# Patient Record
Sex: Female | Born: 1965 | Race: White | Hispanic: No | Marital: Married | Smoking: Former smoker
Health system: Southern US, Community
[De-identification: ages and names within clinical notes are randomized; demographics above are authoritative.]

## PROBLEM LIST (undated history)

## (undated) DIAGNOSIS — T8859XA Other complications of anesthesia, initial encounter: Secondary | ICD-10-CM

## (undated) DIAGNOSIS — T4145XA Adverse effect of unspecified anesthetic, initial encounter: Secondary | ICD-10-CM

## (undated) DIAGNOSIS — E785 Hyperlipidemia, unspecified: Secondary | ICD-10-CM

## (undated) DIAGNOSIS — Z9689 Presence of other specified functional implants: Secondary | ICD-10-CM

## (undated) DIAGNOSIS — G4730 Sleep apnea, unspecified: Secondary | ICD-10-CM

## (undated) DIAGNOSIS — I639 Cerebral infarction, unspecified: Secondary | ICD-10-CM

## (undated) DIAGNOSIS — G43909 Migraine, unspecified, not intractable, without status migrainosus: Secondary | ICD-10-CM

## (undated) DIAGNOSIS — Z9889 Other specified postprocedural states: Secondary | ICD-10-CM

## (undated) DIAGNOSIS — K219 Gastro-esophageal reflux disease without esophagitis: Secondary | ICD-10-CM

## (undated) DIAGNOSIS — I749 Embolism and thrombosis of unspecified artery: Secondary | ICD-10-CM

## (undated) DIAGNOSIS — R112 Nausea with vomiting, unspecified: Secondary | ICD-10-CM

## (undated) DIAGNOSIS — F17200 Nicotine dependence, unspecified, uncomplicated: Secondary | ICD-10-CM

## (undated) DIAGNOSIS — I2510 Atherosclerotic heart disease of native coronary artery without angina pectoris: Secondary | ICD-10-CM

## (undated) HISTORY — PX: SPINAL CORD STIMULATOR IMPLANT: SHX2422

## (undated) HISTORY — DX: Gastro-esophageal reflux disease without esophagitis: K21.9

## (undated) HISTORY — PX: TUBAL LIGATION: SHX77

## (undated) HISTORY — PX: CARDIAC CATHETERIZATION: SHX172

## (undated) HISTORY — PX: CHOLECYSTECTOMY: SHX55

## (undated) HISTORY — DX: Migraine, unspecified, not intractable, without status migrainosus: G43.909

## (undated) HISTORY — PX: SHOULDER ARTHROSCOPY: SHX128

## (undated) HISTORY — DX: Nicotine dependence, unspecified, uncomplicated: F17.200

## (undated) HISTORY — PX: PATENT FORAMEN OVALE CLOSURE: SHX2181

## (undated) HISTORY — DX: Hyperlipidemia, unspecified: E78.5

---

## 2001-07-03 ENCOUNTER — Emergency Department (HOSPITAL_COMMUNITY): Admission: EM | Admit: 2001-07-03 | Discharge: 2001-07-04 | Payer: Self-pay | Admitting: *Deleted

## 2001-07-03 ENCOUNTER — Encounter: Payer: Self-pay | Admitting: *Deleted

## 2001-10-12 ENCOUNTER — Emergency Department (HOSPITAL_COMMUNITY): Admission: EM | Admit: 2001-10-12 | Discharge: 2001-10-12 | Payer: Self-pay | Admitting: *Deleted

## 2001-12-22 ENCOUNTER — Emergency Department (HOSPITAL_COMMUNITY): Admission: EM | Admit: 2001-12-22 | Discharge: 2001-12-22 | Payer: Self-pay | Admitting: Emergency Medicine

## 2002-04-09 ENCOUNTER — Emergency Department (HOSPITAL_COMMUNITY): Admission: EM | Admit: 2002-04-09 | Discharge: 2002-04-09 | Payer: Self-pay | Admitting: Internal Medicine

## 2002-04-12 ENCOUNTER — Ambulatory Visit (HOSPITAL_COMMUNITY): Admission: RE | Admit: 2002-04-12 | Discharge: 2002-04-12 | Payer: Self-pay | Admitting: General Surgery

## 2002-04-12 ENCOUNTER — Emergency Department (HOSPITAL_COMMUNITY): Admission: EM | Admit: 2002-04-12 | Discharge: 2002-04-12 | Payer: Self-pay | Admitting: *Deleted

## 2002-08-04 ENCOUNTER — Emergency Department (HOSPITAL_COMMUNITY): Admission: EM | Admit: 2002-08-04 | Discharge: 2002-08-05 | Payer: Self-pay | Admitting: *Deleted

## 2002-08-04 ENCOUNTER — Encounter: Payer: Self-pay | Admitting: *Deleted

## 2003-03-21 ENCOUNTER — Emergency Department (HOSPITAL_COMMUNITY): Admission: EM | Admit: 2003-03-21 | Discharge: 2003-03-21 | Payer: Self-pay | Admitting: *Deleted

## 2003-03-21 ENCOUNTER — Encounter: Payer: Self-pay | Admitting: *Deleted

## 2003-04-11 ENCOUNTER — Ambulatory Visit (HOSPITAL_COMMUNITY): Admission: RE | Admit: 2003-04-11 | Discharge: 2003-04-11 | Payer: Self-pay | Admitting: Family Medicine

## 2003-04-20 ENCOUNTER — Ambulatory Visit (HOSPITAL_COMMUNITY): Admission: RE | Admit: 2003-04-20 | Discharge: 2003-04-20 | Payer: Self-pay | Admitting: Internal Medicine

## 2003-04-20 HISTORY — PX: ESOPHAGOGASTRODUODENOSCOPY: SHX1529

## 2003-05-18 ENCOUNTER — Ambulatory Visit (HOSPITAL_COMMUNITY): Admission: RE | Admit: 2003-05-18 | Discharge: 2003-05-18 | Payer: Self-pay | Admitting: Internal Medicine

## 2003-07-24 ENCOUNTER — Ambulatory Visit (HOSPITAL_COMMUNITY): Admission: RE | Admit: 2003-07-24 | Discharge: 2003-07-24 | Payer: Self-pay | Admitting: Otolaryngology

## 2003-09-07 ENCOUNTER — Emergency Department (HOSPITAL_COMMUNITY): Admission: EM | Admit: 2003-09-07 | Discharge: 2003-09-07 | Payer: Self-pay | Admitting: Emergency Medicine

## 2003-09-17 ENCOUNTER — Ambulatory Visit (HOSPITAL_BASED_OUTPATIENT_CLINIC_OR_DEPARTMENT_OTHER): Admission: RE | Admit: 2003-09-17 | Discharge: 2003-09-17 | Payer: Self-pay | Admitting: Otolaryngology

## 2004-02-12 ENCOUNTER — Emergency Department (HOSPITAL_COMMUNITY): Admission: EM | Admit: 2004-02-12 | Discharge: 2004-02-12 | Payer: Self-pay | Admitting: Emergency Medicine

## 2004-02-22 ENCOUNTER — Ambulatory Visit (HOSPITAL_COMMUNITY): Admission: RE | Admit: 2004-02-22 | Discharge: 2004-02-22 | Payer: Self-pay | Admitting: Internal Medicine

## 2004-05-20 ENCOUNTER — Ambulatory Visit (HOSPITAL_COMMUNITY): Admission: RE | Admit: 2004-05-20 | Discharge: 2004-05-20 | Payer: Self-pay | Admitting: Orthopaedic Surgery

## 2004-05-20 ENCOUNTER — Inpatient Hospital Stay (HOSPITAL_COMMUNITY): Admission: AD | Admit: 2004-05-20 | Discharge: 2004-05-24 | Payer: Self-pay | Admitting: Family Medicine

## 2004-06-04 ENCOUNTER — Encounter (HOSPITAL_COMMUNITY): Admission: RE | Admit: 2004-06-04 | Discharge: 2004-07-04 | Payer: Self-pay | Admitting: Orthopaedic Surgery

## 2004-06-15 DIAGNOSIS — Z9689 Presence of other specified functional implants: Secondary | ICD-10-CM

## 2004-06-15 HISTORY — DX: Presence of other specified functional implants: Z96.89

## 2004-07-11 ENCOUNTER — Emergency Department (HOSPITAL_COMMUNITY): Admission: EM | Admit: 2004-07-11 | Discharge: 2004-07-11 | Payer: Self-pay | Admitting: Emergency Medicine

## 2004-08-06 ENCOUNTER — Ambulatory Visit (HOSPITAL_COMMUNITY): Admission: RE | Admit: 2004-08-06 | Discharge: 2004-08-06 | Payer: Self-pay | Admitting: Cardiology

## 2004-08-08 ENCOUNTER — Ambulatory Visit (HOSPITAL_COMMUNITY): Admission: RE | Admit: 2004-08-08 | Discharge: 2004-08-08 | Payer: Self-pay | Admitting: Cardiology

## 2004-08-08 ENCOUNTER — Encounter (INDEPENDENT_AMBULATORY_CARE_PROVIDER_SITE_OTHER): Payer: Self-pay | Admitting: Cardiology

## 2004-09-22 ENCOUNTER — Encounter: Admission: RE | Admit: 2004-09-22 | Discharge: 2004-09-22 | Payer: Self-pay | Admitting: Neurology

## 2004-11-18 ENCOUNTER — Ambulatory Visit (HOSPITAL_COMMUNITY): Admission: RE | Admit: 2004-11-18 | Discharge: 2004-11-19 | Payer: Self-pay | Admitting: Cardiology

## 2004-11-29 ENCOUNTER — Observation Stay (HOSPITAL_COMMUNITY): Admission: EM | Admit: 2004-11-29 | Discharge: 2004-11-30 | Payer: Self-pay | Admitting: Emergency Medicine

## 2004-12-07 ENCOUNTER — Inpatient Hospital Stay (HOSPITAL_COMMUNITY): Admission: EM | Admit: 2004-12-07 | Discharge: 2004-12-12 | Payer: Self-pay | Admitting: Emergency Medicine

## 2004-12-09 ENCOUNTER — Encounter (INDEPENDENT_AMBULATORY_CARE_PROVIDER_SITE_OTHER): Payer: Self-pay | Admitting: Cardiology

## 2004-12-30 ENCOUNTER — Emergency Department (HOSPITAL_COMMUNITY): Admission: EM | Admit: 2004-12-30 | Discharge: 2004-12-30 | Payer: Self-pay | Admitting: *Deleted

## 2005-01-19 ENCOUNTER — Encounter: Admission: RE | Admit: 2005-01-19 | Discharge: 2005-01-19 | Payer: Self-pay | Admitting: Neurology

## 2005-06-24 ENCOUNTER — Ambulatory Visit: Payer: Self-pay | Admitting: Internal Medicine

## 2005-07-01 ENCOUNTER — Ambulatory Visit (HOSPITAL_COMMUNITY): Admission: RE | Admit: 2005-07-01 | Discharge: 2005-07-01 | Payer: Self-pay | Admitting: Internal Medicine

## 2005-07-07 ENCOUNTER — Ambulatory Visit (HOSPITAL_COMMUNITY): Admission: RE | Admit: 2005-07-07 | Discharge: 2005-07-07 | Payer: Self-pay | Admitting: Family Medicine

## 2005-08-04 ENCOUNTER — Ambulatory Visit (HOSPITAL_COMMUNITY): Admission: RE | Admit: 2005-08-04 | Discharge: 2005-08-04 | Payer: Self-pay | Admitting: Internal Medicine

## 2005-08-04 ENCOUNTER — Ambulatory Visit: Payer: Self-pay | Admitting: Internal Medicine

## 2005-08-04 HISTORY — PX: COLONOSCOPY: SHX174

## 2005-08-19 ENCOUNTER — Ambulatory Visit (HOSPITAL_COMMUNITY): Admission: RE | Admit: 2005-08-19 | Discharge: 2005-08-19 | Payer: Self-pay | Admitting: Neurology

## 2005-09-02 ENCOUNTER — Ambulatory Visit: Payer: Self-pay | Admitting: Internal Medicine

## 2006-05-03 ENCOUNTER — Emergency Department (HOSPITAL_COMMUNITY): Admission: EM | Admit: 2006-05-03 | Discharge: 2006-05-03 | Payer: Self-pay | Admitting: Emergency Medicine

## 2006-10-02 ENCOUNTER — Inpatient Hospital Stay (HOSPITAL_COMMUNITY): Admission: EM | Admit: 2006-10-02 | Discharge: 2006-10-05 | Payer: Self-pay | Admitting: Emergency Medicine

## 2006-11-04 ENCOUNTER — Emergency Department (HOSPITAL_COMMUNITY): Admission: EM | Admit: 2006-11-04 | Discharge: 2006-11-04 | Payer: Self-pay | Admitting: Emergency Medicine

## 2007-03-12 ENCOUNTER — Emergency Department (HOSPITAL_COMMUNITY): Admission: EM | Admit: 2007-03-12 | Discharge: 2007-03-13 | Payer: Self-pay | Admitting: Emergency Medicine

## 2007-07-11 ENCOUNTER — Emergency Department (HOSPITAL_COMMUNITY): Admission: EM | Admit: 2007-07-11 | Discharge: 2007-07-12 | Payer: Self-pay | Admitting: Emergency Medicine

## 2007-11-14 ENCOUNTER — Emergency Department (HOSPITAL_COMMUNITY): Admission: EM | Admit: 2007-11-14 | Discharge: 2007-11-14 | Payer: Self-pay | Admitting: Emergency Medicine

## 2007-12-13 ENCOUNTER — Ambulatory Visit (HOSPITAL_COMMUNITY): Admission: RE | Admit: 2007-12-13 | Discharge: 2007-12-13 | Payer: Self-pay | Admitting: Internal Medicine

## 2008-02-18 ENCOUNTER — Emergency Department (HOSPITAL_COMMUNITY): Admission: EM | Admit: 2008-02-18 | Discharge: 2008-02-18 | Payer: Self-pay | Admitting: Emergency Medicine

## 2008-03-20 ENCOUNTER — Encounter
Admission: RE | Admit: 2008-03-20 | Discharge: 2008-06-18 | Payer: Self-pay | Admitting: Physical Medicine & Rehabilitation

## 2008-04-16 ENCOUNTER — Emergency Department (HOSPITAL_COMMUNITY): Admission: EM | Admit: 2008-04-16 | Discharge: 2008-04-16 | Payer: Self-pay | Admitting: Emergency Medicine

## 2008-04-20 ENCOUNTER — Observation Stay (HOSPITAL_COMMUNITY): Admission: EM | Admit: 2008-04-20 | Discharge: 2008-04-22 | Payer: Self-pay | Admitting: Emergency Medicine

## 2008-08-09 ENCOUNTER — Emergency Department (HOSPITAL_COMMUNITY): Admission: EM | Admit: 2008-08-09 | Discharge: 2008-08-09 | Payer: Self-pay | Admitting: Emergency Medicine

## 2009-08-22 ENCOUNTER — Emergency Department (HOSPITAL_COMMUNITY): Admission: EM | Admit: 2009-08-22 | Discharge: 2009-08-22 | Payer: Self-pay | Admitting: Emergency Medicine

## 2009-12-18 ENCOUNTER — Ambulatory Visit (HOSPITAL_COMMUNITY): Admission: RE | Admit: 2009-12-18 | Discharge: 2009-12-18 | Payer: Self-pay | Admitting: Obstetrics & Gynecology

## 2010-07-05 ENCOUNTER — Encounter: Payer: Self-pay | Admitting: Family Medicine

## 2010-08-31 LAB — URINALYSIS, ROUTINE W REFLEX MICROSCOPIC
Bilirubin Urine: NEGATIVE
Glucose, UA: NEGATIVE mg/dL
Specific Gravity, Urine: 1.015 (ref 1.005–1.030)
pH: 5.5 (ref 5.0–8.0)

## 2010-08-31 LAB — CBC
Hemoglobin: 14.9 g/dL (ref 12.0–15.0)
MCV: 90.6 fL (ref 78.0–100.0)
Platelets: 289 10*3/uL (ref 150–400)
RBC: 4.84 MIL/uL (ref 3.87–5.11)
WBC: 15.7 10*3/uL — ABNORMAL HIGH (ref 4.0–10.5)

## 2010-08-31 LAB — COMPREHENSIVE METABOLIC PANEL
AST: 16 U/L (ref 0–37)
Albumin: 4.5 g/dL (ref 3.5–5.2)
Alkaline Phosphatase: 41 U/L (ref 39–117)
CO2: 21 mEq/L (ref 19–32)
Chloride: 106 mEq/L (ref 96–112)
GFR calc Af Amer: >60 mL/min (ref >60)
GFR calc non Af Amer: >60 mL/min (ref >60)
Potassium: 4.1 mEq/L (ref 3.5–5.1)
Total Bilirubin: 0.5 mg/dL (ref 0.3–1.2)

## 2010-08-31 LAB — URINE MICROSCOPIC-ADD ON

## 2010-10-01 ENCOUNTER — Emergency Department (HOSPITAL_COMMUNITY): Payer: BLUE CROSS/BLUE SHIELD

## 2010-10-01 ENCOUNTER — Emergency Department (HOSPITAL_COMMUNITY)
Admission: EM | Admit: 2010-10-01 | Discharge: 2010-10-01 | Disposition: A | Discharge status: Home / Self Care | Payer: BLUE CROSS/BLUE SHIELD | Attending: Emergency Medicine | Admitting: Emergency Medicine

## 2010-10-01 DIAGNOSIS — E7800 Pure hypercholesterolemia, unspecified: Secondary | ICD-10-CM | POA: Insufficient documentation

## 2010-10-01 DIAGNOSIS — IMO0001 Reserved for inherently not codable concepts without codable children: Secondary | ICD-10-CM | POA: Insufficient documentation

## 2010-10-01 DIAGNOSIS — Z86711 Personal history of pulmonary embolism: Secondary | ICD-10-CM | POA: Insufficient documentation

## 2010-10-01 DIAGNOSIS — R0602 Shortness of breath: Secondary | ICD-10-CM | POA: Insufficient documentation

## 2010-10-01 DIAGNOSIS — I4891 Unspecified atrial fibrillation: Secondary | ICD-10-CM | POA: Insufficient documentation

## 2010-10-01 DIAGNOSIS — M25519 Pain in unspecified shoulder: Secondary | ICD-10-CM | POA: Insufficient documentation

## 2010-10-01 DIAGNOSIS — R079 Chest pain, unspecified: Secondary | ICD-10-CM | POA: Insufficient documentation

## 2010-10-01 LAB — DIFFERENTIAL
Basophils Absolute: 0.1 10*3/uL (ref 0.0–0.1)
Basophils Relative: 1 % (ref 0–1)
Eosinophils Absolute: 0.6 10*3/uL (ref 0.0–0.7)
Monocytes Relative: 6 % (ref 3–12)
Neutrophils Relative %: 62 % (ref 43–77)

## 2010-10-01 LAB — BASIC METABOLIC PANEL
BUN: 14 mg/dL (ref 6–23)
Calcium: 9.8 mg/dL (ref 8.4–10.5)
Chloride: 101 mEq/L (ref 96–112)
Creatinine, Ser: 0.61 mg/dL (ref 0.4–1.2)
GFR calc non Af Amer: >60 mL/min (ref >60)

## 2010-10-01 LAB — POCT CARDIAC MARKERS
CKMB, poc: <1 ng/mL — ABNORMAL LOW (ref 1.0–8.0)
Myoglobin, poc: 39.8 ng/mL (ref 12–200)
Troponin i, poc: <0.05 ng/mL (ref 0.00–0.09)

## 2010-10-01 LAB — CBC
MCH: 30.6 pg (ref 26.0–34.0)
MCHC: 34.8 g/dL (ref 30.0–36.0)
Platelets: 274 10*3/uL (ref 150–400)
RBC: 5.33 MIL/uL — ABNORMAL HIGH (ref 3.87–5.11)

## 2010-10-01 MED ORDER — IOHEXOL 350 MG/ML SOLN: 100.0000 mL | Freq: Once | INTRAVENOUS | Status: DC | PRN

## 2010-10-10 ENCOUNTER — Encounter: Payer: Self-pay | Admitting: Orthopedic Surgery

## 2010-10-16 ENCOUNTER — Encounter: Payer: Self-pay | Admitting: Orthopedic Surgery

## 2010-10-16 ENCOUNTER — Ambulatory Visit (INDEPENDENT_AMBULATORY_CARE_PROVIDER_SITE_OTHER): Payer: BLUE CROSS/BLUE SHIELD | Admitting: Orthopedic Surgery

## 2010-10-16 VITALS — Ht 63.0 in | Wt 147.0 lb

## 2010-10-16 DIAGNOSIS — M25519 Pain in unspecified shoulder: Secondary | ICD-10-CM

## 2010-10-16 NOTE — Progress Notes (Signed)
Visit cancelled due to insufficient previous record

## 2010-10-28 ENCOUNTER — Emergency Department (HOSPITAL_COMMUNITY)
Admission: EM | Admit: 2010-10-28 | Discharge: 2010-10-29 | Disposition: A | Discharge status: Home / Self Care | Payer: BLUE CROSS/BLUE SHIELD | Attending: Emergency Medicine | Admitting: Emergency Medicine

## 2010-10-28 DIAGNOSIS — M25519 Pain in unspecified shoulder: Secondary | ICD-10-CM | POA: Insufficient documentation

## 2010-10-28 DIAGNOSIS — E7800 Pure hypercholesterolemia, unspecified: Secondary | ICD-10-CM | POA: Insufficient documentation

## 2010-10-28 DIAGNOSIS — Z86711 Personal history of pulmonary embolism: Secondary | ICD-10-CM | POA: Insufficient documentation

## 2010-10-28 DIAGNOSIS — Z86718 Personal history of other venous thrombosis and embolism: Secondary | ICD-10-CM | POA: Insufficient documentation

## 2010-10-28 NOTE — Discharge Summary (Signed)
[name], Jessica Shaw             ACCOUNT NO.:  [id number]   MEDICAL RECORD NO.:  [id number]          PATIENT TYPE:  OBV   LOCATION:  2020                         FACILITY:  [facility]   PHYSICIAN:  Cristy Hilts. Jacinto Halim, MD       DATE OF BIRTH:  1965/12/25   DATE OF ADMISSION:  04/20/2008  DATE OF DISCHARGE:  04/22/2008                               DISCHARGE SUMMARY   DISCHARGE DIAGNOSES:  1. Palpitations.  2. History of patent foramen ovale closure.  3. History of transient ischemic attack.  4. History of left lower extremity deep vein thrombosis.  5. History of irritable bowel syndrome.  6. History of gastroesophageal reflux disease.  7. History of paroxysmal atrial fibrillation and atrial tachycardia.   LABORATORY DATA:  Hemoglobin 13.6, hematocrit 40.7, WBCs15, and  platelets 298.  Sodium 137, potassium 5.0, chloride 105, CO2 26, BUN 7,  and creatinine 0.68.  Total cholesterol 251, triglycerides 202, HDL 31,  and LDL 180.  AST is 28, ALT is 33, and free T4 is 0.96.  Chest x-ray, I  did not see in the chart at the time of this dictation.  Of note on her  telemetry, she did appear to have runs of sinus tach, rate up to 125 on  April 21, 2008.   HOSPITAL COURSE:  [name] is a 45 year old female patient  who came to Emergency Room, who appears with palpitations.  She  apparently previously has had PAF and atrial tachycardia and a PFO  closure in 2006.  She has been off Coumadin, beta blockers, and Rythmol  and was only on diltiazem 30 mg q.6 h. as needed when she came to the  hospital.  Apparently for the last couple of months, she has been having  problems and she started smoking again 3 to 4 months ago.  She was seen  by Dr. Domingo Sep in the Emergency Room.  She decided to admit her and put  her on beta blockers and diltiazem.  Over the next few days, her heart  rate settled down and did have palpitations settle down.  On the day of  discharge April 21, 2008, her blood  pressure was 102/71, heart rate  93, respirations 18, she was afebrile and room air O2 sats were 94%.  She was considered stable for discharge home.   DISCHARGE MEDICATIONS:  1. Metoprolol succinate 50 mg daily in the morning.  2. Diltiazem CD 120 mg daily at bedtime.  3. Xanax 0.25 mg twice a day.  4. Amitriptyline 50 mg at bedtime.  5. Celexa 40 mg a day.  6. Crestor 10 mg daily at bedtime.   She was told to stop smoking.  She will follow up with Dr. Jacinto Halim in 1 to  2 weeks.  Our office will call up for an appointment to see him.      Lezlie Octave, N.P.      Cristy Hilts. Jacinto Halim, MD  Electronically Signed    BB/MEDQ  D:  06/13/2008  T:  06/14/2008  Job:  [id number]   cc:   Kirk Ruths, [name].

## 2010-10-31 NOTE — Consult Note (Signed)
Jessica Shaw, Jessica Shaw             ACCOUNT NO.:  [id number]   MEDICAL RECORD NO.:  [id number]          PATIENT TYPE:  EMS   LOCATION:  MAJO                         FACILITY:  [facility]   PHYSICIAN:  Marlan Palau, [name].  DATE OF BIRTH:  1965/07/15   DATE OF CONSULTATION:  12/30/2004  DATE OF DISCHARGE:  12/30/2004                                   CONSULTATION   HISTORY OF PRESENT ILLNESS:  Jessica Shaw is a 45 year old right-handed  white female born [birth year], with a history of patent foramen ovale.  Has had transient events of slight confusion in the past.  The patient was  felt to have TIA events and was felt to be a candidate for closure of the  PFO, which was done on November 18, 2004.  The patient had a CardioSeal  interatrial septal defect closure device.  The patient, however, has had  episodes of tachyarrhythmias, atrial fibrillation, with rates in the 160  range.  The patient did have an episode of atrial fibrillation following the  closure of the PFO requiring cardioversion.  The patient came back in for a  transesophageal echocardiogram, which was performed on December 09, 2004, and  was found to have a mobile clot within the left atrium.  The patient has  been converted to Coumadin but recently within the last 24 hours was told  the protime was too high, was told to hold the Coumadin at this point.  The  patient, however, comes into the emergency room today after noting onset of  an event that occurred around 12 noon with sudden onset of left face and arm  and leg numbness, some weakness.  The patient was able to ambulate.  The  patient felt that her tongue and throat on the left side were also numb, had  some slurred speech, denied any visual field changes.  The patient had  developed a headache around 10:30 a.m.  The patient has had resolution of  most of her left-sided sensory symptoms, and the headache has resolved as  well.  The patient denied any visual field changes.   The patient does have a  history of migraine with nausea and vomiting that occurs once or twice a  week, but these headaches have never been associated with similar episodes  of numbness or weakness.  The patient has had a CT scan of the head that is  unremarkable, comes in for evaluation of the above event.  The patient has  had blood work in the emergency room revealing an INR of 2.3.   PAST MEDICAL HISTORY:  1.  History of transient event of left-sided numbness, possible TIA today.  2.  Left biceps tendon tear repaired.  3.  Frozen shoulder on the left.  4.  Bilateral tubal ligation.  5.  Gallbladder resection.  6.  Sinus surgery.  7.  Patent foramen ovale, status post closure.  8.  Left atrial clot, on Coumadin.  9.  History of tachyarrhythmia, atrial fibrillation, followed by Dr. Jacinto Halim.   CURRENT MEDICATIONS:  1.  Diltiazem 180 mg daily.  2.  Rythmol 225 mg q.8h.  3.  Toprol 25 mg in the morning, 50 mg in the evening.  4.  Coumadin 5 alternating with 7 mg every other day.  5.  Digoxin, unknown dose daily.   The patient does not smoke or drink.   SOCIAL HISTORY:  The patient is married, lives in the [place] area.  Has  two children, who are alive and well.  The patient is on disability  currently.   FAMILY MEDICAL HISTORY:  Notable in that the father is alive with diabetes  and hypertension.  Mother died with cancer.  The patient has 18 siblings.  Two brothers have diabetes.  All the rest of the siblings are in relatively  good health.   REVIEW OF SYSTEMS:  Notable for no recent fevers, chills.  The patient  denies visual field changes, swallowing problems, shortness of breath, neck  pain, chest pain.  Has not had any palpitations of the heart recently.  Denies nausea or vomiting recently.  Denies any problems controlling the  bowels or the bladder.  Denies dizziness, blackout episodes.   PHYSICAL EXAMINATION:  VITAL SIGNS:  Blood pressure is 119/82, heart rate  94,  respiratory rate 20, temperature afebrile.  GENERAL:  The patient is a fairly well-developed white female who is alert  and cooperative at the time of examination.  HEENT:  Head is atraumatic.  Eyes:  Pupils equal, round and reactive to  light.  Discs flat bilaterally.  NECK:  Supple, no carotid bruits noted.  RESPIRATORY:  Clear.  CARDIOVASCULAR:  Regular rate and rhythm, no obvious murmurs or rubs noted.  EXTREMITIES:  Without significant edema.  NEUROLOGIC:  Cranial nerves as above.  Facial symmetry is present.  The  patient has decreased pinprick sensation in the left face as compared to the  right lower face.  Has again good strength of the facial muscles and the  muscles of head turning and shoulder shrug bilaterally.  Speech is well-  enunciated and not aphasic.  Extraocular movements were full.  Visual fields  were full.  Motor testing reveals 5/5 strength in all fours.  Good symmetric  motor tone is noted bilaterally.  Sensory testing reveals some decrease in  pinprick sensation of the left arm as compared to the right and otherwise  symmetric in her legs.  Vibratory sensation is symmetric throughout.  The  patient has fair finger-nose-finger, heel-to-shin bilaterally.  The patient  has incomplete abduction of the left arm due to physical restriction across  the shoulder joint.  The patient was not ambulated.  Deep tendon reflexes  symmetric and normal, toes neutral bilaterally.   Laboratory values notable for sodium 136, potassium 4.0, chloride of 103,  CO2 of 99, BUN of 13, creatinine 0.8.  INR of 2.3.  White count of 12.0,  hemoglobin of 13.5, hematocrit 40.4, platelets of 415.  A pH of 7.34.   IMPRESSION:  1.  Episode of left-sided hemisensory deficit, probable transient ischemic      attack event, likely cardioembolic.  2.  Patent foramen ovale, status post closure.  3.  Recent demonstration of left atrial clot, on Coumadin. 4.  History of tachyarrhythmias, atrial  fibrillation.   The patient has had recent documented sources of cardiogenic emboli, is on  Coumadin for this reason.  The patient may have had a cardiogenic event at  this point.  I have recommended the patient come in overnight for evaluation  and some workup.  The patient, however, does not  wish to enter the hospital  at this time.  The patient, however, has  agreed to at least undergo an MRI scan of the brain with MRI angiogram.  We  will perform this at this point.  The patient may need to go on low-dose  aspirin for the next several weeks taking 81 mg a day.  The patient will  need to follow up with Dr. Pearlean Brownie and Dr. Jacinto Halim.       CKW/MEDQ  D:  12/30/2004  T:  12/31/2004  Job:  [id number]   cc:   Cristy Hilts. Jacinto Halim, MD  [address]  [place]  [place] [address]  Fax: [phone]

## 2010-10-31 NOTE — Cardiovascular Report (Signed)
Jessica Shaw, Jessica Shaw             ACCOUNT NO.:  [id number]   MEDICAL RECORD NO.:  [id number]          PATIENT TYPE:  OIB   LOCATION:  2899                         FACILITY:  [facility]   PHYSICIAN:  Cristy Hilts. Jacinto Halim, MD       DATE OF BIRTH:  1965/06/26   DATE OF PROCEDURE:  11/18/2004  DATE OF DISCHARGE:                              CARDIAC CATHETERIZATION   REFERRING PHYSICIAN:  Dr. Delia Heady   PROCTORING PHYSICIAN:  Dr. Shirlyn Goltz   PROCEDURES PERFORMED:  1.  Intracardiac echocardiogram.  2.  Closure of the patent foramen ovale using 28 mm CardioSEAL device.   INDICATIONS:  Jessica Shaw is a 45 year old Caucasian female with a  history of patent foramen ovale with frequent TIA-like symptoms in the form  of dizziness, confusion, dysarthria.  In spite of her being on Coumadin, she  continued to have these recurrent episodes.  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  Given this, after obtaining informed consent and after explaining  alternatives, risks, and benefits, she was brought to the catheterization  suite for PFO closure.   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  The shunting was also  confirmed by color Doppler examination.   Post CardioSEAL deployment the CardioSEAL was appropriately sized and  excellent closure of the PFO was confirmed through the ICE.  Double contrast  injection was also performed and she did have still presence of shunting  which most probably will close in the next coming few weeks.   CardioSEAL deployment.  After measuring the PFO with the measuring balloon a  28 mm CardioSEAL device was easily deployed into the PFO with the  sandwiching the atrial septal aneurysm and the PFO.  Excellent device  apposition and  positioning was confirmed by radiographic imaging and also by  ICE.   RECOMMENDATIONS:  Patient will be now continued on aspirin and Plavix for a  period of at least six months, probably a year.   COMPLICATIONS:  Patient had very transient ST elevation prior to the  deployment of the CardioSEAL device.  This immediately resolved and patient  was asymptomatic with no hemodynamic compromise.   Post CardioSEAL deployment patient had an episode of atrial fibrillation and  after observing the atrial fibrillation for greater than 20-30 minutes it  was decided to proceed with electrical cardioversion.   DIRECT CURRENT CARDIOVERSION:  Using initial Versed and Dilaudid sedation 50  joules of direct current cardioversion synchronized direct current  cardioversion was performed but because of persistent of atrial fibrillation  100 joules of direct current cardioversion was performed with conversion of  atrial fibrillation to normal sinus rhythm.  Patient tolerated procedure.   Overall, no immediate complications noted.  Patient tolerated the procedure  well.   TECHNIQUE OF THE PROCEDURE:  Using an 8-French left femoral venous access  and a 6-French right femoral venous access an ICE probe  was easily advanced  through the 8-French catheter and the ICE probe was carefully positioned in  the right atrium.  The cardiac structures were visualized.  The PFO was  visualized.  Then using a 6-French multipurpose A2 catheter was advanced  through the right femoral venous access and the PFO was easily crossed and  the catheter went directly into the left upper pulmonary vein.  Then using a  0.035-inch Rosen stiff wire the wire was carefully positioned in the left  upper pulmonary vein.  The position of the wire was confirmed through the  ICE.  Then the 6-French sheath was exchanged to an 8-French sheath and a PFO  sizing balloon was advanced into the PFO under radiographic and also ICE  imaging.  The PFO  was sized and it was decided to proceed with a 28 mm  CardioSEAL device deployment.   The 8-French sheath was pulled out and an 11-French long sheath was  introduced into the right femoral vein and into the right atrium and into  the left atrium.  After this was performed the 28 mm CardioSEAL device was  carefully advanced through the sheath and after confirming that the left  atrial side of the device was open both by x-ray and also by ICE the device  was carefully pulled back and abutting the interatrial septum and the right  atrial side of the device was deployed.  Excellent sandwiching was noted.  Then the sheath was pulled back into the lower part of the right atrium and  agitated saline contrast injection was performed.  Then the sheath was  carefully pulled out and exchanged to a short 4-French right femoral venous  sheath and sutured in place.  During the procedure patient received 5000  units of heparin and ACT was greater than 250.  Overall, excellent results  were noted.  I met the patient's family and went over the procedure with  them.       JRG/MEDQ  D:  11/18/2004  T:  11/18/2004  Job:  [id number]

## 2010-10-31 NOTE — Discharge Summary (Signed)
Jessica Shaw, Jessica Shaw             ACCOUNT NO.:  [id number]   MEDICAL RECORD NO.:  [id number]          PATIENT TYPE:  INP   LOCATION:  A319                          FACILITY:  [facility]   PHYSICIAN:  Kirk Ruths, [name].DATE OF BIRTH:  Oct 22, 1965   DATE OF ADMISSION:  05/20/2004  DATE OF DISCHARGE:  12/10/2005LH                                 DISCHARGE SUMMARY   DISCHARGE DIAGNOSES:  1.  Thrombosis of left celiac vein.  2.  Status post multiple surgical procedures on the left shoulder.  3.  History of migraine headaches.   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  The patient had undergone multiple procedures on her left  shoulder over the last several months by Dr. Ophelia Charter.  Dr. Nanetta Batty was  consulted by telephone regarding treatment of _thrombosis_________problem  since Dr. Allyson Sabal is a vascular specialist.  He recommended anticoagulation  and heparinization.  She was admitted, begun on IV heparin, as well as  Coumadin.  The patient was treated with moist compresses to her left  extremity.  There was no evidence of infection.  The patient also received  passive range of motion to her shoulder during her stay.  The patient's  white count was 11,000 on admission, with a hemoglobin of 14.  Her  electrolytes on admission showed a potassium slightly low at 3.  This was  replaced to 3.8.  Her sodium did dip to 128, no significance of that.  The  patient received 2 mg of Coumadin nightly twice.  After the first dose, INR  was still 1, after the second dose it was 2.  The patient was adequately  anticoagulated.  She will be discharged home on Coumadin.  We will start low  2 mg daily, have her prothrombin time checked in two days in my office.  She  is also given a prescription for Xanax, and she is to continue Topamax and  Zoloft, as well as her passive range  of motion to her shoulder.  The patient  is to call Dr. Annell Greening office on Monday for appointment for  __________evaluation and suture removal.     Will   WMM/MEDQ  D:  05/24/2004  T:  05/24/2004  Job:  [id number]   cc:   Veverly Fells. Ophelia Charter, [name].  [address] [place], [place] [address]  Fax: [phone]

## 2010-10-31 NOTE — Op Note (Signed)
NAMETAMECKA, MILHAM                       ACCOUNT NO.:  [id number]   MEDICAL RECORD NO.:  [id number]                   PATIENT TYPE:  AMB   LOCATION:  DSC                                  FACILITY:  [facility]   PHYSICIAN:  Karol T. Lazarus Salines, [name].              DATE OF BIRTH:  01/21/1966   DATE OF PROCEDURE:  09/17/2003  DATE OF DISCHARGE:                                 OPERATIVE REPORT   PREOPERATIVE DIAGNOSIS:  Severe nasal septal deviation with obstruction and  headache.   POSTOPERATIVE DIAGNOSIS:  Severe nasal septal deviation with obstruction and  headache.   PROCEDURE PERFORMED:  Nasal septoplasty.  Bilateral SMR inferior turbinates.   SURGEON:  Gloris Manchester. Lazarus Salines, [name].   ANESTHESIA:  General orotracheal.   BLOOD LOSS:  Minimal.   COMPLICATIONS:  None.   FINDINGS:  A leftward deviated nasal bony and cartilaginous dorsum concave  to the right with slight depression of the super tip septal cartilage.  Internally, a severe buckle of the nasal septum at the maxillary crest with  deviation along the maxillary crest to the right and a very prominent right  chondro- ethmoid spur abutting the lateral wall of the nose.  Compensatory  hypertrophy of the left inferior turbinate.  No polyps or active drainage.   PROCEDURE:  With the patient in the comfortable supine position, general  orotracheal anesthesia was induced without difficulty.  At an appropriate  level, the patient was placed in a slight sitting position.  Saline-  moistened throat pack was placed.  Nasal vibrissae were trimmed.  Cocaine  crystals, 200 mg total were applied on cotton carriers to the anterior  ethmoid and sphenopalatine ganglion regions on both sides.  Cocaine  solution, 160 mg total was applied on 0.5 x 3 inch cottonoids to both sides  of the septal mucosa.  Finally, 1% Xylocaine with 1:100,000 epinephrine, 11  cc total was infiltrated into the interior floor of the nose on both sides,  into the nasal  spine region, into the membranous columella, and into the  submucoperichondrial plane of the septum on both sides.  Several minutes  were allowed for hemostasis to take effect.  A sterile preparation and  draping of the mid face was accomplished.   The materials were removed from the nose and throat to be intact and correct  in number.  The findings were as described above.  A full left-sided  hemitransfixion incision was sharply executed and carried down to the caudal  edge of the quadrangular cartilage and continued down to a floor incision.  A small right-sided floor incision was executed.  Floor tunnels were  generated on both sides, carried over to the vomer posteriorly and brought  forward.  The submucoperichondrial plane of the left septum was elevated and  taken back under the perpendicular plate of the Ethmoid.  It was carefully  dissected out of the buckled crease.  The left septal  flap was raised  intact.  Upon elevating the flap, and communicating with the floor tunnel,  the chondro-ethmoid junction was identified and opened with the caudal  elevator.  The opposite sub mucoperiosteal plane of the perpendicular plate  was elevated.  The superior perpendicular plate was lysed with an open  Jansen-Middleton forceps.  The mid portion was dissected and delivered with  the [facility] forceps.  Small linear rents along the maxillary crest were  generated back to the chondro-ethmoid spur but no mucosa was removed.  Additional dissection of the perpendicular plate and the vomer allowed  removal of some additional rightward deviated bony septum.  At this point,  the septum was disarticulated from the maxillary crest and communicated with  the right-sided floor tunnel.  The posterior inferior corner of the  quadrangular cartilage and a small strip of inferior caudal strip was  removed where it was heavily spurred.  The maxillary crest posterior to this  point was heavily spurred to the  right and was removed with an open Leane Para-  Middleton forceps submucosally.  At this point, the septum was able to trap  door into the midline with good support at the nasal spine region.  It had a  tendency to deviate back towards the right.  Inside the septal tunnel in the  vault of the nose, the septum was separated from the upper lateral  cartilages on the left side and transmucosally the same incision was  executed on the right side.  This allowed more mobility of the septum which  was then brought into the midline and secured to the nasal spine with a  figure-of-eight 4-0 PDS suture.  Bony spicules were carefully removed and  the septal tunnel was suctioned clean.  The mucosal incisions were closed  with an interrupted 4-0 chromic suture.  The flaps were reapproximated and  including closure of the linear rent along the maxillary crest on the right  side using a quilting stitch of 4-0 plain gut.  At this point, the septum  was straight and in the midline and well supported.   Just prior to completing both sides of the septum, the inferior turbinates  were infiltrated with 3 cc each of 1% Xylocaine with 1:100,000 epinephrine.  After completing the septoplasty, beginning on the right side, the anterior  hood of the inferior turbinate was sharply incised behind the nasal valve.  The medial mucosa was incised in an up sloping fashion.  A laterally-based  flap was developed.  The turbinate was in-fractured.  Using angled turbinate  scissors, the turbinate bone and lateral mucosa was resected in a posterior  down sloping fashion, removing most of the anterior pole and leaving all of  the posterior pole intact.  A small specimen was removed from the right  side.  Bony spicules were removed for more prompt healing.  Suction cautery  was used to coagulate the cut mucosal edges.  The flap was laid back down.  The turbinate was out-fractured and this side was completed.  The left inferior  turbinate was more bulky but the same steps were executed and  mainly the incision of the anterior hood, an incision of the medial mucosa  with development of a laterally based flap, in-fracture and partial  resection of the turbinate bone and mucosa in a posterior down sloping  fashion.  Again, bony spicules were removed.  The bulbus posterior pole of  the turbinate and the cut mucosal edges were suctioned and coagulated.  The  flap was laid back down.  The turbinate was out-fractured and this side was  completed.   The 0.040 reinforced Silastic splints were fashioned and placed against the  septum and secured there to it with 3-0 nylon stitch.  Triple thickness  Telfa packs impregnated with Neosporin ointment were fashioned and placed  low in the nose on each side for septal support and turbinate hemostasis.  Hemostasis was observed.  At this point, the procedure was completed.  The  pharynx was suctioned, freeing the throat pack, was removed.  The patient  was returned to anesthesia, awakened, extubated and transferred to recovery  in stable condition.   COMMENT:  A 45 year old white female with a possible history of distant  trauma but with a residual moderate leftward septal dorsal deviation and  severe rightward septal deviation with obstruction and with possible contact  point headaches was the indication for today's procedure.  Anticipate a  routine postoperative recovery with attention  to ice, elevation, analgesia, antibiosis.  Will remove the internal packing  in one to two days, and the septal splints in seven to 10 days.  Given low  anticipated risk of postanesthetic or post surgical complications, general  outpatient venue is appropriate.                                               Gloris Manchester. Lazarus Salines, [name].    KTW/MEDQ  D:  09/17/2003  T:  09/17/2003  Job:  [id number]   cc:   Kirk Ruths, [name].  [address]  [place]  [place] [address]  Fax: [phone]

## 2010-10-31 NOTE — Op Note (Signed)
   NAME:  Jessica Shaw, Jessica Shaw                        ACCOUNT NO.:  [id number]   MEDICAL RECORD NO.:  [id number]                   PATIENT TYPE:  AMB   LOCATION:  DAY                                  FACILITY:  [facility]   PHYSICIAN:  Dalia Heading, [name].               DATE OF BIRTH:  1966-02-18   DATE OF PROCEDURE:  04/12/2002  DATE OF DISCHARGE:                                 OPERATIVE REPORT   PREOPERATIVE DIAGNOSIS:  Thrombosed hemorrhoids, multiple.   POSTOPERATIVE DIAGNOSIS:  Thrombosed hemorrhoids, multiple.   PROCEDURE:  Extensive hemorrhoidectomy.   SURGEON:  Dalia Heading, [name].   ANESTHESIA:  General.   COMPLICATIONS:  None.   SPECIMENS:  Hemorrhoidal tissue.   ESTIMATED BLOOD LOSS:  Minimal.   INDICATIONS:  The patient is a 45 year old white female who is referred for  evaluation and treatment of painful thrombosed hemorrhoids.  The risks and  benefits of the procedure,  including bleeding, infection, incontinence, and  the possibility of recurrence of the hemorrhoids, were fully explained to  the patient, who gave informed consent.   DESCRIPTION OF PROCEDURE:  The patient was placed in the lithotomy position  after general anesthesia was administered.  The perineum was prepped and  draped using the usual sterile technique with Betadine.   The patient had extensive hemorrhoidal disease along primarily the 4 o'clock  and the 7 o'clock positions.  Both internal and external hemorrhoids were  present.  She did have thrombosis of hemorrhoids at the 4 o'clock position.  A 2-0 Vicryl suture was placed at the dentate line, and the hemorrhoidal  tissue was excised in both regions.  Any bleeding was controlled using Bovie  electrocautery.  Once the hemorrhoidal tissue was excised, the mucosal edges  were reapproximated using a running 2-0 Vicryl suture.  This was done in  both areas.  Sensorcaine 0.5% was instilled into the surrounding perineum,  and the rectum was packed  with Surgicel and viscous Xylocaine.   All tape and needle counts correct at the end of the procedure.  The patient  was awakened and transferred to PACU in stable condition.                                               Dalia Heading, [name].    MAJ/MEDQ  D:  04/12/2002  T:  04/12/2002  Job:  [id number]   cc:   Madelin Rear. Sherwood Gambler, [name].  [address]  [place]  [place] [address]  Fax: [phone]

## 2010-10-31 NOTE — H&P (Signed)
NAMEMICKENZIE, STOLAR             ACCOUNT NO.:  [id number]   MEDICAL RECORD NO.:  [id number]          PATIENT TYPE:  INP   LOCATION:  A223                          FACILITY:  [facility]   PHYSICIAN:  Corrie Mckusick, [name].  DATE OF BIRTH:  [birth year]   DATE OF ADMISSION:  10/02/2006  DATE OF DISCHARGE:  LH                              HISTORY & PHYSICAL   REASON FOR ADMISSION:  A 45 year old female with a very complex medical  history including migraine headaches, history of TIAs, patent foramen  ovale closure, history of thromboembolic event in the lower extremity  and a history of atrial tachycardia with thrombus formation after  closure of the patent foramen ovale in June of 2006, and a history of  Coumadin use who presents with mid-abdominal pain.  The patient has had  a long history of colitis and what sounds like irritable bowel disease  and is followed by Dr. Karilyn Cota.  Recent colonoscopy in 2006, which was  overall unremarkable.  She has had a long history of constipation and  intermittent rectal bleeding.  She does report some bright red blood per  rectum today.  However, she has had some constipation with straining.  She came into the emergency department because of the mid epigastric  pain.  White count was found to be elevated at 17,000.  She has had no  recent travel.  No recent fresh water.  No sick contacts.  No other new  foods.  The pain seems to be controlled with IV Dilaudid.   CT of the abdomen was questionable for colitis in the transverse colon  but the vascular supply was normal.  My initial concern was could this  be a thrombus since she has had this prior in the abdomen.  Cardiovascular, respiratory and GU review of systems are otherwise  negative.  She is on her menses now.   PAST MEDICAL HISTORY:  1. IBS.  2. Migraine headaches.  3. CVA.  4. DVT.  5. Patch of the patent foramen ovale.  6. GERD.  7. Left shoulder surgery.  8. Cholecystectomy in 1998.  9.  Tubal ligation many years ago.  10.Hemorrhoidectomy.   CURRENT MEDICATIONS:  1. Lexapro 20 mg daily.  2. Topamax 100 mg daily.  3. Trazodone 200 mg q.h.s.  4. Crestor 10 mg at night, she thinks this might be the dose.  5. Wellbutrin, she is unsure of dose either.   ALLERGIES:  LATEX.   SOCIAL HISTORY:  Smoked up until April of 2006.   FAMILY HISTORY:  No significant family history.   ADMISSION PHYSICAL EXAMINATION:  VITAL SIGNS:  Temp of 98.4, blood  pressure 125/51, pulse 80, respirations 20, O2 sat 96% on room air.  GENERAL:  When I saw her she was pleasant, talkative in no acute  distress.  HEENT:  Nasopharynx clear.  Moist mucous membranes.  NECK:  Supple.  No lymphadenopathy.  CHEST:  Clear to auscultation bilaterally.  CARDIOVASCULAR:  Regular rate and rhythm.  Normal S1 and S2.  No  murmurs.  ABDOMEN:  Mildly tender in the midepigastrium.  No rebound.  No  guarding.  No masses palpated.  No hepatosplenomegaly.   LABORATORY DATA:  Please see flow sheet for details.  White count is  elevated, as stated.  LFTs are pending.  CT showed questionable colitis,  as stated.   ASSESSMENT/PLAN:  A 45 year old with a history of TIAs, status post  surgery of patent foramen ovale, irritable bowel and GERD who presents  with what sounds like transverse colon colitis.   PLAN:  1. Admit to 2A for close monitoring.  2. I got Dr. Lovell Sheehan to see the patient as he has performed surgery on      her in the past and wanted to get his opinion on is this a surgical      history or not.  He has agreed with our assessment and has not      added anything tonight.  3. We will cover with Cipro 400 mg IV q.12 h. and Flagyl 500 mg IV q.8      h.  4. Pain control.  5. Keep her on her current medications and we will continue to follow      closely.      Corrie Mckusick, [name].  Electronically Signed     JCG/MEDQ  D:  10/02/2006  T:  10/03/2006  Job:  [id number]

## 2010-10-31 NOTE — H&P (Signed)
[name], [name]             ACCOUNT NO.:  [id number]   MEDICAL RECORD NO.:  [id number]          PATIENT TYPE:  INP   LOCATION:  3728                         FACILITY:  [facility]   PHYSICIAN:  Jessica Shaw, [name].DATE OF BIRTH:  [birth year]   DATE OF ADMISSION:  11/29/2004  DATE OF DISCHARGE:                                HISTORY & PHYSICAL   CHIEF COMPLAINT:  Increased heart rate.   HISTORY OF PRESENT ILLNESS:  [name] is a 45 year old female, followed  by Dr. Jacinto Shaw, who had a PFO closure, November 18, 2004.  She had early atrial fib  post-op and was cardioverted.  She was discharged in a sinus rhythm.  Last  evening she was noted to have an increased heart rate about 2 a.m.  She  complains of headache and slight chest pressure with this.  In the emergency  room, she is in an SVT.   PAST MEDICAL HISTORY:  1.  Migraines.  2.  She had gallbladder surgery in 2000.  3.  She has had prior tubal ligation.  4.  She has had prior hemorrhoid surgery.  5.  She had a shoulder injury at work, in the Fall 2005, which required      shoulder surgery.  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  6.  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   CURRENT MEDICATIONS:  1.  Topamax 50 mg a day.  2.  Aspirin 81 mg a day.  3.  Plavix 75 mg a day.   She has no known drug allergies.  She does have a LATEX allergy.   SOCIAL HISTORY:  She is married.  She drinks caffeine occasionally.  She is  a half pack a day smoker.  She has two children.   FAMILY HISTORY:  Remarkable in that she has two sisters with breast cancer.  Her mother died of lung cancer.   REVIEW OF SYSTEMS:  Unremarkable for hemoptysis, syncope, or fever.  She has  had recent onset of heavy menses.   PHYSICAL EXAMINATION:  VITAL SIGNS:  Blood pressure 114/78, pulse 142,  respirations 16.  GENERAL:  She is a well-developed, well-nourished female in no acute  distress.  HEENT:  Normocephalic.  Extraocular movements are intact.  Sclerae is  nonicteric.  NECK:  Without bruit and without JVD.  CHEST:  Clear to auscultation percussion.  CARDIAC:  Reveals a regular rate and rhythm without murmur, gallop or rub.  Normal S1 S2.  Rate is increased.  ABDOMEN:  Nontender.  No hepatosplenomegaly.  EXTREMITIES:  Without edema.  Distal pulses are 2+/4.  NEURO:  Grossly intact.   LABORATORY:  Pending.   IMPRESSION:  1.  Paroxysmal supraventricular tachycardia, after PFO closure, November 18, 2004.  2.  Paroxysmal atrial fibrillation after PFO closure, requiring      cardioversion.  3.  History of transient ischemic attack.  4.  Migraines.   PLAN:  1.  The patient will be admitted to telemetry.  2.  Started on heparin without a bolus, and Cardizem, and beta-blocker.       LKK/MEDQ  D:  11/29/2004  T:  11/29/2004  Job:  [id number]

## 2010-10-31 NOTE — H&P (Signed)
[name], Shaw             ACCOUNT NO.:  [id number]   MEDICAL RECORD NO.:  [id number]          PATIENT TYPE:  AMB   LOCATION:  DAY                           FACILITY:  [facility]   PHYSICIAN:  Lionel December, [name].    DATE OF BIRTH:  04/19/1966   DATE OF ADMISSION:  DATE OF DISCHARGE:  LH                                HISTORY & PHYSICAL   CHIEF COMPLAINT:  Colonoscopy for hemoccult-positive stool and chronic  constipation.   HISTORY OF PRESENT ILLNESS:  Jessica Shaw is a 45 year old Caucasian female  who reports a long-standing history of chronic constipation.  She also notes  right upper quadrant pain for the last month.  She describes the pain as  just below her right ribs.  It radiates through to her back.  She rates the  pain a 9/10 on a pain scale.  The pain is intermittent.  It began lasting  several hours.  Movement does occasionally make the pain better.  She  describes the pain as a constant pressure.  There is no associated nausea or  vomiting with the pain.  She does have a history of chronic constipation.  She has been taking mag citrate as well as mineral oil for her symptoms with  very little relief.  She is currently on Lactulose 30 mL daily.  She can go  up to 2 weeks without a bowel movement.  More recently, however, she has  been having a daily bowel movement.  She denies any rectal bleeding or  melena.  She denies any anorexia or early satiety.  She reports a 60-pound  weight gain in the last 6 months.  She has been seen by Dr. Regino Schultze and has  had a normal thyroid workup.  She has had nausea and vomiting on one  occasion.  She states she feels like she could be pregnant even though her  tubes have been tied, given her symptoms.  She also notes breast tenderness  and increased abdominal girth and bloating.  She has had very irregular  menses lately, about 3 months ago she was several days late, where normally  she would have a regular 28-day cycle.  The previous  month she was 2 weeks  late.  This month she only spotted for 2 days, again which is unusual for  her.  She did do a home pregnancy test last night which was negative.   PAST MEDICAL HISTORY:  1.  IBS.  2.  Migraine headaches.  3.  CVA.  4.  DVT.  5.  She has had a cardiac surgery with a patch between left and right atria.  6.  She has intermittent GERD symptoms.  7.  Left shoulder surgery.  8.  Cholecystectomy in 1998.  9.  Tubal ligation 16 years ago.  10. Sinus surgery.  11. Left breast benign lumpectomy.  12. Hemorrhoidectomy.   CURRENT MEDICATIONS:  1.  Zoloft 50 mg daily.  2.  Coumadin 5 mg daily.  3.  Xanax 0.5 mg q.h.s.  4.  Lyrica 50 mg b.i.d.  5.  Amitriptyline 25 mg daily.  6.  Topamax 25 mg p.o. b.i.d.  7.  Relpax 40 mg p.r.n.  8.  Lactulose 30 cc daily.  9.  Diltiazem CD 180 mg daily.  10. Mag citrate p.r.n.   ALLERGIES:  ASPIRIN.   FAMILY HISTORY:  No known family history of colorectal carcinoma or other  chronic GI problems.  Father age 80 has history of diabetes mellitus.  She  has 17 siblings, all of whom are relatively healthy, at least the ones she  has contact with.   SOCIAL HISTORY:  Ms. Myint is in her second marriage for the last 3  years.  She has two sons, one is grown and out of the house.  The other son  is 61.  Both are healthy.  She is disabled.  She has 21 pack-year history of  tobacco use, quitting 8 months ago.  She denies any alcohol or drug use.   REVIEW OF SYSTEMS:  CONSTITUTIONAL:  See HPI.  Denies any fever.  Denies any  chills.  CARDIOVASCULAR:  Denies any palpitations or chest pain.  PULMONARY:  Denies any shortness of breath, dyspnea, cough, hemoptysis.  GI:  See HPI.  She does have very rare heartburn and indigestion.  Denies any dysphagia or  odynophagia.   PHYSICAL EXAMINATION:  VITAL SIGNS:  Weight 160 lbs which is up 36 pounds  from 2 years ago when we last saw her.  Height 61 inches, temperature 98.3,  blood pressure  126/80, pulse 62.  GENERAL:  Jessica Shaw is a 45 year old Caucasian female who is alert,  oriented, pleasant and cooperative in no acute distress.  HEENT:  Sclerae are clear and nonicteric.  Conjunctivae are pink.  Oropharynx is pink and moist without any lesions.  NECK:  Supple without mass or thyromegaly.  CHEST:  Heart is regular rate and rhythm, normal S1 and S2.  She does have  mild tenderness along the right costal margin.  LUNGS:  Clear to auscultation bilaterally.  ABDOMEN:  Protuberant with positive bowel sounds x 4.  No bruits  auscultated.  Soft, nontender and nondistended without palpable mass or  hepatosplenomegaly.  No rebound tenderness or guarding.  No  hepatosplenomegaly or mass.  RECTAL:  There are a few external hemorrhoids protruding.  Internal exam  reveals a 1.5-cm lesion, I suspect an internal hemorrhoid posteriorly.  Anteriorly she does have a bulge which is not fixed.  She does have a small  amount of medium brown stool which came from the vault and is hemoccult-  positive.  EXTREMITIES:  Without clubbing or edema bilaterally.   MEDICAL RECORDS REVIEWED:  Laboratory studies from Dr. Edison Simon office  May 21, 2005, she had a white blood cell count of 12.1, hemoglobin 13.5,  hematocrit 41.9, and platelets 376.  Lymphocytes were 3.8 and monocytes 1.0.  She has normal complete metabolic panel.  She also had a normal T4 and TSH.   IMPRESSION:  Jessica Shaw is a 45 year old Caucasian female with a history  of chronic constipation.  She also has had a 80-month history of right upper  quadrant abdominal pain that is quite severe and constant.  She has noted  significant weight gain.  We have a documented 36 pounds over the last 2  years.  Her thyroid studies have been normal thus far.  On exam today she is  found to hemoccult-positive stool, of course that is worrisome given her symptoms of chronic constipation; however, she does have a history of  hemorrhoids and  this could very well  be the culprit.  I feel further  evaluation is warranted.  As far as her right upper quadrant pain is  concerned, it does not necessarily appear to be associated with her bowel  movements.  Symptoms similar to referred back pain as her pain is worsened  when she sits upright for extended periods of time and changes with  movement.   As far as her irregular menses are concerned and symptoms of pregnancy  including breast tenderness and increased abdominal girth as well as very  irregular cycles in a person who normally has a 28-day cycle, I suspect we  are dealing with hormonal imbalance.  However, she did have a tubal ligation  16 years ago.  We will check serum HCG to verify that she is not pregnant.  I have explained to her that this is very unlikely, although not impossible.   PLAN:  1.  Check serum HCG.  2.  We will scheduled colonoscopy with Dr. Karilyn Cota in the near future.  I      have discussed this procedure including risks and benefits to include      but are not limited to bleeding, infection, perforation, drug reaction.      She agrees with this plan and consent will be obtained.  She is going to      need SBE prophylaxis.  Also we are going to need to hold her Coumadin.      Therefore, we will be talking with Dr. Jacinto Halim at [facility] to determine whether she is going to need Lovenox bridge in the      interim.   We would like to thank Dr. Regino Schultze for allowing [place] to participate in the  care of Ms. Sunderland.      Nicholas Lose, N.P.      Lionel December, [name].  Electronically Signed    KC/MEDQ  D:  06/24/2005  T:  06/24/2005  Job:  [id number]   cc:   Tommye Standard, [name].  [address] - Resident  [place], [place] [address]   Kirk Ruths, [name].  Fax: [phone]   Cristy Hilts. Jacinto Halim, MD  Fax: [phone]

## 2010-10-31 NOTE — Discharge Summary (Signed)
Jessica Shaw, Jessica Shaw             ACCOUNT NO.:  [id number]   MEDICAL RECORD NO.:  [id number]          PATIENT TYPE:  INP   LOCATION:  2908                         FACILITY:  [facility]   PHYSICIAN:  Darlin Priestly, MD  DATE OF BIRTH:  29-Jul-1965   DATE OF ADMISSION:  12/07/2004  DATE OF DISCHARGE:  12/12/2004                                 DISCHARGE SUMMARY   DISCHARGE DIAGNOSIS:  1.  Paroxysmal supraventricular tachycardia after PFO closure.  2.  PFO closure on November 18, 2004.  3.  History of transient ischemic attacks.  4.  History of migraines.   HOSPITAL COURSE:  Patient is a 45 year old female who is noted Dr. Jacinto Halim who  had recently had a PFO closure on November 18, 2004.  During that admission she  did have some atrial fibrillation, but was in sinus rhythm at discharge on  Toprol and diltiazem.  She was admitted December 07, 2004 with rapid heart rate.  She was admitted through the emergency room with a heart rate of 160 and  started on adenosine and IV Cardizem.  She was admitted to telemetry by Dr.  Jenne Campus.  She was started on IV amiodarone, continued on diltiazem.  She was  not put on Coumadin.  She did have a TEE December 09, 2004 that showed two small  clots on the left atrial side of the septum.  She was started on Lovenox and  Coumadin.  Amiodarone was discontinued and she was put on Rythmol.  She was  seen in consult by Dr. Launa Grill during this admission and Dr. Jacinto Halim also  spoke with the Dr. Jenean Lindau at [facility].  We feel she can be discharged December 12, 2004 on Lovenox to Coumadin crossover.  She is in sinus rhythm.   DISCHARGE MEDICATIONS:  1.  Lovenox 60 mg twice a day as directed.  2.  Coumadin 5 mg a day as directed.  3.  Rythmol 225 mg every eight hours.  4.  Toprol XL 50 mg in the morning, 25 mg in the evening.  5.  Topamax 50 mg twice a day.  6.  Tiazac 180 mg a day.  7.  Aspirin 81 mg.  8.  Plavix 75 mg a day.   LABORATORIES:  TSH 1.21.  Liver functions were normal.   Sodium 142,  potassium 3.8, BUN 9, creatinine 0.8.  White count 11.3, hemoglobin 11.3,  hematocrit 32.7.  INR on admission was 1.  EKG at discharge showed sinus  rhythm with a QTC of 434.   DISCHARGE DIAGNOSIS:  1.  Paroxysmal atrial fibrillation status post PFO closure, in sinus rhythm      at discharge on Rythmol.  2.  LA clots noted on TEE this admission.  The patient is discharged on      Lovenox to Coumadin.  3.  Status post recent PFO closure.  4.  History of transient ischemic attacks.  5.  History of breast cancer.  6.  History of migraines.   DISPOSITION:  Patient is discharged in stable condition and will follow up  in the office with Dr. Jacinto Halim  on July 3.  She will have a pro time Monday  after discharge.      Abelino Derrick, P.A.      Darlin Priestly, MD  Electronically Signed    LKK/MEDQ  D:  02/23/2005  T:  02/23/2005  Job:  [id number]   cc:   Cristy Hilts. Jacinto Halim, MD  [address]  [place]  [place] [address]  Fax: [phone]

## 2010-10-31 NOTE — Op Note (Signed)
NAMEJYRAH, Jessica Shaw                       ACCOUNT NO.:  [id number]   MEDICAL RECORD NO.:  [id number]                   PATIENT TYPE:  AMB   LOCATION:  DAY                                  FACILITY:  [facility]   PHYSICIAN:  Lionel December, [name].                 DATE OF BIRTH:  1965-10-03   DATE OF PROCEDURE:  04/20/2003  DATE OF DISCHARGE:                                 OPERATIVE REPORT   PROCEDURE:  Esophagogastroduodenoscopy.   ENDOSCOPIST:  Lionel December, [name].   INDICATIONS FOR PROCEDURE:  Jessica Shaw is a 45 year old Caucasian female with  intermittent epigastric and chest pain felt to be upper GI origin.  She is  status post cholecystectomy years ago.  She is presently on a PPI an doing  better.  Procedure is reviewed with the patient and informed consent was  obtained.   PREOPERATIVE MEDICATIONS:  Cetacaine spray for pharyngeal topical  anesthesia.  Demerol 50 mg IV, Versed 8 mg IV in divided doses.   DESCRIPTION OF PROCEDURE:  The procedure was performed in the endoscopy  suite.  The patient's vital signs and O2 saturation were monitored during  the procedure and remained stable.  The patient was placed in left lateral  position and the Olympus video scope was passed through oral pharynx without  any difficulty into the esophagus.   Esophagus:  Mucosa of the esophagus was normal throughout.  Squamocolumnar  junction revealed erythema at the gastric site.  There was no hernia.   Stomach:  It was empty and distended very well with insufflation.  The folds  of the proximal stomach were normal.  Examination of the mucosa at gastric  body, antrum, pyloric channel as well as angularis, fundus, and cardia was  normal.   Duodenum:  Examination of the bulb reveals normal mucosa.  Similarly, mucosa  and folds to the second part of the duodenum were normal.  Endoscope was  withdrawn.   The patient tolerated the procedure well.   FINAL DIAGNOSES:  1. Mild changes of reflux  esophagitis.  2. Limited gastroesophageal junction, otherwise normal     esophagogastroduodenoscopy.   RECOMMENDATIONS:  Antireflux measures reinforced.  She is to remain on  Protonix 40  mg p.o. q.a.m.  I asked patient to keep a written record of her  episodes of epigastric/chest pain and also determine if there is association  with meals.  If she can usually have these episodes, would consider doubling  her PPI and if she does not respond to therapy will consider esophageal  manometry and a pH study.  She will call office to make an appointment if  symptoms recur.      ___________________________________________  Lionel December, [name].   NR/MEDQ  D:  04/20/2003  T:  04/20/2003  Job:  [id number]   cc:   Corrie Mckusick, [name].  [address] [place]urell Josephs. A  [place]  [place] [address]  Fax: [phone]

## 2010-10-31 NOTE — Discharge Summary (Signed)
Jessica Shaw, Jessica Shaw             ACCOUNT NO.:  [id number]   MEDICAL RECORD NO.:  [id number]          PATIENT TYPE:  INP   LOCATION:  A223                          FACILITY:  [facility]   PHYSICIAN:  Corrie Mckusick, [name].  DATE OF BIRTH:  [birth year]   DATE OF ADMISSION:  10/02/2006  DATE OF DISCHARGE:  LH                               DISCHARGE SUMMARY   DISCHARGE DIAGNOSIS:  Colitis.   HISTORY OF PRESENT ILLNESS:  For past medical history, please see  admission H&P.   HOSPITAL COURSE:  This is a 45 year old female with a history of TIA,  status post surgery for patent foramen ovale, irritable bowel and goiter  who presents with transverse colon colitis.  Dr. Lovell Sheehan was consulted  to ensure that this was not a surgical issue and he agreed with this  diagnosis of colitis.  We did not feel this was ischemic and her  Cardiology group, Dr. Ermalinda Memos, also agreed with this.  She is going to  followup as an outpatient with Dr. Jacinto Halim.  She has done well in the  hospital with white count immediately going down overnight after Flagyl  and Cipro.  Her nausea and vomiting has been well controlled during the  hospital stay.   Again, the day after admission the patient felt quite a bit better.  White count decreased to 13.6.  We continued her fluids, started her on  a clear liquid diet.  The following day on October 04, 2006 we were able  to advance her diet and she tolerated a full diet moderately well.  She  was not able to tolerate a regular diet although she does again tolerate  a full diet.  Please see Cardiology notes for details on October 04, 2006  and I appreciate both their input as well as Dr. Lovell Sheehan.   The patient was ready for discharge on October 05, 2006 and I decided to  go ahead and send her out on 4 more days of Cipro and Flagyl as well as  pain control and Phenergan for nausea.   DISCHARGE PHYSICAL:  VITAL SIGNS:  She is afebrile with a temperature of  98.3.  CHEST:  Clear to  auscultation bilaterally.  CARDIOVASCULAR:  No murmurs.  ABDOMEN:  Bowel sounds positive, soft, nontender, and nondistended.   DISCHARGE MEDICATIONS:  Lexapro, Topamax, trazodone, Crestor, Wellbutrin  are all the same as the admission H&P.  The additional medications are:  1. Cipro 500 mg twice daily for 4 days.  2. Flagyl 500 mg three times daily for 4 days.  3. Phenergan 25 mg every 4 hours as needed.  4. Vicodin 5/500 mg every 6 hours as needed.   FOLLOWUP:  She is to followup with Dr. Sherwood Gambler in 1 week.  She is to  followup with Dr. Jacinto Halim in 2 weeks and Dr. Lovell Sheehan as needed.  She is  also set up for an outpatient colonoscopy with Dr. Lovell Sheehan.      Corrie Mckusick, [name].  Electronically Signed     JCG/MEDQ  D:  10/05/2006  T:  10/05/2006  Job:  [id number]

## 2010-10-31 NOTE — Consult Note (Signed)
Jessica Shaw, Jessica Shaw             ACCOUNT NO.:  [id number]   MEDICAL RECORD NO.:  [id number]          PATIENT TYPE:  INP   LOCATION:  2908                         FACILITY:  [facility]   PHYSICIAN:  Janeece Riggers. Severiano Gilbert, [name].    DATE OF BIRTH:  February 08, 1966   DATE OF CONSULTATION:  12/09/2004  DATE OF DISCHARGE:                                   CONSULTATION   REASON FOR CONSULTATION:  Atrial arrhythmia.   HISTORY OF PRESENT ILLNESS:  A 45 year old white female who I saw  approximately two weeks ago in consultation at [facility] for  complicating atrial dysrhythmias post implantation of a CardioSEAL  interatrial septal defect closure device.  Since the device has been put in,  the patient has had a number of episodes of atrial dysrhythmia.  Atrial  fibrillation initially complicating the first postoperative day which  required cardioversion.  I saw her approximately two weeks ago when she had  sustained atrial tachycardia heart rates varying between approximately 110-  160 beats per minute.  There was no documented atrial flutter or atrial  fibrillation.  She was discharged on a rate control, taking aspirin and  Plavix for prophylaxis against TIAs and strokes secondary to the device  implantation.  Her overall stroke risk for atrial fibrillation given her age  and underlying good LV systolic function is actually relatively modest and  is not really a Coumadin candidate based on best practices guidelines at  current time.  She notes that over the weekend she had several episodes  where her heart rate actually slowed quite dramatically from 130-150 range  down into the 80s, for which she had some lightheadedness.  She was  concerned by this, called to seek medical advice and was told to go to the  hospital.  She was seen in the ED apparently an adenosine was given twice of  low dose 6 mg IV without any termination of arrhythmia.  She was admitted  for telemetry monitoring and was started  on IV amiodarone.  She has done  well in the hospital since her admission without complaints, her rhythm has  been documented by telemetry to be stable, atrial tachycardia varying  between 140-160 beats per minute with atrial rate, ventricular rate is more  variable depending on degree of AV block, although generally it is running  about 130-150.  I was asked to see her for my input versus further treatment  at this time.   PAST MEDICAL HISTORY:  1.  Migraine headache disorder.  2.  PFO.  3.  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  4.  Single episode of atrial fibrillation as discussed in HPI.   SOCIAL HISTORY:  She is married, continues to smoke.   ALLERGIES:  LATEX.   FAMILY HISTORY:  Noncontributory.   REVIEW OF SYSTEMS:  Really negative incomplete except for continued  palpitations and intermittent lightheadedness as discussed in the HPI.   GENERAL:  Well-developed, well-nourished white female in no acute distress.  Blood pressure was 105/65, respirations 12-16, heart  rate 140-150 mostly  regular, she is sating well on room air.  HEENT:  Atraumatic, normocephalic.  NECK:  Supple, 2+ equal carotids, no bruits, no thyromegaly, no adenopathy,  JVP less than 7.  CHEST:  Normal female.  BACK:  Negative CT.  RESPIRATORY:  Clear to auscultation and percussion.  CARDIOVASCULAR:  Tachycardic, regular rate and rhythm, without murmur.  ABDOMEN:  Soft, positive bowel sounds, no organomegaly, nontender.  EXTREMITIES:  No cyanosis, clubbing or edema.  Peripheral pulses 2+ and  equal.  SKIN:  No areas of breakdown or dermatitis.  NEUROLOGIC:  Alert and oriented x4.  Motor sensory nonfocal.  Cranial nerves  II-XII appeared intact.   Electrocardiogram demonstrates a 1/1 atrial tachycardia with atrial  tachycardia vector quite suggestive  of  right upper pulmonary vein or right-  sided interatrial septal focus.  This is  probably related to the implant of  the occlusion device.   LABORATORIES:  Potassium 4, creatinine 0.7, LFT's negative, she is not  anticoagulated, white count 13.6 on admission, hematocrit 43%, TSH is  normal, enzymes were negative.   IMPRESSION:  Atrial tachycardia and certainly atrial tachycardia is probably  a result of either a large  reentrant circuit or an automatic circuit and a  focal tachycardia related to the implantation of the occluder device for  interatrial septal defect.  Historically these rhythms will tend to get  better with time.  It has only been approximately three weeks since  implantation.  She has not had persistent tachycardia and I think we would  all desire to have better rate control.  This has been difficult with just  rate controlling drugs such as Digoxin, beta-blockers and calcium channel  blockers partially because of complicating hypotension.  Because of her  readmission I have discussed with the patient I think it reasonable  therapeutic plan at this time would be cardioversion, not so much to look  for a clot as she is relatively low stroke risk candidate post cardioversion  given her age, underlying left ventricular systolic function, but rather to  carefully evaluate the placement of the occluder device especially evaluate  its relationship to the right upper pulmonary vein.  At that time we could  do a cardioversion, she has already been started on IV amiodarone loaded  intravenously, and short term IV amiodarone in this patient would probably  be a very reasonable therapeutic option.  Given her young age I would not  want to use this drug chronically.  Therefore I think a 3-6 month course of  amiodarone would not be unreasonable.  She desires a relatively expeditious  treatment of underlying problem, has been counseled as to the risks and benefits and desires to proceed with TEE cardioversion and amiodarone  therapy for the next 3-6 months.   Should she sustain normal sinus rhythm on  amiodarone I think the long term plan would be to discontinue the drug  probably after 3-6 months to see if there has been appropriate healing and  an adaptation of her heart to the device.  Of course if she has a more  problematic course, radiofrequency ablation or the tachycardic circuit is  her last option and one that I would entertain with her at that time.       MEP/MEDQ  D:  12/09/2004  T:  12/09/2004  Job:  [id number]

## 2010-10-31 NOTE — Discharge Summary (Signed)
Jessica Shaw, Jessica Shaw             ACCOUNT NO.:  [id number]   MEDICAL RECORD NO.:  [id number]          PATIENT TYPE:  INP   LOCATION:  3728                         FACILITY:  [facility]   PHYSICIAN:  Richard A. Alanda Amass, [name].DATE OF BIRTH:  1966/04/19   DATE OF ADMISSION:  11/29/2004  DATE OF DISCHARGE:  11/30/2004                                 DISCHARGE SUMMARY   DISCHARGE DIAGNOSES:  1.  Atrial tachycardia.  2.  Status post patent foramen ovale closure, November 18, 2004.  3.  History of migraines.  4.  History of transient ischemic attacks.   HOSPITAL COURSE:  The patient is a 45 year old female who had a PFO closure  by Dr. Jacinto Halim, November 18, 2004.  Postprocedure, she had PAF and was  cardioverted.  She was discharged in sinus rhythm.  On the evening prior to  admission, she noted her heart rate was elevated.  She was seen in the  emergency room.  Her heart rate was 140, atrial tachycardia.  She was  admitted to telemetry, started on IV heparin and beta blocker and calcium  blocker IV.  The morning of November 30, 2004, she is improved.  Her heart rate  is down to 100, although she still appears to be in a tachycardia.  Dr.  Severiano Gilbert feels she could be discharged later today on Cardizem and Toprol.  Will continue her aspirin and Plavix.  She has an appointment with Dr. Jacinto Halim  as an outpatient next week.  If she has not converted in a week or two, she  may need to see Dr. Severiano Gilbert again for further evaluation.  Dr. Severiano Gilbert feels  that this arrhythmia is related to mechanical device stimulation.   DISCHARGE MEDICATIONS:  1.  Toprol XL 50 mg in the morning.  2.  Cardizem CD 180 mg in the evening.  3.  Topamax 50 mg daily.  4.  Coated aspirin daily.  5.  Plavix 75 mg daily.  6.  Aspirin as taken at home.   LABORATORIES:  White count 11.3, hemoglobin 13.6, hematocrit 40, platelets  403.  Sodium 140, potassium 3.8, BUN 6, creatinine 0.7.  TSH 0.87.   DISPOSITION:  The patient is discharged in stable  condition, and will keep  her appointment with Dr. Jacinto Halim next week.  She will need to see Dr. Severiano Gilbert if  she does not convert to regular sinus rhythm.       LKK/MEDQ  D:  11/30/2004  T:  12/01/2004  Job:  [id number]   cc:   Cristy Hilts. Jacinto Halim, MD  [address]  [place]  [place] [address]  Fax: [phone]

## 2010-10-31 NOTE — Op Note (Signed)
Jessica Shaw, Jessica Shaw             ACCOUNT NO.:  [id number]   MEDICAL RECORD NO.:  [id number]          PATIENT TYPE:  AMB   LOCATION:  DAY                           FACILITY:  [facility]   PHYSICIAN:  Lionel December, [name].    DATE OF BIRTH:  Sep 14, 1965   DATE OF PROCEDURE:  08/04/2005  DATE OF DISCHARGE:                                 OPERATIVE REPORT   PROCEDURE:  Colonoscopy.   INDICATIONS:  [name] is a 45 year old Caucasian female with multiple medical  problems who has chronic constipation and was noted to have heme-positive  stools, and she has a one-month history of right upper quadrant pain,  usually associated with meals. Her constipation seems to have been well  controlled with diet and lactulose. She is undergoing diagnostic  colonoscopy. Procedure and risks were reviewed with the patient, and  informed consent was obtained.   MEDICINES FOR CONSCIOUS SEDATION:  Demerol 50 mg IV, Versed 10 mg IV.   Please note that she was also given SBE prophylaxis with ampicillin and  gentamicin.   FINDINGS:  Procedure performed in endoscopy suite. The patient's vital signs  and O2 saturation were monitored during the procedure and remained stable.  The patient was placed in left lateral position and rectal examination  performed. No abnormality noted on external or digital exam. Olympus  videoscope was placed in rectum and advanced under vision into sigmoid colon  and beyond. Preparation was excellent. Scope was passed into cecum which was  identified by appendiceal orifice and ileocecal valve. Pictures taken for  the record. Short segment of TI was also examined and was normal. Colonic  mucosa was examined for the second time on the way out and was normal  throughout. Rectal mucosa was normal. Scope was retroflexed to examine  anorectal junction, and she had single anal papilla and hemorrhoids below  the dentate line. Endoscope was straightened and withdrawn. The patient  tolerated the  procedure well.   FINAL DIAGNOSIS:  1.  Small external hemorrhoids and anal papilla, otherwise normal      colonoscopy.  2.  Normal terminal ileoscopy.   Etiology of her abdominal pain remains unclear. This could be IBS. If she  does not respond to antispasmodic therapy, I am afraid she may need to be  deferred to tertiary center for EUS and biliary manometry.   RECOMMENDATIONS:  1.  She will resume her Coumadin at usual dose.  2.  Levsin SL t.i.d. or before each meal; prescription given for 60 with      without refill. She will try this for two weeks. She will call the      office with progress support.      Lionel December, [name].  Electronically Signed     NR/MEDQ  D:  08/04/2005  T:  08/04/2005  Job:  [id number]   cc:   Kirk Ruths, [name].  Fax: [phone]   Cristy Hilts. Jacinto Halim, MD  Fax: [phone]

## 2010-10-31 NOTE — H&P (Signed)
Jessica Shaw, Jessica Shaw             ACCOUNT NO.:  [id number]   MEDICAL RECORD NO.:  [id number]          PATIENT TYPE:  DAY   LOCATION:  A319                          FACILITY:  [facility]   PHYSICIAN:  Kirk Ruths, [name].DATE OF BIRTH:  1966-02-09   DATE OF ADMISSION:  05/20/2004  DATE OF DISCHARGE:  LH                                HISTORY & PHYSICAL   CHIEF COMPLAINT:  Pain in her left arm.   PRESENTING ILLNESS:  This is a 45 year old female who has had multiple  procedures done on her left shoulder from a Workers' Compensation injury.  Originally the patient had an avulsed biceps tendon.  This was repaired by  Dr. Annell Greening on October 17th.  The patient states on November the 29th she  saw Dr. Ophelia Charter for frozen shoulder.  She said he numbed her shoulder and  relieved the frozen shoulder and she said subsequently she had some pain,  swelling and tenderness in the medial aspect of her left upper extremity.  The patient underwent repeat shoulder surgery yesterday, after which time  Dr. Ophelia Charter ordered a vascular study of her left upper extremity.  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  Patient will be admitted for  heparinization and Coumadin therapy.  Dr. Nanetta Batty was consulted for  vascular to see if any procedures were indicated.  He indicated just  heparinization anticoagulation would be in order.   PAST MEDICAL HISTORY:  She is allergic to ASPIRIN, in that it upsets her  stomach.   She takes:  1.  Topamax 50 mg daily for chronic headaches.  2.  Zoloft 50 mg h.s.   In addition to the above mentioned shoulder procedures, she is status post  cholecystectomy, status post lumpectomy of her left breast, hemorrhoidectomy  and sinus surgeries.   REVIEW OF SYSTEMS:  Denies nausea, vomiting, chest pain, shortness of  breath.   PHYSICAL EXAMINATION:  GENERAL:  Middle aged female is in no acute  distress.  VITAL SIGNS:  Temperature is 99; pulse is 82 and regular; blood pressure is  126/76; and her respirations are 18 and nonlabored.  HEENT:  TMs normal.  Pupils equal to accommodation.  Oropharynx is benign.  NECK:  Supple without JVD, bruit or thyromegaly.  LUNGS:  Clear in all areas.  HEART:  Regular sinus rhythm without murmurs, gallops or rubs.  ABDOMEN:  Soft and nontender.  EXTREMITIES:  Significant for tenderness with palpable cord.  There is  distribution on the left basilic vein, some mild tenderness, mild edema of  that extremity and mild erythema.  NEUROLOGIC:  Grossly intact.   ASSESSMENT:  Thrombosis of the left basilic vein.   PLAN:  We will admit, heparinize and Coumadinize.     Will   WMM/MEDQ  D:  05/20/2004  T:  05/20/2004  Job:  [id number]   cc:   Veverly Fells. Ophelia Charter, [name].  [address] [place], [place] [address]  Fax: [phone]

## 2010-10-31 NOTE — Consult Note (Signed)
Jessica Shaw, Jessica Shaw             ACCOUNT NO.:  [id number]   MEDICAL RECORD NO.:  [id number]          PATIENT TYPE:  INP   LOCATION:  3728                         FACILITY:  [facility]   PHYSICIAN:  Mark E. Severiano Gilbert, [name].    DATE OF BIRTH:  Nov 19, 1965   DATE OF CONSULTATION:  11/30/2004  DATE OF DISCHARGE:  11/30/2004                                   CONSULTATION   SOURCE OF CONSULTATION:  Richard A. Alanda Amass, [name].   REASON FOR CONSULTATION:  Atrial arrhythmia.   CHIEF COMPLAINT:  Palpitations.   HISTORY OF PRESENT ILLNESS:  The patient is a 45 year old white female who  had closure of PFO on June 6 with Cardioseal device, I believe.  Her post  implantation was complicated by atrial fibrillation requiring cardioversion  for resumption of sinus rhythm on the first day after the procedure.  She  has remote history of deep venous thrombosis upper extremity which has been  appropriately treated for healing with Coumadin.  She is currently off  anticoagulation.  She was discharged same day or next day after the device  was deployed, taking aspirin and Plavix for stroke prevention.  She had a  transthoracic echo done two or three days post device which demonstrated and  I have reviewed a well-seated occluder device located in the interatrial  septum without any evidence of associated clot or dislodgement.  There is no  associated pericardial effusions to suggest pericardial irritation or  threatened erosion.  She notes that the evening before admission after  staying up relatively late watching a movie.  She had the sudden onset of  tachy palpitations associated with some mild discomfort in the chest  although it was only 1/2.  She had some fullness in the head but no headache  and no syncopal symptoms.  After going to sleep with the tachycardia, she  awoke next morning and noticed her heart rate was in the 150 to 160 range  and sought medical care in the emergency department.   Electrocardiograms  were done which revealed an atrial dysrhythmia not fib or flutter and she  was admitted to the hospital for rate control and heparinization as there  was a clinical concern that she might have an atrial flutter.   PAST MEDICAL HISTORY:  1.  Migraine syndrome.  2.  PFO status post closure.  3.  History of upper extremity deep venous thrombosis, now resolved.  4.  Paroxysmal atrial fibrillation as discussed in the HPI.   OUTPATIENT MEDICATIONS:  1.  Aspirin 81 mg.  2.  Plavix 75 mg.  3.  Toprol XL 50 mg by mouth daily.   ALLERGIES:  She has latex allergy by history.  Otherwise no medication  allergy.   SOCIAL HISTORY:  She is married, mother of two, continues to smoke.   FAMILY HISTORY:  Not really contributory at this presentation.   REVIEW OF SYSTEMS:  No change in weight, no fevers, sweats or chills.  Neurologic:  She has not had headache, she has had no signs or symptoms to  suggest TIA or stroke.  Cardiovascular:  She has  a very atypical mild  anterior chest pain, fleeting, lasts for a few seconds.  Pulmonary:  There  is no shortness of breath, there is no orthopnea.  GI:  Negative.  GU:  Negative.  HEENT:  Negative.  Skin:  There is no ecchymosis or easy  bruisability.   PHYSICAL EXAMINATION:  GENERAL:  Well-developed, well-nourished white female  in no acute distress, examined in hospital bed.  VITAL SIGNS:  Blood pressure 100/62.  This is baseline.  Heart rate 100 and  regular.  Respiratory rate 16 to 20.  Afebrile.  Satting well on room air.  HEENT:  Normocephalic and atraumatic.  NECK:  Supple, 2+ carotids, no jugular venous distention, no neck vein  distention, no adenopathy.  She does have a 30 g easily palpable thyroid  without nodularity.  CHEST:  Normal female.  BACK:  Negative CAT.  RESPIRATORY:  Clear to auscultation and percussion.  CARDIOVASCULAR:  Regular rate and rhythm, approximately 100.  Normal S1,  physiologically split S2, no  S3-S4.  No murmurs.  I listened carefully for a  rub both supine and upright as well as leaning forward, no evidence of a  rub.  ABDOMEN:  Soft and nontender, positive bowel sounds.  No organomegaly.  EXTREMITIES:  No cyanosis, clubbing or edema.  Peripheral pulses 2+ and  equal.  NEUROLOGIC:  Alert and oriented times 4.  Motor and sensory were nonfocal.  Cranial nerves 2 through 12 grossly intact.   12-lead electrocardiograms were reviewed as well as telemetry as well as  transient carotid sinus massage.  The patient quite clearly has an atrial  tachycardia with 1 to 1 conduction ranging in rates between 140 and 100  beats per minute.  Given the heart rate variability and effective carotid  sinus massage, this appears to be an automatic atrial tachycardia rather  than re-entrant although that is best clinical opinion.  She has responded  to beta blockers and calcium channel blockers with a slowing in her atrial  tachycardia rate.  Based on 12-lead vector analysis of her P-wave, I suspect  that this most likely a right upper atrial tachycardia origin.   IMPRESSION:  Atrial tachycardia.  The patient had implantation of a  CardioSeal device complicated initially by atrial fibrillation which after  cardioversion stabilized and now presents with atrial tachycardia.  I  suspect these are very likely related to device irritation which occurs in  approximately 2 to 3% of implants.  These typically are self limited  although rarely, patients will have long term incessant rhythms that require  primary intervention.  Given the relatively close temporal relation to the  device implantation, I would advocate rate control with re-evaluation in two  to four weeks to consider radio frequency ablation of either a macro re-  entrant tachycardia around the device which is most unlikely or focal  automatic atrial tachycardia.  Atria tachycardia rate is not particularly fast.  This is not atrial  fibrillation or flutter.  As such, I would think  that full dose anticoagulation is not necessary.  Her thromboembolic risk is  really related to the device itself which standard treatment at this point  would be aspirin and Plavix which she is on.   PLAN:  Lopressor 50 q.d. XL form.  Addition of Cardiazem 180 mg daily --  split these doses apart as she does not have a very high blood pressure and  no history of hypertension.  I think she should see Dr. Jacinto Halim probably  in a  couple of weeks for evaluation.  I suspect this is probably already  arranged.  If her tachycardia is persisting, I would be happy to see her  after that.  She is to return if she has syncope, severe chest pain or  severe shortness of breath.       MEP/MEDQ  D:  11/30/2004  T:  12/01/2004  Job:  [id number]

## 2010-10-31 NOTE — H&P (Signed)
   NAME:  Jessica Shaw, Jessica Shaw                        ACCOUNT NO.:  [id number]   MEDICAL RECORD NO.:  [id number]                   PATIENT TYPE:  AMB   LOCATION:  DAY                                  FACILITY:  [facility]   PHYSICIAN:  Dalia Heading, [name].               DATE OF BIRTH:  1965-06-28   DATE OF ADMISSION:  DATE OF DISCHARGE:                                HISTORY & PHYSICAL   CHIEF COMPLAINT:  Thrombosed hemorrhoids.   HISTORY OF PRESENT ILLNESS:  The patient is a 45 year old white female who  is referred for evaluation and treatment with thrombosed hemorrhoid.  It has  been present for four days.  She was seen in the emergency room several days  ago.  She has had pain since that time.  She has had hemorrhoidal problems  in the past after a pregnancy.  She denies any constipation.  She states she  has a dumping syndrome which causes loose stools.   PAST MEDICAL HISTORY:  Unremarkable.   PAST SURGICAL HISTORY:  1. Lumpectomy.  2. Tubal ligation.  3. Cholecystectomy.   CURRENT MEDICATIONS:  Oxycodone.   ALLERGIES:  No known drug allergies.   REVIEW OF SYSTEMS:  The patient does smoke a pack of cigarettes a day.  She  denies any significant alcohol use.  She denies any bleeding disorders.   PHYSICAL EXAMINATION:  GENERAL:  The patient is a well-developed, well-  nourished, white female in no acute distress.  VITAL SIGNS:  She is afebrile.  Vital signs are stable.  LUNGS:  Clear to auscultation with equal breath sounds bilaterally.  HEART:  Reveals regular rate and rhythm without S3, S4, or murmurs.  RECTAL:  Examination revealed a thrombosed internal hemorrhoid noted at the  4 o'clock position.  An external hemorrhoid is noted at the 12 o'clock  position.   IMPRESSION:  Thrombosed hemorrhoid.    PLAN:  The patient is scheduled for a hemorrhoidectomy on April 12, 2002.  The risks and benefits of the procedure including bleeding, infection, and  the possible recurrence  of the hemorrhoid were fully explained to the  patient, who gave informed consent.                                                  Dalia Heading, [name].    MAJ/MEDQ  D:  04/11/2002  T:  04/11/2002  Job:  [id number]   cc:   Madelin Rear. Sherwood Gambler, [name].  [address]  [place]  [place] [address]  Fax: [phone]

## 2010-10-31 NOTE — Consult Note (Signed)
Jessica Shaw                    ACCOUNT NO.:  [id number]   MEDICAL RECORD NO.:  [id number]                   PATIENT TYPE:   LOCATION:                                       FACILITY:  [facility]   PHYSICIAN:  Lionel December, [name].                 DATE OF BIRTH:  05-26-1966   DATE OF CONSULTATION:  04/18/2003  DATE OF DISCHARGE:                                   CONSULTATION   GASTROENTEROLOGY CONSULTATION:   PHYSICIAN REQUESTING CONSULTATION:  Dr. Assunta Found.   REASON FOR CONSULTATION:  Upper endoscopy.   HISTORY OF PRESENT ILLNESS:  Jessica Shaw is a 45 year old Caucasian female patient  of Dr. Assunta Found who recently was seen by Carilyn Goodpasture, P.A.-C. and  was recommended to follow up with [place] for upper endoscopy.  Earlier in  October she had an episode of severe substernal chest pain/pressure  associated with epigastric pain which prompted an ED visit via EMS.  She was  checked for cardiac causes and was told that she had severe acid reflux and  may have aspirated as well.  Chest x-ray was unremarkable.  Blood work  revealed negative cardiac enzyme x1, unremarkable CBC, D-dimer.  She was  started on Protonix.  She states that her symptoms did not respond to  medication.  She has to be extremely careful with what she eats.  She can  only drink water.  Carbonated beverages really initiate her symptoms.  She  feels like she has a lump in her throat at all times.  Really denies any  dysphagia.  Denies any nausea or vomiting.  She has had episodes of  shortness of breath on two occasions with this.  Denies any melena or rectal  bleeding.  She lost 12 pounds in the last month because she is scared to  eat.  She also has a long history of postprandial loose stools with severe  abdominal cramps.  On occasion she will have severe pain and pass out.  Tends to be worse with greasy and fat foods.  She tells me when she goes out  she does not eat because of this.  She takes a  significant amount of NSAIDs.  She takes at least eight doses of Advil and/or Excedrin daily.  This is for  migraine headache.  Denies any prior history of peptic ulcer disease.  She  was given samples of Aciphex which seemed to help more.  She has also taken  Prevacid which seems to help the most.   CURRENT MEDICATIONS:  1. Protonix 40 mg daily.  2. Tylenol p.r.n.  3. Advil p.r.n.  4. Excedrin p.r.n.   ALLERGIES:  ASPIRIN.   PAST MEDICAL HISTORY:  Frequent/daily headaches possibly migraine, has not  had a complete workup.   PAST SURGICAL HISTORY:  Cholecystectomy in 2000, lumpectomy presumably  breast in 1998, tubal ligation 14 years ago, hemorrhoidectomy earlier this  year.   FAMILY HISTORY:  Mother died of small cell lung cancer.  She has two sisters  with a history of breast cancer.  No family history of colorectal cancer.   SOCIAL HISTORY:  She has been married for 7 months, has two sons.  She is  employed with [employer].  She smokes one pack of cigarettes every  other day.  She has been smoking for almost 20 years.  Denies any alcohol  use.   REVIEW OF SYSTEMS:  Please see HPI for GI, for GENERAL, for CARDIOPULMONARY.   PHYSICAL EXAMINATION:  VITAL SIGNS:  Weight 122, height 5 feet 1 inch,  temperature 97.6, blood pressure 110/80, pulse 78.  GENERAL:  Pleasant young well-nourished, well-developed Caucasian female in  no acute distress.  SKIN:  Warm and dry; no jaundice.  HEENT:  Pupils equal, round and reactive to light; conjunctivae are pink;  sclerae nonicteric.  Oropharyngeal mucosa moist and pink; no lesions,  erythema, or exudate.  No lymphadenopathy or thyromegaly.  CHEST:  Lungs are clear to auscultation.  CARDIAC:  Regular rate and rhythm, normal S1 and S2; no murmurs, rubs, or  gallops.  ABDOMEN:  Positive bowel sounds, soft, nondistended.  She has moderate  epigastric tenderness to deep palpation; no organomegaly or masses.  EXTREMITIES:  No  edema.   IMPRESSION:  Jessica Shaw is a 45 year old lady with substernal chest  pain/epigastric pain in the setting of significant nonsteroidal anti-  inflammatory drug use.  Would be concerned about erosive/ulcerative reflux  esophagitis, peptic ulcer disease.  In addition she has a chronic daily  headache which needs to be further evaluated as I feel that she cannot  continue to take chronic nonsteroidal anti-inflammatory drugs as she is  doing.  In addition she describes postprandial loose stools which was  associated with abdominal cramps which most likely is due to irritable bowel  syndrome.  She does not want to pursue any further workup of this at this  time.   PLAN:  1. EGD in the near future.  2. Limit NSAIDs for now, would like her to discontinue, however this does     not appear feasible with the degree of her headaches.  3. Discontinue Protonix, begin Prevacid 30 mg p.o. once daily (#30 samples     given).  4. Trial of NuLev (#6 samples) take one sublingual t.i.d. p.r.n.  5. Encourage smoking cessation.  6. Further recommendations to follow.   I would like to thank Dr. Assunta Found for allowing [place] to take part in the  care of this patient.     ________________________________________  ___________________________________________  Tana Coast, P.A.                         Lionel December, [name].   LL/MEDQ  D:  04/18/2003  T:  04/18/2003  Job:  [id number]   cc:   Corrie Mckusick, [name].  [address] [place] [place]  [place]  [place] [address]  Fax: [phone]

## 2011-03-05 LAB — COMPREHENSIVE METABOLIC PANEL
CO2: 25
Calcium: 8.4
Creatinine, Ser: 0.68
GFR calc non Af Amer: >60
Glucose, Bld: 99

## 2011-03-05 LAB — CBC
Hemoglobin: 14.7
MCHC: 34.2
MCV: 87.4
RBC: 4.9

## 2011-03-05 LAB — URINALYSIS, ROUTINE W REFLEX MICROSCOPIC
Bilirubin Urine: NEGATIVE
Ketones, ur: 15 — AB
Nitrite: NEGATIVE
Specific Gravity, Urine: 1.02
Urobilinogen, UA: 0.2

## 2011-03-05 LAB — DIFFERENTIAL
Eosinophils Absolute: 0.2
Lymphocytes Relative: 32
Lymphs Abs: 2.5
Neutrophils Relative %: 55

## 2011-03-05 LAB — PREGNANCY, URINE: Preg Test, Ur: NEGATIVE

## 2011-03-05 LAB — LIPASE, BLOOD: Lipase: 24

## 2011-03-12 LAB — D-DIMER, QUANTITATIVE: D-Dimer, Quant: 0.23

## 2011-03-17 LAB — CBC
HCT: 40.8
Hemoglobin: 13.8
Hemoglobin: 13.9
MCV: 91
MCV: 91
Platelets: 298
Platelets: 306
Platelets: 308
RBC: 4.47
RDW: 13.4
WBC: 15 — ABNORMAL HIGH
WBC: 22.8 — ABNORMAL HIGH

## 2011-03-17 LAB — LIPID PANEL
HDL: 31 — ABNORMAL LOW
LDL Cholesterol: 180 — ABNORMAL HIGH
Total CHOL/HDL Ratio: 8.1
Triglycerides: 202 — ABNORMAL HIGH
VLDL: 40

## 2011-03-17 LAB — POCT I-STAT, CHEM 8
Calcium, Ion: 1.14
Creatinine, Ser: 0.7
Glucose, Bld: 138 — ABNORMAL HIGH
HCT: 44
Hemoglobin: 15
Potassium: 3.7

## 2011-03-17 LAB — URINE CULTURE
Colony Count: NO GROWTH
Culture: NO GROWTH

## 2011-03-17 LAB — POCT CARDIAC MARKERS: Troponin i, poc: <0.05

## 2011-03-17 LAB — DIFFERENTIAL
Basophils Relative: 0
Eosinophils Absolute: 0.4
Eosinophils Absolute: 0.5
Eosinophils Relative: 2
Lymphocytes Relative: 21
Lymphs Abs: 2.9
Lymphs Abs: 4.9 — ABNORMAL HIGH
Lymphs Abs: 5.4 — ABNORMAL HIGH
Monocytes Absolute: 0.6
Monocytes Absolute: 0.9
Monocytes Relative: 4
Neutro Abs: 12.6 — ABNORMAL HIGH
Neutrophils Relative %: 52

## 2011-03-17 LAB — BASIC METABOLIC PANEL
BUN: 11
BUN: 7
CO2: 23
Calcium: 9
Calcium: 9.1
Chloride: 105
Creatinine, Ser: 0.58
Creatinine, Ser: 0.68
GFR calc non Af Amer: >60
Glucose, Bld: 119 — ABNORMAL HIGH

## 2011-03-17 LAB — CK TOTAL AND CKMB (NOT AT ARMC)
CK, MB: 1
Total CK: 65

## 2011-03-17 LAB — COMPREHENSIVE METABOLIC PANEL
ALT: 33
ALT: 39 — ABNORMAL HIGH
Albumin: 4.1
Alkaline Phosphatase: 61
BUN: 12
BUN: 13
CO2: 23
Calcium: 9.2
Calcium: 9.2
Creatinine, Ser: 0.63
GFR calc non Af Amer: >60
Glucose, Bld: 136 — ABNORMAL HIGH
Potassium: 3.6
Sodium: 131 — ABNORMAL LOW
Total Protein: 7
Total Protein: 7.3

## 2011-03-17 LAB — TROPONIN I: Troponin I: 0.01

## 2011-03-17 LAB — URINALYSIS, ROUTINE W REFLEX MICROSCOPIC
Glucose, UA: 500 — AB
Ketones, ur: 15 — AB
Protein, ur: 30 — AB
pH: 5.5

## 2011-03-17 LAB — URINE MICROSCOPIC-ADD ON

## 2011-03-17 LAB — PROTIME-INR: INR: 0.9

## 2011-03-17 LAB — MAGNESIUM: Magnesium: 2.5

## 2011-03-17 LAB — T4, FREE: Free T4: 0.96

## 2011-03-17 LAB — APTT: aPTT: 26

## 2011-03-17 LAB — PREGNANCY, URINE: Preg Test, Ur: NEGATIVE

## 2011-03-26 LAB — DIFFERENTIAL
Basophils Absolute: 0.1
Basophils Relative: 1
Eosinophils Absolute: 0.1
Eosinophils Relative: 1
Lymphocytes Relative: 14

## 2011-03-26 LAB — CBC
HCT: 37.9
MCV: 86.9
Platelets: 278
RDW: 13.4

## 2011-03-26 LAB — STREP A DNA PROBE

## 2011-03-26 LAB — RAPID STREP SCREEN (MED CTR MEBANE ONLY): Streptococcus, Group A Screen (Direct): NEGATIVE

## 2011-08-07 ENCOUNTER — Other Ambulatory Visit: Payer: Self-pay | Admitting: Physician Assistant

## 2011-08-07 DIAGNOSIS — Z139 Encounter for screening, unspecified: Secondary | ICD-10-CM

## 2011-08-11 ENCOUNTER — Ambulatory Visit (HOSPITAL_COMMUNITY): Payer: BC Managed Care – PPO

## 2011-08-13 ENCOUNTER — Ambulatory Visit (HOSPITAL_COMMUNITY)
Admission: RE | Admit: 2011-08-13 | Discharge: 2011-08-13 | Disposition: A | Discharge status: Home / Self Care | Payer: BC Managed Care – PPO | Source: Ambulatory Visit | Attending: Physician Assistant | Admitting: Physician Assistant

## 2011-08-13 DIAGNOSIS — Z139 Encounter for screening, unspecified: Secondary | ICD-10-CM

## 2011-08-13 DIAGNOSIS — Z1231 Encounter for screening mammogram for malignant neoplasm of breast: Secondary | ICD-10-CM | POA: Insufficient documentation

## 2011-08-14 ENCOUNTER — Other Ambulatory Visit: Payer: Self-pay | Admitting: Physician Assistant

## 2011-08-14 DIAGNOSIS — R928 Other abnormal and inconclusive findings on diagnostic imaging of breast: Secondary | ICD-10-CM

## 2011-09-02 ENCOUNTER — Ambulatory Visit (HOSPITAL_COMMUNITY)
Admission: RE | Admit: 2011-09-02 | Discharge: 2011-09-02 | Disposition: A | Discharge status: Home / Self Care | Payer: BC Managed Care – PPO | Source: Ambulatory Visit | Attending: Physician Assistant | Admitting: Physician Assistant

## 2011-09-02 ENCOUNTER — Other Ambulatory Visit: Payer: Self-pay | Admitting: Physician Assistant

## 2011-09-02 DIAGNOSIS — R928 Other abnormal and inconclusive findings on diagnostic imaging of breast: Secondary | ICD-10-CM

## 2011-09-02 DIAGNOSIS — N63 Unspecified lump in unspecified breast: Secondary | ICD-10-CM | POA: Insufficient documentation

## 2011-09-20 ENCOUNTER — Encounter (HOSPITAL_COMMUNITY): Payer: Self-pay

## 2011-09-20 ENCOUNTER — Emergency Department (HOSPITAL_COMMUNITY): Payer: BC Managed Care – PPO

## 2011-09-20 ENCOUNTER — Emergency Department (HOSPITAL_COMMUNITY)
Admission: EM | Admit: 2011-09-20 | Discharge: 2011-09-20 | Disposition: A | Discharge status: Home / Self Care | Payer: BC Managed Care – PPO | Attending: Emergency Medicine | Admitting: Emergency Medicine

## 2011-09-20 ENCOUNTER — Other Ambulatory Visit: Payer: Self-pay

## 2011-09-20 DIAGNOSIS — H5710 Ocular pain, unspecified eye: Secondary | ICD-10-CM | POA: Insufficient documentation

## 2011-09-20 DIAGNOSIS — Z86718 Personal history of other venous thrombosis and embolism: Secondary | ICD-10-CM | POA: Insufficient documentation

## 2011-09-20 DIAGNOSIS — R51 Headache: Secondary | ICD-10-CM | POA: Insufficient documentation

## 2011-09-20 DIAGNOSIS — Z8673 Personal history of transient ischemic attack (TIA), and cerebral infarction without residual deficits: Secondary | ICD-10-CM | POA: Insufficient documentation

## 2011-09-20 DIAGNOSIS — H9209 Otalgia, unspecified ear: Secondary | ICD-10-CM | POA: Insufficient documentation

## 2011-09-20 DIAGNOSIS — Z79899 Other long term (current) drug therapy: Secondary | ICD-10-CM | POA: Insufficient documentation

## 2011-09-20 DIAGNOSIS — R0602 Shortness of breath: Secondary | ICD-10-CM | POA: Insufficient documentation

## 2011-09-20 DIAGNOSIS — M542 Cervicalgia: Secondary | ICD-10-CM | POA: Insufficient documentation

## 2011-09-20 DIAGNOSIS — R079 Chest pain, unspecified: Secondary | ICD-10-CM | POA: Insufficient documentation

## 2011-09-20 HISTORY — DX: Embolism and thrombosis of unspecified artery: I74.9

## 2011-09-20 HISTORY — DX: Cerebral infarction, unspecified: I63.9

## 2011-09-20 LAB — COMPREHENSIVE METABOLIC PANEL
ALT: 10 U/L (ref 0–35)
AST: 12 U/L (ref 0–37)
Albumin: 3.8 g/dL (ref 3.5–5.2)
CO2: 23 mEq/L (ref 19–32)
Chloride: 104 mEq/L (ref 96–112)
GFR calc non Af Amer: >90 mL/min (ref >90)
Sodium: 137 mEq/L (ref 135–145)
Total Bilirubin: 0.3 mg/dL (ref 0.3–1.2)

## 2011-09-20 LAB — CBC
Platelets: 275 10*3/uL (ref 150–400)
RDW: 12.9 % (ref 11.5–15.5)
WBC: 12.8 10*3/uL — ABNORMAL HIGH (ref 4.0–10.5)

## 2011-09-20 LAB — DIFFERENTIAL
Basophils Absolute: 0.1 10*3/uL (ref 0.0–0.1)
Lymphocytes Relative: 26 % (ref 12–46)
Neutro Abs: 8.2 10*3/uL — ABNORMAL HIGH (ref 1.7–7.7)
Neutrophils Relative %: 64 % (ref 43–77)

## 2011-09-20 MED ORDER — HYDROMORPHONE HCL PF 1 MG/ML IJ SOLN
1.0000 mg | Freq: Once | INTRAMUSCULAR | Status: AC
  Administered 2011-09-20: 1 mg via INTRAVENOUS
  Filled 2011-09-20: qty 1

## 2011-09-20 MED ORDER — ONDANSETRON HCL 4 MG/2ML IJ SOLN
4.0000 mg | Freq: Once | INTRAMUSCULAR | Status: AC
  Administered 2011-09-20: 4 mg via INTRAVENOUS
  Filled 2011-09-20: qty 2

## 2011-09-20 MED ORDER — CELECOXIB 200 MG PO CAPS: 200.0000 mg | ORAL_CAPSULE | Freq: Two times a day (BID) | ORAL | Status: AC

## 2011-09-20 MED ORDER — HYDROCODONE-ACETAMINOPHEN 5-325 MG PO TABS: 1.0000 | ORAL_TABLET | Freq: Four times a day (QID) | ORAL | Status: AC | PRN

## 2011-09-20 MED ORDER — IOHEXOL 350 MG/ML SOLN
80.0000 mL | Freq: Once | INTRAVENOUS | Status: AC | PRN
  Administered 2011-09-20: 80 mL via INTRAVENOUS

## 2011-09-20 NOTE — ED Provider Notes (Signed)
History  Scribed for Jessica Lennert, MD, the patient was seen in room APA14/APA14. This chart was scribed by Candelaria Stagers. The patient's care started at 12:12 PM    CSN: [id number]  Arrival date & time 09/20/11  1048   First MD Initiated Contact with Patient 09/20/11 1207      Chief Complaint  Patient presents with  . Neck Pain  . Chest Pain     Patient is a 46 y.o. female presenting with neck injury.  Neck Injury This is a new problem. The current episode started more than 2 days ago. Episode frequency: intermittent pain. The problem has not changed since onset.Associated symptoms include chest pain (mild ), headaches and shortness of breath. The symptoms are aggravated by bending and standing. The symptoms are relieved by nothing. She has tried nothing for the symptoms. The treatment provided no relief.    Jessica Shaw is a 46 y.o. female who presents to the Emergency Department complaining of intermittent neck pain on the right side of her neck that started about three days ago.  Pt states that she has also experienced right ear and right eye pain intermittently, headache, SOB, and mild chest pain.  She states that the pain in her neck becomes worse when standing or bending over.  She describes the pain as a pressure.  She has a h/o blood clot in her left arm after a surgical repair of bicep tendon.  She has an occluder in place, has been hospitalized for rapid heart rate in the past, and has a spinal cord stimulator in place.    Her PCP is Dr. Tanya Nones   Past Medical History  Diagnosis Date  . Embolism - blood clot     arm  . Stroke     TIA    Past Surgical History  Procedure Date  . Shoulder arthroscopy     left  . Tubal ligation   . Cholecystectomy   . Spinal cord stimulator implant   . Patent foramen ovale closure     Family History  Problem Relation Age of Onset  . Cancer    . Diabetes      History  Substance Use Topics  . Smoking status: Current  Everyday Smoker  . Smokeless tobacco: Not on file  . Alcohol Use: No    OB History    Grav Para Term Preterm Abortions TAB SAB Ect Mult Living                  Review of Systems  HENT: Positive for neck pain.   Respiratory: Positive for chest tightness (mild) and shortness of breath.   Cardiovascular: Positive for chest pain (mild ).  Neurological: Positive for headaches.  All other systems reviewed and are negative.    Allergies  Aspirin; Morphine and related; and Latex  Home Medications   Current Outpatient Rx  Name Route Sig Dispense Refill  . BUPROPION HCL ER (SR) 150 MG PO TB12 Oral Take 150 mg by mouth 2 (two) times daily.    . IBUPROFEN 200 MG PO TABS Oral Take 800 mg by mouth every 6 (six) hours as needed. Pain      BP 126/79  Pulse 93  Temp(Src) 98.5 F (36.9 C) (Oral)  Resp 18  Ht 5\' 1"  (1.549 m)  Wt 137 lb (62.143 kg)  BMI 25.89 kg/m2  SpO2 96%  Physical Exam  Nursing note and vitals reviewed. Constitutional: She is oriented to person, place, and time.  She appears well-developed and well-nourished. No distress.  HENT:  Head: Normocephalic and atraumatic.  Eyes: EOM are normal. Right eye exhibits no discharge. Left eye exhibits no discharge.  Neck:       Tenderness over the right carotid  Pulmonary/Chest: Effort normal.  Abdominal: There is no tenderness.  Musculoskeletal: Normal range of motion. She exhibits no tenderness.  Neurological: She is alert and oriented to person, place, and time.  Skin: Skin is warm and dry. She is not diaphoretic.  Psychiatric: She has a normal mood and affect. Her behavior is normal.    ED Course  Procedures  DIAGNOSTIC STUDIES: Oxygen Saturation is 96% on room air, normal by my interpretation.    COORDINATION OF CARE:  12:28 Ordered: COMPREHENSIVE METABOLIC PANEL, DIFFERENTIAL, CBC   Labs Reviewed  CBC - Abnormal; Notable for the following:    WBC 12.8 (*)    All other components within normal limits    DIFFERENTIAL - Abnormal; Notable for the following:    Neutro Abs 8.2 (*)    All other components within normal limits  COMPREHENSIVE METABOLIC PANEL   Ct Angio Neck W/cm &/or Wo/cm  09/20/2011  *RADIOLOGY REPORT*  Clinical Data:  Severe neck pain on the right.  History of spinal cord stimulator in neck.  CT ANGIOGRAPHY NECK  Technique:  Multidetector CT imaging of the neck was performed using the standard protocol during bolus administration of intravenous contrast.  Multiplanar CT image reconstructions including MIPs were obtained to evaluate the vascular anatomy. Carotid stenosis measurements (when applicable) are obtained utilizing NASCET criteria, using the distal internal carotid diameter as the denominator.  Contrast: 80mL OMNIPAQUE IOHEXOL 350 MG/ML SOLN  Comparison:  CT neck of 03/13/2007  Findings:  Spinal cord stimulator is present extending from C3-C5 in good position and unchanged from the  prior CT.  No acute cervical spine abnormality.  Negative for mass in the neck.  No pathologic adenopathy.  Right level II node measures 7 x 15 mm.  Left level II lymph node measures 7 x 8 mm.  Thyroid is normal.  Larynx is normal.  Both vertebral arteries are equal in size and widely patent.  No evidence of vertebral artery stenosis or dissection.  Both carotid arteries are widely patent.  No evidence of atherosclerotic disease.  Negative for dissection or stenosis.   Review of the MIP images confirms the above findings.  IMPRESSION: No significant abnormality of the carotid or vertebral artery bilaterally.  No acute abnormality in the neck.  Original Report Authenticated By: Camelia Phenes, [name].     No diagnosis found.    Date: 09/20/2011  Rate:93  Rhythm: normal sinus rhythm  QRS Axis: normal  Intervals: normal  ST/T Wave abnormalities: normal  Conduction Disutrbances:none  Narrative Interpretation:   Old EKG Reviewed: none available   MDM  Muscle inflamation                 The chart was  scribed for me under my direct supervision.  I personally performed the history, physical, and medical decision making and all procedures in the evaluation of this patient.Jessica Lennert, MD 09/20/11 [phone]

## 2011-09-20 NOTE — Discharge Instructions (Signed)
Follow up with your md this week for recheck  °

## 2011-09-20 NOTE — ED Notes (Signed)
Pt reports was at work Friday and had sudden onset of sharp r sided neck pain and felt fatigued.  Reports  That night pain went away on its own  but had a headache.    Reports yesterday pain started back and has not gone away.  Reports had chest pain last night and this morning but denies any now.  Reports history of blood clot in left arm.  Pt reports went to the doctor a couple of weeks ago for heart palpitations.  Pt was sent to her cardiologist and they checked her occluder and was told was fine.

## 2011-11-16 ENCOUNTER — Other Ambulatory Visit: Payer: Self-pay | Admitting: Physician Assistant

## 2011-11-16 ENCOUNTER — Ambulatory Visit
Admission: RE | Admit: 2011-11-16 | Discharge: 2011-11-16 | Disposition: A | Discharge status: Home / Self Care | Payer: BC Managed Care – PPO | Source: Ambulatory Visit | Attending: Physician Assistant | Admitting: Physician Assistant

## 2011-11-16 DIAGNOSIS — M545 Low back pain, unspecified: Secondary | ICD-10-CM

## 2011-11-30 ENCOUNTER — Other Ambulatory Visit: Payer: Self-pay | Admitting: Physician Assistant

## 2011-11-30 DIAGNOSIS — M545 Low back pain, unspecified: Secondary | ICD-10-CM

## 2011-12-01 ENCOUNTER — Ambulatory Visit
Admission: RE | Admit: 2011-12-01 | Discharge: 2011-12-01 | Disposition: A | Discharge status: Home / Self Care | Payer: BC Managed Care – PPO | Source: Ambulatory Visit | Attending: Physician Assistant | Admitting: Physician Assistant

## 2011-12-01 DIAGNOSIS — M545 Low back pain, unspecified: Secondary | ICD-10-CM

## 2012-04-15 ENCOUNTER — Ambulatory Visit (HOSPITAL_COMMUNITY)
Admission: RE | Admit: 2012-04-15 | Discharge: 2012-04-15 | Disposition: A | Discharge status: Home / Self Care | Payer: BC Managed Care – PPO | Source: Ambulatory Visit | Attending: Physician Assistant | Admitting: Physician Assistant

## 2012-04-15 ENCOUNTER — Other Ambulatory Visit: Payer: Self-pay | Admitting: Physician Assistant

## 2012-04-15 DIAGNOSIS — R42 Dizziness and giddiness: Secondary | ICD-10-CM | POA: Insufficient documentation

## 2012-04-15 DIAGNOSIS — R519 Headache, unspecified: Secondary | ICD-10-CM

## 2012-04-15 DIAGNOSIS — R51 Headache: Secondary | ICD-10-CM | POA: Insufficient documentation

## 2012-08-29 ENCOUNTER — Other Ambulatory Visit: Payer: Self-pay | Admitting: Physician Assistant

## 2012-08-29 DIAGNOSIS — Z139 Encounter for screening, unspecified: Secondary | ICD-10-CM

## 2012-09-02 ENCOUNTER — Ambulatory Visit (HOSPITAL_COMMUNITY): Payer: BC Managed Care – PPO

## 2012-09-05 ENCOUNTER — Ambulatory Visit (HOSPITAL_COMMUNITY): Payer: BC Managed Care – PPO

## 2012-09-19 ENCOUNTER — Encounter: Payer: Self-pay | Admitting: Internal Medicine

## 2012-09-22 ENCOUNTER — Ambulatory Visit (INDEPENDENT_AMBULATORY_CARE_PROVIDER_SITE_OTHER): Payer: BC Managed Care – PPO | Admitting: Gastroenterology

## 2012-09-22 ENCOUNTER — Ambulatory Visit (HOSPITAL_COMMUNITY)
Admission: RE | Admit: 2012-09-22 | Discharge: 2012-09-22 | Disposition: A | Discharge status: Home / Self Care | Payer: BC Managed Care – PPO | Source: Ambulatory Visit | Attending: Physician Assistant | Admitting: Physician Assistant

## 2012-09-22 ENCOUNTER — Encounter: Payer: Self-pay | Admitting: Gastroenterology

## 2012-09-22 VITALS — BP 116/81 | HR 96 | Temp 97.6°F | Ht 62.0 in | Wt 152.0 lb

## 2012-09-22 DIAGNOSIS — K594 Anal spasm: Secondary | ICD-10-CM | POA: Insufficient documentation

## 2012-09-22 DIAGNOSIS — Z139 Encounter for screening, unspecified: Secondary | ICD-10-CM

## 2012-09-22 DIAGNOSIS — Z1231 Encounter for screening mammogram for malignant neoplasm of breast: Secondary | ICD-10-CM | POA: Insufficient documentation

## 2012-09-22 DIAGNOSIS — K219 Gastro-esophageal reflux disease without esophagitis: Secondary | ICD-10-CM | POA: Insufficient documentation

## 2012-09-22 MED ORDER — PANTOPRAZOLE SODIUM 40 MG PO TBEC: 40.0000 mg | DELAYED_RELEASE_TABLET | Freq: Every day | ORAL | Status: DC

## 2012-09-22 NOTE — Assessment & Plan Note (Signed)
47 year old female with chronic, intermittent rectal pain unrelated to any aggravating factors. She notes this occuring sporadically at different time during the day, averaging several times per week. Worsening in frequency, only scant hematochezia noted recently. Episodes last from 2-25 minutes with no relieving factors. Last TCS by Dr. Karilyn Cota in 2007 with external hemorrhoids, otherwise normal. Needs an updated colonoscopy to evaluate lower GI tract and evaluate for any occult issues; may benefit from trial of nitroglycerin ointment if no etiology found per colonoscopy.   Proceed with TCS with Dr. Jena Gauss in near future: the risks, benefits, and alternatives have been discussed with the patient in detail. The patient states understanding and desires to proceed.

## 2012-09-22 NOTE — Assessment & Plan Note (Signed)
Chronic GERD, no dysphagia, only taking OTC agents currently. Likely dyspepsia secondary to gastritis. EGD in 2004 with mild esophagitis. Trial Protonix. Prescription sent to pharmacy.

## 2012-09-22 NOTE — Progress Notes (Signed)
Referring Provider: Deon Pilling Primary Care Physician:  Frazier Richards, PA-C Primary Gastroenterologist:  Dr. Jena Gauss   Chief Complaint  Patient presents with  . Rectal Pain    HPI:   Jessica Shaw is a pleasant 47 year old female who presents today at the request of her PCP secondary to rectal pain. Her last colonoscopy was by Dr. Karilyn Cota in 2007 with external hemorrhoids, otherwise normal. States rectal pain is so severe she has passed out. Affected sleep, sex life. Intermittent, not related to defecation, no aggravating factors, will last 2-25 minutes. In between episodes, no pain. Scant hematochezia noted recently. No constipation, diarrhea. States present since hemorrhoidectomy, which was at least several years ago. Used to happen once every 3 months, now occurring more frequently up to 4-6 times per week. Feels like someone is sticking a hot poker in her rectum.   +reflux, taking OTC acid reducer medication. +nocturnal reflux. Taken Prilosec in the past. No dysphagia. Will have epigastric pain if she doesn't drink water constantly. Drank soda and wound up in the ED.   Past Medical History  Diagnosis Date  . Embolism - blood clot     arm  . Stroke     TIA  . GERD (gastroesophageal reflux disease)     Past Surgical History  Procedure Laterality Date  . Shoulder arthroscopy      left  . Tubal ligation    . Cholecystectomy    . Spinal cord stimulator implant    . Patent foramen ovale closure    . Esophagogastroduodenoscopy  04/20/2003    ZOX:WRUE changes of reflux esophagitis/Limited gastroesophageal junction, otherwise normal  . Colonoscopy  08/04/2005    AVW:UJWJX external hemorrhoids and anal papilla, otherwise normal    Current Outpatient Prescriptions  Medication Sig Dispense Refill  . buPROPion (WELLBUTRIN SR) 150 MG 12 hr tablet Take 150 mg by mouth 2 (two) times daily.      . cyclobenzaprine (FLEXERIL) 10 MG tablet Take 10 mg by mouth 3 (three) times daily as  needed for muscle spasms.      Marland Kitchen ibuprofen (ADVIL,MOTRIN) 200 MG tablet Take 800 mg by mouth every 6 (six) hours as needed. Pain       No current facility-administered medications for this visit.    Allergies as of 09/22/2012 - Review Complete 09/22/2012  Allergen Reaction Noted  . Aspirin Other (See Comments) 10/16/2010  . Morphine and related Other (See Comments) 10/16/2010  . Latex Itching and Rash 10/16/2010    Family History  Problem Relation Age of Onset  . Cancer Mother     unsure primary, deceased at age 53  . Diabetes    . Colon cancer Neg Hx     History   Social History  . Marital Status: Married    Spouse Name: N/A    Number of Children: N/A  . Years of Education: N/A   Occupational History  . inspector    Social History Main Topics  . Smoking status: Current Every Day Smoker  . Smokeless tobacco: Not on file  . Alcohol Use: No  . Drug Use: No  . Sexually Active: Not on file   Other Topics Concern  . Not on file   Social History Narrative  . No narrative on file    Review of Systems: Negative unless mentioned in HPI  Physical Exam: BP 116/81  Pulse 96  Temp(Src) 97.6 F (36.4 C) (Oral)  Ht 5\' 2"  (1.575 m)  Wt 152 lb (68.947 kg)  BMI 27.79 kg/m2 General:   Alert and oriented. Well-developed, well-nourished, pleasant and cooperative. Head:  Normocephalic and atraumatic. Eyes:  Conjunctiva pink, sclera clear, no icterus.   Conjunctiva pink. Ears:  Normal auditory acuity. Nose:  No deformity, discharge,  or lesions. Mouth:  No deformity or lesions, mucosa pink and moist.  Neck:  Supple, without mass or thyromegaly. Lungs:  Clear to auscultation bilaterally, without wheezing, rales, or rhonchi.  Heart:  S1, S2 present without murmurs noted.  Abdomen:  +BS, soft, non-tender and non-distended. Without mass or HSM. No rebound or guarding. No hernias noted. Rectal:  Deferred until colonoscopy Msk:  Symmetrical without gross deformities. Normal  posture. Extremities:  Without clubbing or edema. Neurologic:  Alert and  oriented x4;  grossly normal neurologically. Skin:  Intact, warm and dry without significant lesions or rashes Cervical Nodes:  No significant cervical adenopathy. Psych:  Alert and cooperative. Normal mood and affect.

## 2012-09-22 NOTE — Patient Instructions (Addendum)
We have scheduled you for a colonoscopy with Dr. Rourk in the near future.  Further recommendations to follow!   

## 2012-09-22 NOTE — Progress Notes (Signed)
Cc PCP 

## 2012-09-26 ENCOUNTER — Encounter (HOSPITAL_COMMUNITY): Payer: Self-pay | Admitting: Pharmacy Technician

## 2012-09-28 ENCOUNTER — Emergency Department (HOSPITAL_COMMUNITY)
Admission: EM | Admit: 2012-09-28 | Discharge: 2012-09-28 | Disposition: A | Discharge status: Home / Self Care | Payer: BC Managed Care – PPO | Attending: Emergency Medicine | Admitting: Emergency Medicine

## 2012-09-28 ENCOUNTER — Encounter (HOSPITAL_COMMUNITY): Payer: Self-pay | Admitting: Emergency Medicine

## 2012-09-28 DIAGNOSIS — Z8673 Personal history of transient ischemic attack (TIA), and cerebral infarction without residual deficits: Secondary | ICD-10-CM | POA: Insufficient documentation

## 2012-09-28 DIAGNOSIS — Z86718 Personal history of other venous thrombosis and embolism: Secondary | ICD-10-CM | POA: Insufficient documentation

## 2012-09-28 DIAGNOSIS — F17200 Nicotine dependence, unspecified, uncomplicated: Secondary | ICD-10-CM | POA: Insufficient documentation

## 2012-09-28 DIAGNOSIS — R110 Nausea: Secondary | ICD-10-CM | POA: Insufficient documentation

## 2012-09-28 DIAGNOSIS — H53149 Visual discomfort, unspecified: Secondary | ICD-10-CM | POA: Insufficient documentation

## 2012-09-28 DIAGNOSIS — G43909 Migraine, unspecified, not intractable, without status migrainosus: Secondary | ICD-10-CM | POA: Insufficient documentation

## 2012-09-28 DIAGNOSIS — Z8719 Personal history of other diseases of the digestive system: Secondary | ICD-10-CM | POA: Insufficient documentation

## 2012-09-28 MED ORDER — KETOROLAC TROMETHAMINE 60 MG/2ML IM SOLN
60.0000 mg | Freq: Once | INTRAMUSCULAR | Status: AC
  Administered 2012-09-28: 60 mg via INTRAMUSCULAR
  Filled 2012-09-28: qty 2

## 2012-09-28 MED ORDER — DIPHENHYDRAMINE HCL 25 MG PO CAPS
25.0000 mg | ORAL_CAPSULE | Freq: Once | ORAL | Status: AC
  Administered 2012-09-28: 25 mg via ORAL
  Filled 2012-09-28: qty 1

## 2012-09-28 MED ORDER — METOCLOPRAMIDE HCL 5 MG/ML IJ SOLN
10.0000 mg | Freq: Once | INTRAMUSCULAR | Status: AC
  Administered 2012-09-28: 10 mg via INTRAMUSCULAR
  Filled 2012-09-28: qty 2

## 2012-09-28 NOTE — ED Notes (Signed)
Pt c/o headache with nausea. Pt states she has a history of migraines.

## 2012-09-28 NOTE — ED Provider Notes (Signed)
History     CSN: [id number]  Arrival date & time 09/28/12  2152   First MD Initiated Contact with Patient 09/28/12 2202      Chief Complaint  Patient presents with  . Headache  . Nausea    (Consider location/radiation/quality/duration/timing/severity/associated sxs/prior treatment) Patient is a 47 y.o. female presenting with headaches. The history is provided by the patient.  Headache Pain location:  R parietal, R temporal and frontal Quality:  Dull (throbbing) Radiates to:  Does not radiate Severity currently:  9/10 Severity at highest:  9/10 Onset quality:  Gradual Duration:  3 days Timing:  Constant Progression:  Worsening Chronicity:  Recurrent Similar to prior headaches: yes   Context: bright light and loud noise   Context: not activity, not eating and not straining   Relieved by:  Nothing Worsened by:  Light and sound Ineffective treatments:  Prescription medications (took 2 relpax today w/o relief) Associated symptoms: facial pain, nausea and photophobia   Associated symptoms: no abdominal pain, no back pain, no blurred vision, no cough, no dizziness, no ear pain, no pain, no fever, no focal weakness, no loss of balance, no near-syncope, no neck pain, no neck stiffness, no numbness, no paresthesias, no seizures, no sinus pressure, no sore throat, no syncope, no vomiting and no weakness   Risk factors: no family hx of SAH     Past Medical History  Diagnosis Date  . Embolism - blood clot     arm  . Stroke     TIA  . GERD (gastroesophageal reflux disease)     Past Surgical History  Procedure Laterality Date  . Shoulder arthroscopy      left  . Tubal ligation    . Cholecystectomy    . Spinal cord stimulator implant    . Patent foramen ovale closure    . Esophagogastroduodenoscopy  04/20/2003    NWG:NFAO changes of reflux esophagitis/Limited gastroesophageal junction, otherwise normal  . Colonoscopy  08/04/2005    ZHY:QMVHQ external hemorrhoids and anal  papilla, otherwise normal    Family History  Problem Relation Age of Onset  . Cancer Mother     unsure primary, deceased at age 16  . Diabetes    . Colon cancer Neg Hx     History  Substance Use Topics  . Smoking status: Current Every Day Smoker    Types: Cigarettes  . Smokeless tobacco: Not on file  . Alcohol Use: No    OB History   Grav Para Term Preterm Abortions TAB SAB Ect Mult Living                  Review of Systems  Constitutional: Negative for fever, activity change and appetite change.  HENT: Negative for ear pain, sore throat, facial swelling, trouble swallowing, neck pain, neck stiffness and sinus pressure.   Eyes: Positive for photophobia. Negative for blurred vision, pain and visual disturbance.  Respiratory: Negative for cough and shortness of breath.   Cardiovascular: Negative for chest pain, syncope and near-syncope.  Gastrointestinal: Positive for nausea. Negative for vomiting and abdominal pain.  Musculoskeletal: Negative for back pain.  Skin: Negative for rash and wound.  Neurological: Positive for headaches. Negative for dizziness, focal weakness, seizures, syncope, facial asymmetry, speech difficulty, weakness, numbness, paresthesias and loss of balance.  Psychiatric/Behavioral: Negative for confusion and decreased concentration.  All other systems reviewed and are negative.    Allergies  Aspirin; Morphine and related; and Latex  Home Medications   Current Outpatient Rx  Name  Route  Sig  Dispense  Refill  . eletriptan (RELPAX) 40 MG tablet   Oral   One tablet by mouth at onset of headache. May repeat in 2 hours if headache persists or recurs. may repeat in 2 hours if necessary         . ibuprofen (ADVIL,MOTRIN) 200 MG tablet   Oral   Take 800 mg by mouth every 6 (six) hours as needed. Pain           BP 142/91  Pulse 89  Temp(Src) 97.5 F (36.4 C) (Oral)  Resp 20  Ht 5\' 2"  (1.575 m)  Wt 150 lb (68.04 kg)  BMI 27.43 kg/m2   SpO2 98%  Physical Exam  Nursing note and vitals reviewed. Constitutional: She is oriented to person, place, and time. She appears well-developed and well-nourished. No distress.  HENT:  Head: Normocephalic and atraumatic.  Mouth/Throat: Oropharynx is clear and moist.  Eyes: Conjunctivae and EOM are normal. Pupils are equal, round, and reactive to light.  Neck: Normal range of motion and phonation normal. Neck supple. No rigidity. No Brudzinski's sign and no Kernig's sign noted.  Cardiovascular: Normal rate, regular rhythm, normal heart sounds and intact distal pulses.   No murmur heard. Pulmonary/Chest: Effort normal and breath sounds normal. No respiratory distress.  Abdominal: Soft. She exhibits no distension. There is no tenderness.  Musculoskeletal: Normal range of motion.  Neurological: She is alert and oriented to person, place, and time. No cranial nerve deficit or sensory deficit. She exhibits normal muscle tone. Coordination and gait normal.  Reflex Scores:      Tricep reflexes are 2+ on the right side and 2+ on the left side.      Bicep reflexes are 2+ on the right side and 2+ on the left side.      Patellar reflexes are 2+ on the right side and 2+ on the left side.      Achilles reflexes are 2+ on the right side and 2+ on the left side. Skin: Skin is warm and dry.    ED Course  Procedures (including critical care time)  Labs Reviewed - No data to display No results found.   Will treat with IM Toradol and reglan and po Benadryl   MDM    11:20 PM patient is feeling better, headache improved.  Requesting to go home.  Vitals stable,  Pt is non-toxic appearing.  No focal neuro or sensory deficits, no meningeal signs.  Ambulates with a steady gait.  Headache of gradual onset that is similar to previous.  Patient agrees to close f/u with PMD or to return here if the symptoms worsen.   The patient appears reasonably screened and/or stabilized for discharge and I doubt any  other medical condition or other [facility] requiring further screening, evaluation, or treatment in the ED at this time prior to discharge.     Lakynn Halvorsen L. Trisha Mangle, PA-C 09/28/12 2335

## 2012-09-28 NOTE — ED Notes (Signed)
Ice pack to patient - pain primarily on right side of head

## 2012-09-30 NOTE — ED Provider Notes (Signed)
Medical screening examination/treatment/procedure(s) were performed by non-physician practitioner and as supervising physician I was immediately available for consultation/collaboration.   Kamryn Messineo L Barclay Lennox, MD 09/30/12 1240 

## 2012-10-03 ENCOUNTER — Telehealth: Payer: Self-pay

## 2012-10-03 DIAGNOSIS — R1013 Epigastric pain: Secondary | ICD-10-CM

## 2012-10-03 NOTE — Telephone Encounter (Signed)
Pt called-left voicemail- she said she had tried the pantoprazole that AS gave her and its not helping at all. Pt is scheduled for a tcs on Friday with RMR and wants to know if we can add on an EGD? Please advise.

## 2012-10-03 NOTE — Telephone Encounter (Signed)
Pt stated she is having epigastric pain, she said the protonix made it 10x worse. Do you want to add egd?

## 2012-10-03 NOTE — Telephone Encounter (Signed)
Is she having any dysphagia, epigastric pain?

## 2012-10-05 ENCOUNTER — Other Ambulatory Visit: Payer: Self-pay | Admitting: Internal Medicine

## 2012-10-05 ENCOUNTER — Other Ambulatory Visit: Payer: Self-pay

## 2012-10-05 DIAGNOSIS — R1013 Epigastric pain: Secondary | ICD-10-CM

## 2012-10-05 MED ORDER — PEG 3350-KCL-NA BICARB-NACL 420 G PO SOLR: 4000.0000 mL | ORAL | Status: DC

## 2012-10-05 MED ORDER — DEXLANSOPRAZOLE 60 MG PO CPDR: 60.0000 mg | DELAYED_RELEASE_CAPSULE | Freq: Every day | ORAL | Status: DC

## 2012-10-05 NOTE — Addendum Note (Signed)
Addended by: Myra Rude on: 10/05/2012 04:24 PM   Modules accepted: Orders

## 2012-10-05 NOTE — Telephone Encounter (Signed)
Tried to call pt- LMOM. Lab order faxed to lab.

## 2012-10-05 NOTE — Telephone Encounter (Signed)
You will need to talk to Dr. Jena Gauss about adding this on at this point she is scheduled for Friday the 25 and he is completely full with no room

## 2012-10-05 NOTE — Telephone Encounter (Signed)
Jessica Shaw, can you add on egd?

## 2012-10-05 NOTE — Telephone Encounter (Signed)
Jessica Shaw spoke with Jessica Shaw in Endo, she said she would take care of changing the orders to a tcs/egd and calling all the pts .

## 2012-10-05 NOTE — Telephone Encounter (Signed)
If there is ability to add on EGD (if there is a time slot available), then yes. I would recommend EGD for further evaluation.  Switch to Dexilant in interim. I sent rx to pharmacy. Let's get a lipase on file as well. May need to postpone TCS if unable to add EGD at time of TCS.

## 2012-10-06 NOTE — Progress Notes (Signed)
Add EGD due to dyspepsia, not improved with PPI. Gallbladder absent. EGD has been added to TCS, patient aware of risks and benefits, which were discussed at appt.

## 2012-10-07 ENCOUNTER — Encounter (HOSPITAL_COMMUNITY): Payer: Self-pay

## 2012-10-07 ENCOUNTER — Ambulatory Visit (HOSPITAL_COMMUNITY)
Admission: RE | Admit: 2012-10-07 | Discharge: 2012-10-07 | Disposition: A | Discharge status: Home / Self Care | Payer: BC Managed Care – PPO | Source: Ambulatory Visit | Attending: Internal Medicine | Admitting: Internal Medicine

## 2012-10-07 ENCOUNTER — Encounter (HOSPITAL_COMMUNITY)
Admission: RE | Disposition: A | Discharge status: Home / Self Care | Payer: Self-pay | Source: Ambulatory Visit | Attending: Internal Medicine

## 2012-10-07 DIAGNOSIS — R1013 Epigastric pain: Secondary | ICD-10-CM

## 2012-10-07 DIAGNOSIS — D126 Benign neoplasm of colon, unspecified: Secondary | ICD-10-CM | POA: Insufficient documentation

## 2012-10-07 DIAGNOSIS — K269 Duodenal ulcer, unspecified as acute or chronic, without hemorrhage or perforation: Secondary | ICD-10-CM

## 2012-10-07 DIAGNOSIS — K3189 Other diseases of stomach and duodenum: Secondary | ICD-10-CM | POA: Insufficient documentation

## 2012-10-07 DIAGNOSIS — K219 Gastro-esophageal reflux disease without esophagitis: Secondary | ICD-10-CM

## 2012-10-07 DIAGNOSIS — K6289 Other specified diseases of anus and rectum: Secondary | ICD-10-CM | POA: Insufficient documentation

## 2012-10-07 DIAGNOSIS — K644 Residual hemorrhoidal skin tags: Secondary | ICD-10-CM | POA: Insufficient documentation

## 2012-10-07 DIAGNOSIS — D128 Benign neoplasm of rectum: Secondary | ICD-10-CM | POA: Insufficient documentation

## 2012-10-07 DIAGNOSIS — K2960 Other gastritis without bleeding: Secondary | ICD-10-CM

## 2012-10-07 DIAGNOSIS — K319 Disease of stomach and duodenum, unspecified: Secondary | ICD-10-CM | POA: Insufficient documentation

## 2012-10-07 DIAGNOSIS — K449 Diaphragmatic hernia without obstruction or gangrene: Secondary | ICD-10-CM | POA: Insufficient documentation

## 2012-10-07 DIAGNOSIS — K648 Other hemorrhoids: Secondary | ICD-10-CM | POA: Insufficient documentation

## 2012-10-07 DIAGNOSIS — K594 Anal spasm: Secondary | ICD-10-CM

## 2012-10-07 DIAGNOSIS — K621 Rectal polyp: Secondary | ICD-10-CM

## 2012-10-07 DIAGNOSIS — K625 Hemorrhage of anus and rectum: Secondary | ICD-10-CM | POA: Insufficient documentation

## 2012-10-07 DIAGNOSIS — D129 Benign neoplasm of anus and anal canal: Secondary | ICD-10-CM | POA: Insufficient documentation

## 2012-10-07 DIAGNOSIS — K620 Anal polyp: Secondary | ICD-10-CM

## 2012-10-07 HISTORY — DX: Other specified postprocedural states: Z98.890

## 2012-10-07 HISTORY — DX: Adverse effect of unspecified anesthetic, initial encounter: T41.45XA

## 2012-10-07 HISTORY — DX: Nausea with vomiting, unspecified: R11.2

## 2012-10-07 HISTORY — DX: Other complications of anesthesia, initial encounter: T88.59XA

## 2012-10-07 HISTORY — PX: COLONOSCOPY: SHX5424

## 2012-10-07 HISTORY — PX: ESOPHAGOGASTRODUODENOSCOPY: SHX5428

## 2012-10-07 SURGERY — COLONOSCOPY
Anesthesia: Moderate Sedation

## 2012-10-07 MED ORDER — MIDAZOLAM HCL 5 MG/5ML IJ SOLN
INTRAMUSCULAR | Status: AC
  Filled 2012-10-07: qty 10

## 2012-10-07 MED ORDER — ONDANSETRON HCL 4 MG/2ML IJ SOLN
INTRAMUSCULAR | Status: AC
  Filled 2012-10-07: qty 2

## 2012-10-07 MED ORDER — STERILE WATER FOR IRRIGATION IR SOLN
Status: DC | PRN
  Administered 2012-10-07: 10:00:00

## 2012-10-07 MED ORDER — ONDANSETRON HCL 4 MG/2ML IJ SOLN
INTRAMUSCULAR | Status: DC | PRN
  Administered 2012-10-07: 4 mg via INTRAVENOUS

## 2012-10-07 MED ORDER — MEPERIDINE HCL 100 MG/ML IJ SOLN
INTRAMUSCULAR | Status: DC | PRN
  Administered 2012-10-07 (×2): 50 mg via INTRAVENOUS
  Administered 2012-10-07: 25 mg via INTRAVENOUS

## 2012-10-07 MED ORDER — BUTAMBEN-TETRACAINE-BENZOCAINE 2-2-14 % EX AERO
INHALATION_SPRAY | CUTANEOUS | Status: DC | PRN
  Administered 2012-10-07: 2 via TOPICAL

## 2012-10-07 MED ORDER — SODIUM CHLORIDE 0.9 % IV SOLN
INTRAVENOUS | Status: DC
  Administered 2012-10-07: 09:00:00 via INTRAVENOUS

## 2012-10-07 MED ORDER — MIDAZOLAM HCL 5 MG/5ML IJ SOLN
INTRAMUSCULAR | Status: DC | PRN
  Administered 2012-10-07 (×4): 2 mg via INTRAVENOUS

## 2012-10-07 MED ORDER — MEPERIDINE HCL 100 MG/ML IJ SOLN
INTRAMUSCULAR | Status: AC
  Filled 2012-10-07: qty 2

## 2012-10-07 NOTE — Interval H&P Note (Signed)
History and Physical Interval Note:  10/07/2012 9:44 AM  Jessica Shaw  has presented today for surgery, with the diagnosis of Proctalgia fugax and GERD  The various methods of treatment have been discussed with the patient and family. After consideration of risks, benefits and other options for treatment, the patient has consented to  Procedure(s) with comments: COLONOSCOPY (N/A) - 10:30 ESOPHAGOGASTRODUODENOSCOPY (EGD) (N/A) as a surgical intervention .  The patient's history has been reviewed, patient examined, no change in status, stable for surgery.  I have reviewed the patient's chart and labs.  Questions were answered to the patient's satisfaction.     Jessica Shaw  The risks, benefits, limitations, imponderables and alternatives regarding both EGD and colonoscopy have been reviewed with the patient. Questions have been answered. All parties agreeable.

## 2012-10-07 NOTE — Op Note (Signed)
[facility] [address] [place], [address]   ENDOSCOPY PROCEDURE REPORT  PATIENT: Jessica Shaw, Jessica Shaw  MR#: [id number] BIRTHDATE: 1966-01-01 , 46  yrs. old GENDER: Female ENDOSCOPIST: R. Roetta Sessions, MD FACP FACG REFERRED BY:  Frazier Richards, [name]. PROCEDURE DATE:  10/07/2012 PROCEDURE:     EGD with gastric biopsy  INDICATIONS:     Dyspepsia refractory to acid suppression therapy  INFORMED CONSENT:   The risks, benefits, limitations, alternatives and imponderables have been discussed.  The potential for biopsy, esophogeal dilation, etc. have also been reviewed.  Questions have been answered.  All parties agreeable.  Please see the history and physical in the medical record for more information.  MEDICATIONS:Versed 4 mg IV and Demerol 100 mg IV in divided doses. Zofran 4 mg IV. Cetacaine spray.  DESCRIPTION OF PROCEDURE:   The EG-[id number] ([id number])  endoscope was introduced through the mouth and advanced to the second portion of the duodenum without difficulty or limitations.  The mucosal surfaces were surveyed very carefully during advancement of the scope and upon withdrawal.  Retroflexion view of the proximal stomach and esophagogastric junction was performed.      FINDINGS:  Normal esophagus. Stomach empty. Small hiatal hernia. Numerous antral erosions. No ulcer or infiltrating process seen in the stomach. Patent pylorus. Examination of the duodenal bulb revealed a 6 mm ulcer in the apex with smaller satellite ulcers and erosions. Please see photos. The second portion of duodenum appeared normal.  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 Hiatal hernia.  RECOMMENDATIONS:   Begin Nexium 40 mg daily. Avoid all nonsteroidal agents. Followup on pathology.  See colonoscopy report.    _______________________________ R. Roetta Sessions, MD FACP [facility] eSigned:  R. Roetta Sessions, MD FACP [facility] [place] 10/07/2012 10:13 AM     CC:  PATIENT NAME:  Jessica Shaw, Jessica Shaw MR#: [id number]

## 2012-10-07 NOTE — H&P (View-Only) (Signed)
Referring Provider: Dixon, Mary B, PA-C Primary Care Physician:  DIXON,MARY BETH, PA-C Primary Gastroenterologist:  Dr. Rourk   Chief Complaint  Patient presents with  . Rectal Pain    HPI:   Ms. Shetley is a pleasant 46-year-old female who presents today at the request of her PCP secondary to rectal pain. Her last colonoscopy was by Dr. Rehman in 2007 with external hemorrhoids, otherwise normal. States rectal pain is so severe she has passed out. Affected sleep, sex life. Intermittent, not related to defecation, no aggravating factors, will last 2-25 minutes. In between episodes, no pain. Scant hematochezia noted recently. No constipation, diarrhea. States present since hemorrhoidectomy, which was at least several years ago. Used to happen once every 3 months, now occurring more frequently up to 4-6 times per week. Feels like someone is sticking a hot poker in her rectum.   +reflux, taking OTC acid reducer medication. +nocturnal reflux. Taken Prilosec in the past. No dysphagia. Will have epigastric pain if she doesn't drink water constantly. Drank soda and wound up in the ED.   Past Medical History  Diagnosis Date  . Embolism - blood clot     arm  . Stroke     TIA  . GERD (gastroesophageal reflux disease)     Past Surgical History  Procedure Laterality Date  . Shoulder arthroscopy      left  . Tubal ligation    . Cholecystectomy    . Spinal cord stimulator implant    . Patent foramen ovale closure    . Esophagogastroduodenoscopy  04/20/2003    NUR:Mild changes of reflux esophagitis/Limited gastroesophageal junction, otherwise normal  . Colonoscopy  08/04/2005    NUR:Small external hemorrhoids and anal papilla, otherwise normal    Current Outpatient Prescriptions  Medication Sig Dispense Refill  . buPROPion (WELLBUTRIN SR) 150 MG 12 hr tablet Take 150 mg by mouth 2 (two) times daily.      . cyclobenzaprine (FLEXERIL) 10 MG tablet Take 10 mg by mouth 3 (three) times daily as  needed for muscle spasms.      . ibuprofen (ADVIL,MOTRIN) 200 MG tablet Take 800 mg by mouth every 6 (six) hours as needed. Pain       No current facility-administered medications for this visit.    Allergies as of 09/22/2012 - Review Complete 09/22/2012  Allergen Reaction Noted  . Aspirin Other (See Comments) 10/16/2010  . Morphine and related Other (See Comments) 10/16/2010  . Latex Itching and Rash 10/16/2010    Family History  Problem Relation Age of Onset  . Cancer Mother     unsure primary, deceased at age 46  . Diabetes    . Colon cancer Neg Hx     History   Social History  . Marital Status: Married    Spouse Name: N/A    Number of Children: N/A  . Years of Education: N/A   Occupational History  . inspector    Social History Main Topics  . Smoking status: Current Every Day Smoker  . Smokeless tobacco: Not on file  . Alcohol Use: No  . Drug Use: No  . Sexually Active: Not on file   Other Topics Concern  . Not on file   Social History Narrative  . No narrative on file    Review of Systems: Negative unless mentioned in HPI  Physical Exam: BP 116/81  Pulse 96  Temp(Src) 97.6 F (36.4 C) (Oral)  Ht 5' 2" (1.575 m)  Wt 152 lb (68.947 kg)    BMI 27.79 kg/m2 General:   Alert and oriented. Well-developed, well-nourished, pleasant and cooperative. Head:  Normocephalic and atraumatic. Eyes:  Conjunctiva pink, sclera clear, no icterus.   Conjunctiva pink. Ears:  Normal auditory acuity. Nose:  No deformity, discharge,  or lesions. Mouth:  No deformity or lesions, mucosa pink and moist.  Neck:  Supple, without mass or thyromegaly. Lungs:  Clear to auscultation bilaterally, without wheezing, rales, or rhonchi.  Heart:  S1, S2 present without murmurs noted.  Abdomen:  +BS, soft, non-tender and non-distended. Without mass or HSM. No rebound or guarding. No hernias noted. Rectal:  Deferred until colonoscopy Msk:  Symmetrical without gross deformities. Normal  posture. Extremities:  Without clubbing or edema. Neurologic:  Alert and  oriented x4;  grossly normal neurologically. Skin:  Intact, warm and dry without significant lesions or rashes Cervical Nodes:  No significant cervical adenopathy. Psych:  Alert and cooperative. Normal mood and affect.   

## 2012-10-07 NOTE — Op Note (Signed)
[facility] [address] [place], [address]   COLONOSCOPY PROCEDURE REPORT  PATIENT: Jessica Shaw, Jessica Shaw  MR#:         [id number] BIRTHDATE: 01/21/1966 , 46  yrs. old GENDER: Female ENDOSCOPIST: R.  Roetta Sessions, MD FACP FACG REFERRED BY:  Frazier Richards, [name]. PROCEDURE DATE:  10/07/2012 PROCEDURE:     Colonoscopy with biopsy  INDICATIONS:  intermittent rectal bleeding. Intermittent severe rectal pain.o  INFORMED CONSENT:  The risks, benefits, alternatives and imponderables including but not limited to bleeding, perforation as well as the possibility of a missed lesion have been reviewed.  The potential for biopsy, lesion removal, etc. have also been discussed.  Questions have been answered.  All parties agreeable. Please see the history and physical in the medical record for more information.  MEDICATIONS: Versed 8 mg IV and Demerol 125 mg IV in divided doses. Zofran 4 mg IV.  DESCRIPTION OF PROCEDURE:  After a digital rectal exam was performed, the EC-[id number] ([id number])  colonoscope was advanced from the anus through the rectum and colon to the area of the cecum, ileocecal valve and appendiceal orifice.  The cecum was deeply intubated.  These structures were well-seen and photographed for the record.  From the level of the cecum and ileocecal valve, the scope was slowly and cautiously withdrawn.  The mucosal surfaces were carefully surveyed utilizing scope tip deflection to facilitate fold flattening as needed.  The scope was pulled down into the rectum where a thorough examination including retroflexion was performed.    FINDINGS:  Adequate preparation. Small internal and external hemorrhoids and anal papilla; (1) diminutive polyp in the rectum at 5 cm from anal verge; otherwise, the remainder of rectal mucosa appeared normal. The patient had (1) diminutive polyp in the base the cecum; otherwise, the remainder of colonic mucosa  appeared normal.  THERAPEUTIC / DIAGNOSTIC MANEUVERS PERFORMED:  The above-mentioned polyps were removed with cold biopsy forcep technique and submitted to the pathologist.  COMPLICATIONS: None  CECAL WITHDRAWAL TIME:  8 minutes  IMPRESSION:  Hemorrhoids and anal papilla; rectal and colonic polyps-removed as described above  RECOMMENDATIONS: patient's symptoms sound more like proctalgia fugax rather than an occult anal fissure. Begin generic AnaMantle forte cream-apply to the anorectum at bedtime  Albuterol inhaler 2 puffs when necessary at onset of rectal pain to abort attack. Followup on pathology.  Avoid all nonsteroidal agents  Patient's husband reports to me the patient "chews up Aleve" frequently).  See EGD report   _______________________________ eSigned:  R. Roetta Sessions, MD FACP [facility] 10/07/2012 10:45 AM   CC:    PATIENT NAME:  [name], [name] MR#: [id number]

## 2012-10-11 ENCOUNTER — Encounter (HOSPITAL_COMMUNITY): Payer: Self-pay | Admitting: Internal Medicine

## 2012-10-12 ENCOUNTER — Encounter: Payer: Self-pay | Admitting: Internal Medicine

## 2013-02-22 ENCOUNTER — Telehealth: Payer: Self-pay | Admitting: Physician Assistant

## 2013-02-22 MED ORDER — ELETRIPTAN HYDROBROMIDE 40 MG PO TABS: 40.0000 mg | ORAL_TABLET | Freq: Once | ORAL | Status: DC | PRN

## 2013-02-22 NOTE — Telephone Encounter (Signed)
Rx Refilled  

## 2013-02-22 NOTE — Telephone Encounter (Signed)
Relpax 40 mg tab 1 at onset of headache, may repeat q2 hours prn, max of 2 tabs per 24 hours #9 last rf 10/08/12

## 2013-08-07 ENCOUNTER — Ambulatory Visit (INDEPENDENT_AMBULATORY_CARE_PROVIDER_SITE_OTHER): Payer: BC Managed Care – PPO | Admitting: Physician Assistant

## 2013-08-07 ENCOUNTER — Encounter: Payer: Self-pay | Admitting: Physician Assistant

## 2013-08-07 VITALS — BP 114/76 | HR 96 | Temp 98.5°F | Resp 18 | Ht 61.5 in | Wt 152.0 lb

## 2013-08-07 DIAGNOSIS — F17200 Nicotine dependence, unspecified, uncomplicated: Secondary | ICD-10-CM

## 2013-08-07 DIAGNOSIS — N951 Menopausal and female climacteric states: Secondary | ICD-10-CM

## 2013-08-07 DIAGNOSIS — R232 Flushing: Secondary | ICD-10-CM

## 2013-08-08 LAB — FOLLICLE STIMULATING HORMONE: FSH: 80.1 m[IU]/mL

## 2013-08-08 LAB — LUTEINIZING HORMONE: LH: 46.9 m[IU]/mL

## 2013-08-08 LAB — TSH: TSH: 1.308 u[IU]/mL (ref 0.350–4.500)

## 2013-08-08 NOTE — Progress Notes (Addendum)
Patient ID: BERLYNN WARSAME MRN: [id number], DOB: April 17, 1966, 48 y.o. Date of Encounter: @DATE @  Chief Complaint:  Chief Complaint  Patient presents with  . x 1 1 1/2 mth    terrible hot flashes    HPI: 48 y.o. year old white female  presents with complaint of hot flashes. This just started all of a sudden in the past 3 weeks. But says that she is having severe hot flashes. Says at night she is waking up throughout the night secondary to her clothes and bedding making her so hot. Says multiple nights she ends up having to take her clothes off during the night because she is in a sweat. She keeps a cup of ice water by her bed to drink through the night. Has even had to get up and take a shower in the middle of the night. Is getting decreased sleep secondary to this. She also has problems with this during the day all day long. Reports that she had a uterine ablation around June 2012. This was performed secondary to heavy bleeding. Has had no menstrual bleeding since the ablation. Therefore has no idea whether she's gone through menopause regarding bleeding cycles. She has noticed no significant weight change, no palpitation, no significant change in hair or skin. Has had no fever and no cough.  Past Medical History  Diagnosis Date  . Embolism - blood clot     arm  . Stroke     TIA  . GERD (gastroesophageal reflux disease)   . Complication of anesthesia   . PONV (postoperative nausea and vomiting)      Home Meds: See attached medication section for current medication list. Any medications entered into computer today will not appear on this note's list. The medications listed below were entered prior to today. Current Outpatient Prescriptions on File Prior to Visit  Medication Sig Dispense Refill  . eletriptan (RELPAX) 40 MG tablet Take 1 tablet (40 mg total) by mouth once as needed for migraine. may repeat in 2 hours if necessary  10 tablet  2  . ibuprofen (ADVIL,MOTRIN) 200 MG  tablet Take 800 mg by mouth every 6 (six) hours as needed. Pain       No current facility-administered medications on file prior to visit.    Allergies:  Allergies  Allergen Reactions  . Aspirin Other (See Comments)    G.I. Upset   . Morphine And Related Other (See Comments)    G.I. Upset  . Latex Itching and Rash    History   Social History  . Marital Status: Married    Spouse Name: N/A    Number of Children: N/A  . Years of Education: N/A   Occupational History  . inspector    Social History Main Topics  . Smoking status: Current Every Day Smoker    Types: Cigarettes  . Smokeless tobacco: Not on file  . Alcohol Use: No  . Drug Use: No  . Sexual Activity: Not on file   Other Topics Concern  . Not on file   Social History Narrative  . No narrative on file    Family History  Problem Relation Age of Onset  . Cancer Mother     unsure primary, deceased at age 67  . Diabetes    . Colon cancer Neg Hx      Review of Systems:  See HPI for pertinent ROS. All other ROS negative.    Physical Exam: Blood pressure 114/76, pulse 96,  temperature 98.5 F (36.9 C), temperature source Oral, resp. rate 18, height 5' 1.5" (1.562 m), weight 152 lb (68.947 kg)., Body mass index is 28.26 kg/(m^2). General: WNWD WF. Appears in no acute distress. Neck: Supple. No thyromegaly. No lymphadenopathy. Lungs: Clear bilaterally to auscultation without wheezes, rales, or rhonchi. Breathing is unlabored. Heart: RRR with S1 S2. No murmurs, rubs, or gallops. Musculoskeletal:  Strength and tone normal for age. Extremities/Skin: Warm and dry.  Neuro: Alert and oriented X 3. Moves all extremities spontaneously. Gait is normal. CNII-XII grossly in tact. Psych:  Responds to questions appropriately with a normal affect.     ASSESSMENT AND PLAN:  48 y.o. year old female with  1. Hot flashes - TSH - Follicle stimulating hormone - Luteinizing hormone  2. Smoker Smokes one pack per day.  Since July has decreased down to about 10 cigarettes per day but also using the E-cig. Says that she use Chantix in the past but was unsuccessful in quitting. Is not interested in trying this or any other prescription medication for cessation at this time.  Discussed with her that we will check labs as above. If TSH is normal but her [place] and LH do indicate menopause, then we could discuss options for treating hot flashes. Today I went ahead and discussed with her the options so that we can begin that conversation. Option 1: HRT Discussed that risks include: A. risk of blood clot/CAD/CVD ---Smoker; discussed that this would increase this risk and that she really would need smoking cessation for [place] to further discuss this option ---History states h/o 'blood clot arm" and h/o TIA  However, she reports that this all happened when she had a tear in her left bicep tendon. States that this injury occurred but she continued to work and therefore developed a lot of swelling in the arm and with this compression this is what caused the blood clot in the arm. States that she has had to have multiple surgeries to that left arm since then. Also, as it turns out, she ended up having a PF O. which has been closed. This was the cause of the TIA. Underlying cause has been fixed. --She has no other personal history of CAD or PVD. --No family history of premature CAD or PVD  B. discussed that hormone replacement therapy does not actually cause breast or uterine cancer but can speed up their growth if cancer  Present. She reports that she does have a history of cervical dysplasia and had what sounds like a LEEP procedure done to this. However had no Cervical Cancer.  No h/o uterine cancer or breast cancer or ovarian cancer. She has no  family history of any of these GYN cancers She is going to schedule a complete physical exam  so that we can perform breast exam pelvic exam and make sure her mammogram is  up-to-date  Option 2:  Venlafaxine or other SSRI.  She is to schedule for complete physical exam in the next one to 2 weeks and we will further discuss treatment of hot flashes at that visit if the labs are consistent with hot flashes.   Signed, [address] [place], [place], [facility] 08/08/2013 7:10 AM

## 2013-08-11 ENCOUNTER — Telehealth: Payer: Self-pay | Admitting: Family Medicine

## 2013-08-11 DIAGNOSIS — G43909 Migraine, unspecified, not intractable, without status migrainosus: Secondary | ICD-10-CM

## 2013-08-11 MED ORDER — ELETRIPTAN HYDROBROMIDE 40 MG PO TABS: 40.0000 mg | ORAL_TABLET | Freq: Once | ORAL | Status: DC | PRN

## 2013-08-11 NOTE — Telephone Encounter (Signed)
Medication refilled per protocol. 

## 2013-08-14 ENCOUNTER — Encounter: Payer: Self-pay | Admitting: Physician Assistant

## 2013-08-14 ENCOUNTER — Ambulatory Visit (INDEPENDENT_AMBULATORY_CARE_PROVIDER_SITE_OTHER): Payer: BC Managed Care – PPO | Admitting: Physician Assistant

## 2013-08-14 VITALS — BP 126/80 | HR 76 | Temp 98.2°F | Resp 18 | Ht 60.5 in | Wt 150.0 lb

## 2013-08-14 DIAGNOSIS — N951 Menopausal and female climacteric states: Secondary | ICD-10-CM

## 2013-08-14 DIAGNOSIS — F17200 Nicotine dependence, unspecified, uncomplicated: Secondary | ICD-10-CM

## 2013-08-14 DIAGNOSIS — Z0000 Encounter for general adult medical examination without abnormal findings: Secondary | ICD-10-CM

## 2013-08-14 DIAGNOSIS — G43909 Migraine, unspecified, not intractable, without status migrainosus: Secondary | ICD-10-CM

## 2013-08-14 DIAGNOSIS — Z23 Encounter for immunization: Secondary | ICD-10-CM

## 2013-08-14 LAB — COMPLETE METABOLIC PANEL WITH GFR
ALK PHOS: 47 U/L (ref 39–117)
ALT: 16 U/L (ref 0–35)
AST: 12 U/L (ref 0–37)
Albumin: 4.6 g/dL (ref 3.5–5.2)
BILIRUBIN TOTAL: 0.5 mg/dL (ref 0.2–1.2)
BUN: 18 mg/dL (ref 6–23)
CO2: 27 meq/L (ref 19–32)
CREATININE: 0.61 mg/dL (ref 0.50–1.10)
Calcium: 9.8 mg/dL (ref 8.4–10.5)
Chloride: 103 mEq/L (ref 96–112)
GLUCOSE: 89 mg/dL (ref 70–99)
Potassium: 4.4 mEq/L (ref 3.5–5.3)
Sodium: 140 mEq/L (ref 135–145)
TOTAL PROTEIN: 7.4 g/dL (ref 6.0–8.3)

## 2013-08-14 LAB — CBC WITH DIFFERENTIAL/PLATELET
BASOS PCT: 0 % (ref 0–1)
Basophils Absolute: 0 10*3/uL (ref 0.0–0.1)
EOS ABS: 0.3 10*3/uL (ref 0.0–0.7)
EOS PCT: 2 % (ref 0–5)
HCT: 43.6 % (ref 36.0–46.0)
HEMOGLOBIN: 15.1 g/dL — AB (ref 12.0–15.0)
LYMPHS ABS: 4.7 10*3/uL — AB (ref 0.7–4.0)
Lymphocytes Relative: 31 % (ref 12–46)
MCH: 30.1 pg (ref 26.0–34.0)
MCHC: 34.6 g/dL (ref 30.0–36.0)
MCV: 87 fL (ref 78.0–100.0)
MONOS PCT: 4 % (ref 3–12)
Monocytes Absolute: 0.6 10*3/uL (ref 0.1–1.0)
Neutro Abs: 9.5 10*3/uL — ABNORMAL HIGH (ref 1.7–7.7)
Neutrophils Relative %: 63 % (ref 43–77)
Platelets: 325 10*3/uL (ref 150–400)
RBC: 5.01 MIL/uL (ref 3.87–5.11)
RDW: 13.8 % (ref 11.5–15.5)
WBC: 15.1 10*3/uL — ABNORMAL HIGH (ref 4.0–10.5)

## 2013-08-14 LAB — LIPID PANEL
CHOLESTEROL: 295 mg/dL — AB (ref 0–200)
HDL: 43 mg/dL (ref >39)
LDL Cholesterol: 207 mg/dL — ABNORMAL HIGH (ref 0–99)
TRIGLYCERIDES: 226 mg/dL — AB (ref <150)
Total CHOL/HDL Ratio: 6.9 Ratio
VLDL: 45 mg/dL — ABNORMAL HIGH (ref 0–40)

## 2013-08-14 MED ORDER — VENLAFAXINE HCL ER 37.5 MG PO CP24: 37.5000 mg | ORAL_CAPSULE | Freq: Every day | ORAL | Status: DC

## 2013-08-15 ENCOUNTER — Encounter: Payer: Self-pay | Admitting: Physician Assistant

## 2013-08-15 LAB — VITAMIN D 25 HYDROXY (VIT D DEFICIENCY, FRACTURES): VIT D 25 HYDROXY: 36 ng/mL (ref 30–89)

## 2013-08-15 NOTE — Progress Notes (Signed)
Patient ID: Jessica Shaw MRN: [id number], DOB: 14-Oct-1965, 48 y.o. Date of Encounter: 08/15/2013,   Chief Complaint: Physical (CPE)  HPI: 48 y.o. y/o white female  here for CPE.   She just had an office visit with me 08/07/13 secondary to recent onset of hot flashes. The following is copied from that office visit note:  " This just started all of a sudden in the past 3 weeks. But says that she is having severe hot flashes. Says at night she is waking up throughout the night secondary to her clothes and bedding making her so hot. Says multiple nights she ends up having to take her clothes off during the night because she is in a sweat. She keeps a cup of ice water by her bed to drink through the night. Has even had to get up and take a shower in the middle of the night. Is getting decreased sleep secondary to this. She also has problems with this during the day all day long.  Reports that she had a uterine ablation around June 2012. This was performed secondary to heavy bleeding. Has had no menstrual bleeding since the ablation. Therefore has no idea whether she's gone through menopause regarding bleeding cycles.  She has noticed no significant weight change, no palpitation, no significant change in hair or skin.  Has had no fever and no cough. secondary to recent onset of hot flashes. The following is copied from that office visit note:  " This just started all of a sudden in the past 3 weeks. But says that she is having severe hot flashes. Says at night she is waking up throughout the night secondary to her clothes and bedding making her so hot. Says multiple nights she ends up having to take her clothes off during the night because she is in a sweat. She keeps a cup of ice water by her bed to drink through the night. Has even had to get up and take a shower in the middle of the night. Is getting decreased sleep secondary to this. She also has problems with this during the day all day long.  Reports that she had a uterine ablation around June 2012. This was performed secondary to heavy bleeding. Has had no menstrual bleeding since the ablation. Therefore has no idea whether she's gone through menopause regarding bleeding cycles.  She has noticed no significant weight change, no palpitation, no significant change in hair or skin.  Has had no fever and no cough."  At that visit we discussed:  (Below is copied from that note dated 08/07/13): Discussed with her that we will check labs--FSH, LH, TSH.  If TSH is normal but her [place] and LH do indicate menopause, then we could discuss options for treating hot flashes.  Today I went ahead and discussed with her the options so that we can begin that conversation.  Option 1: HRT  Discussed that risks include:  A. risk of blood clot/CAD/CVD  ---Smoker; discussed that this would increase this risk and that she really would need smoking cessation for [place] to further discuss this option  ---History states h/o 'blood clot arm" and h/o TIA  However, she reports that this all  happened when she had a tear in her left bicep tendon. States that this injury occurred but she continued to work and therefore developed a lot of swelling in the arm and with this compression this is what caused the blood clot in the arm. States that she has had to have multiple surgeries to that left arm since then.  Also, as it turns out, she ended up having a PF O. which has been closed.  This was the cause of the TIA. Underlying cause has been fixed.  --She has no other personal history of CAD or PVD.  --No family history of premature CAD or PVD  B. discussed that hormone replacement therapy does not actually cause breast or uterine cancer but can speed up their growth if cancer Present.  She reports that she does have a history of cervical dysplasia and had what sounds like a LEEP procedure done to this. However had no Cervical Cancer.  No h/o uterine cancer or breast cancer or ovarian cancer.  She has no family history of any of these GYN cancers  She is going to schedule a complete physical exam so that we can perform breast exam pelvic exam and make sure her mammogram is up-to-date  Option 2: Venlafaxine or other SSRI.  She is to schedule for complete physical exam in the next one to 2 weeks and we will further discuss treatment  of hot flashes at that visit if the labs are consistent with hot flashes.    she now presents for followup. She says that she has thought things over and is concerned about starting hormone replacement therapy given her risk factor profile. Does want to start the venlafaxine. Otherwise, she has no new complaints today. Just here to get her physical/preventive care caught up.     Review of Systems: Consitutional: No fever, chills, fatigue, lymphadenopathy. No significant/unexplained weight changes. Eyes: No visual changes, eye redness, or discharge. ENT/Mouth: No ear pain, sore throat, nasal drainage, or sinus pain. Cardiovascular: No chest pressure,heaviness,  tightness or squeezing, even with exertion. No increased shortness of breath or dyspnea on exertion.No palpitations, edema, orthopnea, PND. Respiratory: No cough, hemoptysis, SOB, or wheezing. Gastrointestinal: No anorexia, dysphagia, reflux, pain, nausea, vomiting, hematemesis, diarrhea, constipation, BRBPR, or melena. Breast: No mass, nodules, bulging, or retraction. No skin changes or inflammation. No nipple discharge. No lymphadenopathy. Genitourinary: No dysuria, hematuria, incontinence, vaginal discharge, pruritis, burning, abnormal bleeding, or pain. Musculoskeletal: No decreased ROM, No joint pain or swelling. No significant pain in neck, back, or extremities. Skin: No rash, pruritis, or concerning lesions. Neurological: No headache, dizziness, syncope, seizures, tremors, memory loss, coordination problems, or paresthesias. Psychological: No anxiety, depression, hallucinations, SI/HI. Endocrine: No polydipsia, polyphagia, polyuria, or known diabetes.No increased fatigue. No palpitations/rapid heart rate. No significant/unexplained weight change. All other systems were reviewed and are otherwise negative.  Past Medical History  Diagnosis Date  . Embolism - blood clot     arm  . Stroke     TIA  . GERD (gastroesophageal reflux disease)   . Complication of anesthesia   . PONV (postoperative nausea and vomiting)   . Migraine      Past Surgical History  Procedure Laterality Date  . Shoulder arthroscopy      left  . Tubal ligation    . Cholecystectomy    . Spinal cord stimulator implant    . Patent foramen ovale closure    . Esophagogastroduodenoscopy  04/20/2003    [id number] changes of reflux esophagitis/Limited gastroesophageal junction, otherwise normal  . Colonoscopy  08/04/2005    [id number] external hemorrhoids and anal papilla, otherwise normal  . Colonoscopy N/A 10/07/2012    Procedure: COLONOSCOPY;  Surgeon: Daneil Dolin, MD;  Location: [facility];  Service:  Endoscopy;  Laterality: N/A;  10:30  . Esophagogastroduodenoscopy N/A 10/07/2012    Procedure: ESOPHAGOGASTRODUODENOSCOPY (EGD);  Surgeon: Daneil Dolin, MD;  Location: [facility];  Service: Endoscopy;  Laterality: N/A;    Home Meds:  Current Outpatient Prescriptions on File Prior to Visit  Medication Sig Dispense Refill  . eletriptan (RELPAX) 40 MG tablet Take 1 tablet (40 mg total) by mouth once as needed for migraine. may repeat in 2 hours if necessary  10 tablet  2  . ibuprofen (ADVIL,MOTRIN) 200 MG tablet Take 800 mg by mouth every 6 (six) hours as needed. Pain      . Ranitidine HCl (ACID REDUCER PO) Take by mouth as needed.       No current facility-administered medications on file prior to visit.    Allergies:  Allergies  Allergen Reactions  . Aspirin Other (See Comments)    G.I. Upset   . Morphine And Related Other (See Comments)    G.I. Upset  . Latex Itching and Rash    History   Social History  . Marital Status: Married    Spouse Name: N/A    Number  of Children: N/A  . Years of Education: N/A   Occupational History  . inspector    Social History Main Topics  . Smoking status: Current Every Day Smoker -- 0.50 packs/day for 30 years    Types: Cigarettes  . Smokeless tobacco: Not on file  . Alcohol Use: No  . Drug Use: No  . Sexual Activity: Not on file   Other Topics Concern  . Not on file   Social History Narrative  . No narrative on file   I did not know until today when she told me that her father is actually a patient of mine, named Chief of Staff.!! Patient states that her father actually lives with her. As well she lives with her husband and her son who is 78. Visit she has another son who is 35 years old and who is married with a child. 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 Does no exercise.  Family History  Problem Relation Age of Onset  . Cancer Mother     unsure primary, deceased at age 39  . Diabetes    .  Colon cancer Neg Hx   . Diabetes Father   . Diabetes Brother   . Diabetes Brother   . Diabetes Brother   . Diabetes Brother   . Diabetes Brother     Physical Exam: Blood pressure 126/80, pulse 76, temperature 98.2 F (36.8 C), temperature source Oral, resp. rate 18, height 5' 0.5" (1.537 m), weight 150 lb (68.04 kg)., Body mass index is 28.8 kg/(m^2). General: Well developed, well nourished,WF. Appears in no acute distress. HEENT: Normocephalic, atraumatic. Conjunctiva pink, sclera non-icteric. Pupils 2 mm constricting to 1 mm, round, regular, and equally reactive to light and accomodation. EOMI. Internal auditory canal clear. TMs with good cone of light and without pathology. Nasal mucosa pink. Nares are without discharge. No sinus tenderness. Oral mucosa pink.  Pharynx without exudate.   Neck: Supple. Trachea midline. No thyromegaly. Full ROM. No lymphadenopathy.No Carotid Bruits. Lungs: Clear to auscultation bilaterally without wheezes, rales, or rhonchi. Breathing is of normal effort and unlabored. Cardiovascular: RRR with S1 S2. No murmurs, rubs, or gallops. Distal pulses 2+ symmetrically. No carotid or abdominal bruits. Breast: Symmetrical. No masses. Nipples without discharge. Abdomen: Soft, non-tender, non-distended with normoactive bowel sounds. No hepatosplenomegaly or masses. No rebound/guarding. No CVA tenderness. No hernias.  Genitourinary:  External genitalia without lesions. Vaginal mucosa pink.No discharge present. Cervix pink and without discharge. No cervical tenderness.Normal uterus size. No adnexal mass or tenderness.   Musculoskeletal: Full range of motion and 5/5 strength throughout. Without swelling, atrophy, tenderness, crepitus, or warmth. Extremities without clubbing, cyanosis, or edema. Calves supple. Skin: Warm and moist without erythema, ecchymosis, wounds, or rash. Neuro: A+Ox3. CN II-XII grossly intact. Moves all extremities spontaneously. Full sensation  throughout. Normal gait. DTR 2+ throughout upper and lower extremities. Finger to nose intact. Psych:  Responds to questions appropriately with a normal affect.   Assessment/Plan:  48 y.o. y/o female here for CPE 1. Visit for preventive health examination  A. Screening Labs: She is fasting so we'll check labs now. TSH was just done 08/07/13 so will not repeat that now.  B. Pap: She had a Pap smear here with me on 07/30/2011. Cytology was negative. HPV cotesting was performed and not detected. Therefore she can wait 5 years today repeat Pap smear.  C. Screening Mammogram: She says that she just got a letter from the mammogram placed letting her know that  this is stable and she is going to call to schedule this herself.  D. DEXA/BMD: Can wait to perform this as she is just recently menopausal.  E. Colorectal Cancer Screening: She is only 48 years old and has no indication to start screening colonoscopies prior to age 76.  However because of some symptoms she was having she has already seen GI and had a colonoscopy with them  10/07/12.  I am able to locate it in the computer or but this was done at him and able to pull up the actual report of the findings. Patient unsure whether it showed any polyps or whether it was normal.   F. Immunizations:  Tetanus: We have no record of her having a tetanus here and she does not think she's had one in 10 years. She is agreeable to update this. Pneumococcal: Given her smoking she needs a Pneumovax 23. Patient agreeable to get this today. Zostavax: Wait until age 55 discuss   - CBC with Differential - COMPLETE METABOLIC PANEL WITH GFR - Lipid panel - Vit D  25 hydroxy (rtn osteoporosis monitoring)  2. Hot flashes, menopausal It is noted that she has -triptan that she can use as needed for migraines but she rarely uses these. Aware of risk of possible serotonin syndrome. However risk associated with HRT or even higher for her so she accepts this possible  risk and wants to proceed with the venlafaxine. As well we will be using low dose. - venlafaxine XR (EFFEXOR XR) 37.5 MG 24 hr capsule; Take 1 capsule (37.5 mg total) by mouth daily with breakfast.  Dispense: 30 capsule; Refill: 5  3. Smoker Discussed need for cessation. Offered medications to help with cessation. She defers.  4. Migraine Rarely needs -triptan. Migraines have been very minimal.  5. Need for prophylactic vaccination with combined diphtheria-tetanus-pertussis (DTP) vaccine - Tdap vaccine greater than or equal to 7yo IM  6. Need for prophylactic vaccination against Streptococcus pneumoniae (pneumococcus) - Pneumococcal conjugate vaccine 13-valent--This was supposed to be the Pneumovax 23--will discuss with staff   Signed, Karis Juba, PA, BSFM 08/15/2013 2:26 PM

## 2013-08-16 ENCOUNTER — Other Ambulatory Visit: Payer: Self-pay | Admitting: Physician Assistant

## 2013-08-16 ENCOUNTER — Telehealth: Payer: Self-pay | Admitting: Family Medicine

## 2013-08-16 ENCOUNTER — Encounter: Payer: Self-pay | Admitting: Family Medicine

## 2013-08-16 DIAGNOSIS — E782 Mixed hyperlipidemia: Secondary | ICD-10-CM | POA: Insufficient documentation

## 2013-08-16 DIAGNOSIS — Z1231 Encounter for screening mammogram for malignant neoplasm of breast: Secondary | ICD-10-CM

## 2013-08-16 DIAGNOSIS — E785 Hyperlipidemia, unspecified: Secondary | ICD-10-CM | POA: Insufficient documentation

## 2013-08-16 MED ORDER — ATORVASTATIN CALCIUM 80 MG PO TABS: 80.0000 mg | ORAL_TABLET | Freq: Every day | ORAL | Status: DC

## 2013-08-16 NOTE — Telephone Encounter (Signed)
Labs and meds ordered.  Have left message for patient to call me back.

## 2013-08-16 NOTE — Telephone Encounter (Signed)
Message copied by Olena Mater on Wed Aug 16, 2013 12:47 PM ------      Message from: Dena Billet      Created: Tue Aug 15, 2013  6:28 AM       Her cholesterol is horrible.  Must start cholesterol medication.      Send prescription for Lipitor 80 mg 1 by mouth daily #30 with one refill      Have her come in to repeat FLP and LFTs in 6 weeks.      Other labs are stable. ------

## 2013-08-18 NOTE — Telephone Encounter (Signed)
Spoke to patient about bad cholesterol reading and need for Atorvastatin.  needs to watch diet.  Understands need for repeat labs in 6 weeks

## 2013-08-25 ENCOUNTER — Telehealth: Payer: Self-pay | Admitting: *Deleted

## 2013-08-25 MED ORDER — CIPROFLOXACIN HCL 500 MG PO TABS: 500.0000 mg | ORAL_TABLET | Freq: Two times a day (BID) | ORAL | Status: DC

## 2013-08-25 NOTE — Telephone Encounter (Signed)
Received call from patient.   Reported that she is having urinary frequency and urgency x2 days, but blood is now noted to urine.   MD please advise.

## 2013-08-25 NOTE — Telephone Encounter (Signed)
Sounds like uti, start cipro 500 bid for 3 days.

## 2013-08-25 NOTE — Telephone Encounter (Signed)
Call placed to patient and patient made aware.   Prescription sent to pharmacy.  

## 2013-09-11 ENCOUNTER — Telehealth: Payer: Self-pay | Admitting: Family Medicine

## 2013-09-11 DIAGNOSIS — Z720 Tobacco use: Secondary | ICD-10-CM

## 2013-09-11 NOTE — Telephone Encounter (Signed)
Inform her that possible adverse effects can include change in mood or behavior including suicidal ideation. If she develops any of these, stop the medication and call us immediately. Consent prescription for Chantix Starting dose pack--# 1 pack Continuing dose pack # 1 pack with 3 refills.

## 2013-09-11 NOTE — Telephone Encounter (Signed)
Message copied by Olena Mater on Mon Sep 11, 2013 11:12 AM ------      Message from: [facility], Judson Roch J      Created: Mon Sep 11, 2013  9:28 AM      Contact: [phone]       She wants an Rx for Chantix -She uses [facility]      Please call ------

## 2013-09-13 MED ORDER — VARENICLINE TARTRATE 1 MG PO TABS: 1.0000 mg | ORAL_TABLET | Freq: Two times a day (BID) | ORAL | Status: DC

## 2013-09-13 MED ORDER — VARENICLINE TARTRATE 0.5 MG X 11 & 1 MG X 42 PO MISC: ORAL | Status: DC

## 2013-09-13 NOTE — Telephone Encounter (Signed)
Pt cautioned about adverse reaction per provider recommendation.

## 2013-09-25 ENCOUNTER — Ambulatory Visit (HOSPITAL_COMMUNITY)
Admission: RE | Admit: 2013-09-25 | Discharge: 2013-09-25 | Disposition: A | Discharge status: Home / Self Care | Payer: BC Managed Care – PPO | Source: Ambulatory Visit | Attending: Physician Assistant | Admitting: Physician Assistant

## 2013-09-25 DIAGNOSIS — Z1231 Encounter for screening mammogram for malignant neoplasm of breast: Secondary | ICD-10-CM

## 2013-09-28 ENCOUNTER — Other Ambulatory Visit: Payer: Self-pay | Admitting: Physician Assistant

## 2013-09-28 ENCOUNTER — Encounter: Payer: Self-pay | Admitting: *Deleted

## 2013-09-28 DIAGNOSIS — R928 Other abnormal and inconclusive findings on diagnostic imaging of breast: Secondary | ICD-10-CM

## 2013-10-18 ENCOUNTER — Ambulatory Visit (HOSPITAL_COMMUNITY)
Admission: RE | Admit: 2013-10-18 | Discharge: 2013-10-18 | Disposition: A | Discharge status: Home / Self Care | Payer: BC Managed Care – PPO | Source: Ambulatory Visit | Attending: Physician Assistant | Admitting: Physician Assistant

## 2013-10-18 DIAGNOSIS — R928 Other abnormal and inconclusive findings on diagnostic imaging of breast: Secondary | ICD-10-CM | POA: Insufficient documentation

## 2013-11-12 ENCOUNTER — Encounter (HOSPITAL_COMMUNITY): Payer: Self-pay | Admitting: Emergency Medicine

## 2013-11-12 ENCOUNTER — Emergency Department (HOSPITAL_COMMUNITY)
Admission: EM | Admit: 2013-11-12 | Discharge: 2013-11-12 | Disposition: A | Discharge status: Home / Self Care | Payer: BC Managed Care – PPO | Attending: Emergency Medicine | Admitting: Emergency Medicine

## 2013-11-12 DIAGNOSIS — Z79899 Other long term (current) drug therapy: Secondary | ICD-10-CM | POA: Insufficient documentation

## 2013-11-12 DIAGNOSIS — T63481A Toxic effect of venom of other arthropod, accidental (unintentional), initial encounter: Secondary | ICD-10-CM

## 2013-11-12 DIAGNOSIS — T6391XA Toxic effect of contact with unspecified venomous animal, accidental (unintentional), initial encounter: Secondary | ICD-10-CM | POA: Insufficient documentation

## 2013-11-12 DIAGNOSIS — T63461A Toxic effect of venom of wasps, accidental (unintentional), initial encounter: Secondary | ICD-10-CM | POA: Insufficient documentation

## 2013-11-12 DIAGNOSIS — Z86711 Personal history of pulmonary embolism: Secondary | ICD-10-CM | POA: Insufficient documentation

## 2013-11-12 DIAGNOSIS — Z8639 Personal history of other endocrine, nutritional and metabolic disease: Secondary | ICD-10-CM | POA: Insufficient documentation

## 2013-11-12 DIAGNOSIS — Z862 Personal history of diseases of the blood and blood-forming organs and certain disorders involving the immune mechanism: Secondary | ICD-10-CM | POA: Insufficient documentation

## 2013-11-12 DIAGNOSIS — Y939 Activity, unspecified: Secondary | ICD-10-CM | POA: Insufficient documentation

## 2013-11-12 DIAGNOSIS — Z8679 Personal history of other diseases of the circulatory system: Secondary | ICD-10-CM | POA: Insufficient documentation

## 2013-11-12 DIAGNOSIS — Z8719 Personal history of other diseases of the digestive system: Secondary | ICD-10-CM | POA: Insufficient documentation

## 2013-11-12 DIAGNOSIS — Z8673 Personal history of transient ischemic attack (TIA), and cerebral infarction without residual deficits: Secondary | ICD-10-CM | POA: Insufficient documentation

## 2013-11-12 DIAGNOSIS — Y929 Unspecified place or not applicable: Secondary | ICD-10-CM | POA: Insufficient documentation

## 2013-11-12 DIAGNOSIS — IMO0002 Reserved for concepts with insufficient information to code with codable children: Secondary | ICD-10-CM | POA: Insufficient documentation

## 2013-11-12 DIAGNOSIS — F17200 Nicotine dependence, unspecified, uncomplicated: Secondary | ICD-10-CM | POA: Insufficient documentation

## 2013-11-12 DIAGNOSIS — Z91040 Latex allergy status: Secondary | ICD-10-CM | POA: Insufficient documentation

## 2013-11-12 DIAGNOSIS — Z792 Long term (current) use of antibiotics: Secondary | ICD-10-CM | POA: Insufficient documentation

## 2013-11-12 MED ORDER — DIPHENHYDRAMINE HCL 25 MG PO TABS: 25.0000 mg | ORAL_TABLET | Freq: Four times a day (QID) | ORAL | Status: DC

## 2013-11-12 MED ORDER — DIPHENHYDRAMINE HCL 25 MG PO CAPS
50.0000 mg | ORAL_CAPSULE | Freq: Once | ORAL | Status: AC
  Administered 2013-11-12: 50 mg via ORAL
  Filled 2013-11-12: qty 2

## 2013-11-12 MED ORDER — PREDNISONE 10 MG PO TABS: 20.0000 mg | ORAL_TABLET | Freq: Every day | ORAL | Status: DC

## 2013-11-12 MED ORDER — FAMOTIDINE 20 MG PO TABS: 20.0000 mg | ORAL_TABLET | Freq: Two times a day (BID) | ORAL | Status: DC

## 2013-11-12 MED ORDER — HYDROCODONE-ACETAMINOPHEN 5-325 MG PO TABS
1.0000 | ORAL_TABLET | Freq: Once | ORAL | Status: AC
Start: 2013-11-12 — End: 2013-11-12
  Administered 2013-11-12: 1 via ORAL
  Filled 2013-11-12: qty 1

## 2013-11-12 MED ORDER — CEPHALEXIN 500 MG PO CAPS: 500.0000 mg | ORAL_CAPSULE | Freq: Three times a day (TID) | ORAL | Status: DC

## 2013-11-12 MED ORDER — HYDROCODONE-ACETAMINOPHEN 5-325 MG PO TABS: 1.0000 | ORAL_TABLET | ORAL | Status: DC | PRN

## 2013-11-12 MED ORDER — FAMOTIDINE 20 MG PO TABS
20.0000 mg | ORAL_TABLET | Freq: Once | ORAL | Status: AC
  Administered 2013-11-12: 20 mg via ORAL
  Filled 2013-11-12: qty 1

## 2013-11-12 NOTE — ED Notes (Signed)
One gold colored ring removed from pt's right index finger and given to pt,

## 2013-11-12 NOTE — ED Notes (Signed)
Pt states that she was working out in her yard and felt something bite her, c/o redness, swelling and pain to right hand, cms intact distal, pain is increased with touch, movement of right hand,

## 2013-11-12 NOTE — ED Provider Notes (Signed)
CSN: [id number]     Arrival date & time 11/12/13  1041 History  This chart was scribed for non-physician practitioner, Debroah Baller, FNP,working with Nat Christen, MD, by Marlowe Kays, ED Scribe.  This patient was seen in room APFT23/APFT23 and the patient's care was started at 11:36 AM.  Chief Complaint  Patient presents with  . Insect Bite   The history is provided by the patient. No language interpreter was used.   HPI Comments:  Jessica Shaw is a 48 y.o. female who presents to the Emergency Department complaining of an insect bite that she received approximately 30-45 minutes ago. She states she was working in her yard and reached down to pick up a pile of leaves when she suddenly felt a stinging sensation on the back of her right hand. She reports associated pain, redness and swelling of the area. Pt states that touching the area makes the pain worse. She denies taking anything for her symptoms PTA. She denies fever, nausea, vomiting, of HA.   Past Medical History  Diagnosis Date  . Embolism - blood clot     arm  . Stroke     TIA  . GERD (gastroesophageal reflux disease)   . Complication of anesthesia   . PONV (postoperative nausea and vomiting)   . Migraine   . Smoker   . HLD (hyperlipidemia)    Past Surgical History  Procedure Laterality Date  . Shoulder arthroscopy      left  . Tubal ligation    . Cholecystectomy    . Spinal cord stimulator implant    . Patent foramen ovale closure    . Esophagogastroduodenoscopy  04/20/2003    [id number] changes of reflux esophagitis/Limited gastroesophageal junction, otherwise normal  . Colonoscopy  08/04/2005    [id number] external hemorrhoids and anal papilla, otherwise normal  . Colonoscopy N/A 10/07/2012    Procedure: COLONOSCOPY;  Surgeon: Daneil Dolin, MD;  Location: [facility];  Service: Endoscopy;  Laterality: N/A;  10:30  . Esophagogastroduodenoscopy N/A 10/07/2012    Procedure: ESOPHAGOGASTRODUODENOSCOPY (EGD);   Surgeon: Daneil Dolin, MD;  Location: [facility];  Service: Endoscopy;  Laterality: N/A;   Family History  Problem Relation Age of Onset  . Cancer Mother     unsure primary, deceased at age 75  . Diabetes    . Colon cancer Neg Hx   . Diabetes Father   . Diabetes Brother   . Diabetes Brother   . Diabetes Brother   . Diabetes Brother   . Diabetes Brother    History  Substance Use Topics  . Smoking status: Current Every Day Smoker -- 0.50 packs/day for 30 years    Types: Cigarettes  . Smokeless tobacco: Not on file  . Alcohol Use: No   OB History   Grav Para Term Preterm Abortions TAB SAB Ect Mult Living                 Review of Systems  Constitutional: Negative for fever.  Gastrointestinal: Negative for nausea and vomiting.  Skin: Positive for color change (redness to the right hand secondary to insect bite).  Neurological: Negative for headaches.  All other systems reviewed and are negative.   Allergies  Aspirin; Demerol; Morphine and related; and Latex  Home Medications   Prior to Admission medications   Medication Sig Start Date End Date Taking? Authorizing Provider  eletriptan (RELPAX) 40 MG tablet Take 1 tablet (40 mg total) by mouth once as needed for  migraine. may repeat in 2 hours if necessary 08/11/13  Yes Orlena Sheldon, PA-C  venlafaxine XR (EFFEXOR XR) 37.5 MG 24 hr capsule Take 1 capsule (37.5 mg total) by mouth daily with breakfast. 08/14/13  Yes Orlena Sheldon, PA-C  cephALEXin (KEFLEX) 500 MG capsule Take 1 capsule (500 mg total) by mouth 3 (three) times daily. 11/12/13   Quina Wilbourne Bunnie Pion, NP  diphenhydrAMINE (BENADRYL) 25 MG tablet Take 1 tablet (25 mg total) by mouth every 6 (six) hours. 11/12/13   Katisha Shimizu Bunnie Pion, NP  famotidine (PEPCID) 20 MG tablet Take 1 tablet (20 mg total) by mouth 2 (two) times daily. 11/12/13   Rayford Williamsen Bunnie Pion, NP  HYDROcodone-acetaminophen (NORCO/VICODIN) 5-325 MG per tablet Take 1 tablet by mouth every 4 (four) hours as needed. 11/12/13    Almus Woodham Bunnie Pion, NP  predniSONE (DELTASONE) 10 MG tablet Take 2 tablets (20 mg total) by mouth daily. 11/12/13   Teofila Bowery Bunnie Pion, NP   Triage Vitals: BP 128/97  Pulse 100  Temp(Src) 97.6 F (36.4 C) (Oral)  Resp 22  Ht 5\' 2"  (1.575 m)  Wt 152 lb (68.947 kg)  BMI 27.79 kg/m2  SpO2 99% Physical Exam  Nursing note and vitals reviewed. Constitutional: She is oriented to person, place, and time. She appears well-developed and well-nourished.  Eyes: EOM are normal.  Neck: Neck supple.  Cardiovascular:  Normal radial pulse. Adequate circulation.  Pulmonary/Chest: Effort normal.  Abdominal: Soft. There is no tenderness.  Musculoskeletal: Normal range of motion. She exhibits edema and tenderness.  Neurological: She is alert and oriented to person, place, and time. No cranial nerve deficit.  Good touch sensation of right hand.  Skin: Skin is warm and dry. There is erythema.  Swelling and erythema of dorsum of right hand. Very tender on exam.     ED Course  Procedures (including critical care time) DIAGNOSTIC STUDIES: Oxygen Saturation is 99% on RA, normal by my interpretation.   COORDINATION OF CARE: 11:38 AM- Will order Vicodin, Benadryl, and Pepcid. Pt verbalizes understanding and agrees to plan.  Medications  famotidine (PEPCID) tablet 20 mg (20 mg Oral Given 11/12/13 1142)  diphenhydrAMINE (BENADRYL) capsule 50 mg (50 mg Oral Given 11/12/13 1142)  HYDROcodone-acetaminophen (NORCO/VICODIN) 5-325 MG per tablet 1 tablet (1 tablet Oral Given 11/12/13 1142)    MDM  48 y.o. female with swelling, erythema and tenderness to the dorsum of the right hand s/p working in the yard and having something sting her. Improved with ice, elevation, Pepcid, Benadryl and Hydrocodone. Stable for discharge with out difficulty swallowing, shortness of breath or wheezing. Discussed with the patient and all questioned fully answered. She will return if any problems arise.    Medication List    TAKE these  medications       cephALEXin 500 MG capsule  Commonly known as:  KEFLEX  Take 1 capsule (500 mg total) by mouth 3 (three) times daily.     diphenhydrAMINE 25 MG tablet  Commonly known as:  BENADRYL  Take 1 tablet (25 mg total) by mouth every 6 (six) hours.     famotidine 20 MG tablet  Commonly known as:  PEPCID  Take 1 tablet (20 mg total) by mouth 2 (two) times daily.     HYDROcodone-acetaminophen 5-325 MG per tablet  Commonly known as:  NORCO/VICODIN  Take 1 tablet by mouth every 4 (four) hours as needed.     predniSONE 10 MG tablet  Commonly known as:  DELTASONE  Take  2 tablets (20 mg total) by mouth daily.      ASK your doctor about these medications       eletriptan 40 MG tablet  Commonly known as:  RELPAX  Take 1 tablet (40 mg total) by mouth once as needed for migraine. may repeat in 2 hours if necessary     venlafaxine XR 37.5 MG 24 hr capsule  Commonly known as:  EFFEXOR XR  Take 1 capsule (37.5 mg total) by mouth daily with breakfast.        Final diagnoses:  Allergic reaction to insect sting   I personally performed the services described in this documentation, which was scribed in my presence. The recorded information has been reviewed and is accurate.    Ashley Mur[place], [place] 11/12/13 2211

## 2013-11-12 NOTE — Discharge Instructions (Signed)
Apply cool compresses to the area. Take the medication as directed. Do not take the narcotic if driving as it will make you sleepy. Return for worsening symptoms.

## 2013-11-13 ENCOUNTER — Ambulatory Visit (INDEPENDENT_AMBULATORY_CARE_PROVIDER_SITE_OTHER): Payer: BC Managed Care – PPO | Admitting: Family Medicine

## 2013-11-13 ENCOUNTER — Encounter: Payer: Self-pay | Admitting: Family Medicine

## 2013-11-13 VITALS — BP 130/78 | HR 76 | Temp 98.3°F | Resp 14 | Ht 62.0 in | Wt 154.0 lb

## 2013-11-13 DIAGNOSIS — IMO0001 Reserved for inherently not codable concepts without codable children: Secondary | ICD-10-CM

## 2013-11-13 DIAGNOSIS — W57XXXA Bitten or stung by nonvenomous insect and other nonvenomous arthropods, initial encounter: Secondary | ICD-10-CM

## 2013-11-13 DIAGNOSIS — M7989 Other specified soft tissue disorders: Secondary | ICD-10-CM

## 2013-11-13 NOTE — Patient Instructions (Addendum)
Prednisone increase to 40mg  ( 4 tablets daily) for next 4 days  Continue pepcid- take twice a day for 1 week Continue antibiotics Ice and elevate hand  Work note for today, may return tomorrow  Start Chantix after arm has healed  F/U Wed

## 2013-11-13 NOTE — Progress Notes (Signed)
Patient ID: Jessica Shaw, female   DOB: Sep 28, 1965, 48 y.o.   MRN: [id number]    Subjective:    Patient ID: Jessica Shaw, female    DOB: Jan 29, 1966, 48 y.o.   MRN: [id number]  Patient presents for Hospital F/U for insect bite  patient here with swelling of her right hand. She was out moving some firewood yesterday which he felt something sting her she's been has significant pain into her wrist felt like it was crushed or broken. She was seen in the ER about an hour after the incident she was treated for infection as well as possible allergic reaction. She did not see anything particular bite her. She was given Pepcid Benadryl prednisone as well as antibiotics. She states the redness is down today however she still has a lot of swelling though she did try to work today with her hands. The pain is mostly at the wrist she's also noticed some color changes in the Center of her hand No fever, no chills, no pain in elbow or shoulder     Review Of Systems:  GEN- denies fatigue, fever, weight loss,weakness, recent illness CVS- denies chest pain, palpitations RESP- denies SOB, cough, wheeze ABD- denies N/V, change in stools, abd pain MSK- + joint pain, muscle aches, injury Neuro- denies headache, dizziness, syncope, seizure activity       Objective:    BP 130/78  Pulse 76  Temp(Src) 98.3 F (36.8 C) (Oral)  Resp 14  Ht 5\' 2"  (1.575 m)  Wt 154 lb (69.854 kg)  BMI 28.16 kg/m2 GEN- NAD, alert and oriented x3 Skin- erythema covering dorsum surface of hand extending to mid forearm palm spared, small areas of ecchymosis, small puncture wound on dorsal surface, MSK- TTP along dorsum of hands, wrist, Decreased ROM at Wrist, unable to make first, swelling from MIP to mid forearm Pulses- Radial, DP- 2+        Assessment & Plan:      Problem List Items Addressed This Visit   None    Visit Diagnoses   Hand swelling    -  Primary    s/p to reaction from bite, ICE, Elevate    Infected insect bite        Complete antibiotics, allergic reaction vs infection, increase prednisone to 40mg  once a day f/u in 48 hours, continue benadryl/pecid       Note: This dictation was prepared with Dragon dictation along with smaller phrase technology. Any transcriptional errors that result from this process are unintentional.

## 2013-11-15 ENCOUNTER — Encounter: Payer: Self-pay | Admitting: Family Medicine

## 2013-11-15 ENCOUNTER — Ambulatory Visit (INDEPENDENT_AMBULATORY_CARE_PROVIDER_SITE_OTHER): Payer: BC Managed Care – PPO | Admitting: Family Medicine

## 2013-11-15 VITALS — BP 116/64 | HR 76 | Temp 98.2°F | Resp 14 | Ht 62.0 in | Wt 154.0 lb

## 2013-11-15 DIAGNOSIS — M7989 Other specified soft tissue disorders: Secondary | ICD-10-CM

## 2013-11-15 DIAGNOSIS — IMO0001 Reserved for inherently not codable concepts without codable children: Secondary | ICD-10-CM

## 2013-11-15 DIAGNOSIS — W57XXXA Bitten or stung by nonvenomous insect and other nonvenomous arthropods, initial encounter: Secondary | ICD-10-CM

## 2013-11-15 MED ORDER — VARENICLINE TARTRATE 0.5 MG X 11 & 1 MG X 42 PO MISC: ORAL | Status: DC

## 2013-11-15 NOTE — Progress Notes (Signed)
Patient ID: Jessica Shaw, female   DOB: 02/25/1966, 48 y.o.   MRN: [id number]   Subjective:    Patient ID: Jessica Shaw, female    DOB: [birth year], 48 y.o.   MRN: [id number]  Patient presents for F/U  patient here for followup on recent insect bite in hand swelling. She is covered for infection as it was unclear what she was bitten by as well as for allergic reaction. Her swelling in her knuckles and hands have resolved. She still has some ecchymotic spots on the dorsum of her hand. The pain has also decreased significantly she has not required any pain medication. She hasn't taken antibiotics prednisone Pepcid as prescribed. She no longer needs any Benadryl.    Review Of Systems:  GEN- denies fatigue, fever, weight loss,weakness, recent illness HEENT- denies eye drainage, change in vision, nasal discharge, CVS- denies chest pain, palpitations RESP- denies SOB, cough, wheeze ABD- denies N/V, change in stools, abd pain MSK-+ joint pain, muscle aches, injury        Objective:    BP 116/64  Pulse 76  Temp(Src) 98.2 F (36.8 C) (Oral)  Resp 14  Ht 5\' 2"  (1.575 m)  Wt 154 lb (69.854 kg)  BMI 28.16 kg/m2  GEN- NAD, alert and oriented x3 Skin- no swelling in fingers or dorsum of hand or wrist,   small areas of ecchymosis on dorsum, small puncture wound on dorsal surface, MSK- mild TTP along dorsum of hands in area of ecchymosis, wrist, Good ROM wrist, able to make fist, normal grasp Pulses- Radial 2+      Assessment & Plan:      Problem List Items Addressed This Visit   None    Visit Diagnoses   Infected insect bite    -  Primary    Much improved today. Her swelling has resolved. I'll have her complete antibiotics and the last couple days of prednisone and Pepcid. No further intervention needed       Note: This dictation was prepared with Dragon dictation along with smaller phrase technology. Any transcriptional errors that result from this process are  unintentional.

## 2013-11-15 NOTE — Patient Instructions (Addendum)
Complete antibiotics FInish prednisone tomorrow  TAke Pepcid for 2 more days then stop  Okay to start Chantix  F/U as needed

## 2013-11-20 NOTE — ED Provider Notes (Signed)
Medical screening examination/treatment/procedure(s) were performed by non-physician practitioner and as supervising physician I was immediately available for consultation/collaboration.   EKG Interpretation None       Nat Christen, MD 11/20/13 [phone]

## 2013-12-01 ENCOUNTER — Telehealth: Payer: Self-pay | Admitting: Family Medicine

## 2013-12-01 NOTE — Telephone Encounter (Signed)
Found appropriate form for medication requested.  Will fax to insurance to try and get quantity limitation adjustment.

## 2013-12-01 NOTE — Telephone Encounter (Addendum)
Pt states the pharmacy has told her that her insurance will only cover a max of 4 tablets/30 days. If patient used more she is having to pay out of pocket. Need to see if if we can do quanity over ride with her insurance.  States uses about 12 of these a month.  They are her "magic" headache medicine.

## 2013-12-01 NOTE — Telephone Encounter (Signed)
Message copied by Olena Mater on Fri Dec 01, 2013  2:03 PM ------      Message from: Devoria Glassing      Created: Fri Dec 01, 2013 12:38 PM       [phone]      Patient is calling to say she is having an issue with her insurance company and her medication for her migranes  ------

## 2013-12-05 NOTE — Telephone Encounter (Signed)
Received quanity exception for patient's Relpax 40 mg tabalets.  Approved 36 tablets per 90 days (12 per month).  Pt called and made aware.  [facility] faxed to patient;s pharmacy.  Refernce # on claim is [id number]  Auth from 12/04/2013 - 06/14/2038.

## 2013-12-07 ENCOUNTER — Telehealth: Payer: Self-pay | Admitting: Family Medicine

## 2013-12-07 DIAGNOSIS — G43711 Chronic migraine without aura, intractable, with status migrainosus: Secondary | ICD-10-CM

## 2013-12-07 MED ORDER — ELETRIPTAN HYDROBROMIDE 40 MG PO TABS: 40.0000 mg | ORAL_TABLET | Freq: Once | ORAL | Status: DC | PRN

## 2013-12-07 NOTE — Telephone Encounter (Signed)
Medication refilled per protocol. 

## 2014-01-17 ENCOUNTER — Ambulatory Visit (INDEPENDENT_AMBULATORY_CARE_PROVIDER_SITE_OTHER): Payer: BC Managed Care – PPO | Admitting: Physician Assistant

## 2014-01-17 ENCOUNTER — Encounter: Payer: Self-pay | Admitting: Physician Assistant

## 2014-01-17 VITALS — BP 128/74 | HR 72 | Temp 96.9°F | Resp 16 | Ht 62.0 in | Wt 158.0 lb

## 2014-01-17 DIAGNOSIS — IMO0002 Reserved for concepts with insufficient information to code with codable children: Secondary | ICD-10-CM

## 2014-01-17 DIAGNOSIS — M6740 Ganglion, unspecified site: Secondary | ICD-10-CM

## 2014-01-17 MED ORDER — MELOXICAM 7.5 MG PO TABS: 7.5000 mg | ORAL_TABLET | Freq: Every day | ORAL | Status: DC

## 2014-01-17 NOTE — Progress Notes (Signed)
Patient ID: Jessica Shaw MRN: [id number], DOB: [birth year], 48 y.o. Date of Encounter: 01/17/2014, 4:16 PM    Chief Complaint:  Chief Complaint  Patient presents with  . Knot on left hand x 1 week     HPI: 48 y.o. year old white female says that she just noticed this area 7 days ago. At that time just noticed this "knot" on her left hand.  Since then, she has also felt some pain/tingling sensation to her left thumb. Noticed no other symptoms. No other pain or paresthesias. She is left-handed. She states she had no trauma or injury to the area. She states that she has been doing no increased repetitive motion with the left wrist/left hand in the past several weeks.     Home Meds:   Outpatient Prescriptions Prior to Visit  Medication Sig Dispense Refill  . eletriptan (RELPAX) 40 MG tablet Take 1 tablet (40 mg total) by mouth once as needed for migraine. may repeat in 2 hours if necessary  12 tablet  2  . Omega-3 Fatty Acids (FISH OIL) 1000 MG CAPS Take 1,000 mg by mouth daily.      Marland Kitchen venlafaxine XR (EFFEXOR XR) 37.5 MG 24 hr capsule Take 1 capsule (37.5 mg total) by mouth daily with breakfast.  30 capsule  5  . cephALEXin (KEFLEX) 500 MG capsule Take 1 capsule (500 mg total) by mouth 3 (three) times daily.  15 capsule  0  . diphenhydrAMINE (BENADRYL) 25 MG tablet Take 1 tablet (25 mg total) by mouth every 6 (six) hours.  20 tablet  0  . famotidine (PEPCID) 20 MG tablet Take 1 tablet (20 mg total) by mouth 2 (two) times daily.  30 tablet  0  . HYDROcodone-acetaminophen (NORCO/VICODIN) 5-325 MG per tablet Take 1 tablet by mouth every 4 (four) hours as needed.  15 tablet  0  . predniSONE (DELTASONE) 10 MG tablet Take 2 tablets (20 mg total) by mouth daily.  16 tablet  0  . varenicline (CHANTIX STARTING MONTH PAK) 0.5 MG X 11 & 1 MG X 42 tablet Take one 0.5 mg tablet by mouth once daily for 3 days, then increase to one 0.5 mg tablet twice daily for 4 days, then increase to one 1 mg  tablet twice daily.  53 tablet  0   No facility-administered medications prior to visit.    Allergies:  Allergies  Allergen Reactions  . Aspirin Other (See Comments)    G.I. Upset   . Demerol [Meperidine]     Gi upset,   . Morphine And Related Other (See Comments)    G.I. Upset  . Latex Itching and Rash      Review of Systems: See HPI for pertinent ROS. All other ROS negative.    Physical Exam: Blood pressure 128/74, pulse 72, temperature 96.9 F (36.1 C), temperature source Oral, resp. rate 16, height 5\' 2"  (1.575 m), weight 158 lb (71.668 kg)., Body mass index is 28.89 kg/(m^2). General: WNWD WF.  Appears in no acute distress. Neck: Supple. No thyromegaly. No lymphadenopathy. Lungs: Clear bilaterally to auscultation without wheezes, rales, or rhonchi. Breathing is unlabored. Heart: Regular rhythm. No murmurs, rubs, or gallops. Msk:  Strength and tone normal for age. Left Wrist: Dorsal Surface, Radial Aspect--Just Proximal to base of the thumb: There is a mass, which is soft and fluctuant, approximately 1 cm in size. There is no erythema or warmth.  FROM intact of all fingers and wrist joint. Neurovascularly intact.  Extremities/Skin: Warm and dry. Neuro: Alert and oriented X 3. Moves all extremities spontaneously. Gait is normal. CNII-XII grossly in tact. Psych:  Responds to questions appropriately with a normal affect.     ASSESSMENT AND PLAN:  48 y.o. year old female with  1. Cyst of hand She states that she has been taking no medications to try to treat this. Will start her on an anti-inflammatory. She is to take this with food. Also told her to avoid repetitive motion using the left hand. May apply ice. Will schedule followup with a hand specialist in the event that this is not resolving on its own over the next week.F/U if Size or pain at the site worsens in the interim. - meloxicam (MOBIC) 7.5 MG tablet; Take 1 tablet (7.5 mg total) by mouth daily.  Dispense: 30  tablet; Refill: 0 - Ambulatory referral to Orthopedic Surgery   Signed, Olean Ree [place], [place], [facility] 01/17/2014 4:16 PM

## 2014-03-07 ENCOUNTER — Encounter: Payer: Self-pay | Admitting: Family Medicine

## 2014-03-07 ENCOUNTER — Other Ambulatory Visit: Payer: Self-pay | Admitting: Physician Assistant

## 2014-03-07 NOTE — Telephone Encounter (Signed)
Medication refill for one time only.  Patient needs to be seen.  Letter sent for patient to call and schedule 

## 2014-04-02 ENCOUNTER — Ambulatory Visit (INDEPENDENT_AMBULATORY_CARE_PROVIDER_SITE_OTHER): Payer: BC Managed Care – PPO | Admitting: Physician Assistant

## 2014-04-02 ENCOUNTER — Encounter: Payer: Self-pay | Admitting: Physician Assistant

## 2014-04-02 VITALS — BP 114/76 | HR 88 | Temp 98.3°F | Resp 18 | Wt 160.0 lb

## 2014-04-02 DIAGNOSIS — Z720 Tobacco use: Secondary | ICD-10-CM

## 2014-04-02 DIAGNOSIS — G43711 Chronic migraine without aura, intractable, with status migrainosus: Secondary | ICD-10-CM

## 2014-04-02 DIAGNOSIS — G43009 Migraine without aura, not intractable, without status migrainosus: Secondary | ICD-10-CM

## 2014-04-02 DIAGNOSIS — F17200 Nicotine dependence, unspecified, uncomplicated: Secondary | ICD-10-CM

## 2014-04-02 DIAGNOSIS — E785 Hyperlipidemia, unspecified: Secondary | ICD-10-CM

## 2014-04-02 DIAGNOSIS — N951 Menopausal and female climacteric states: Secondary | ICD-10-CM

## 2014-04-02 MED ORDER — VENLAFAXINE HCL ER 37.5 MG PO CP24: ORAL_CAPSULE | ORAL | Status: DC

## 2014-04-02 MED ORDER — ATORVASTATIN CALCIUM 80 MG PO TABS: 80.0000 mg | ORAL_TABLET | Freq: Every day | ORAL | Status: DC

## 2014-04-02 MED ORDER — ELETRIPTAN HYDROBROMIDE 40 MG PO TABS: 40.0000 mg | ORAL_TABLET | Freq: Once | ORAL | Status: DC | PRN

## 2014-04-03 NOTE — Progress Notes (Signed)
Patient ID: Jessica Shaw MRN: [id number], DOB: 1965-08-03, 48 y.o. Date of Encounter: 04/03/2014,   Chief Complaint: Routine f/u OV.  HPI: 48 y.o. y/o white female  here for routine f/u OV.   THE FOLLOWING IS COPIED FROM HER CPE NOTE WITH ME 08/14/2013:   She just had an office visit with me 08/07/13 secondary to recent onset of hot flashes. The following is copied from that office visit note:  " This just started all of a sudden in the past 3 weeks. But says that she is having severe hot flashes. Says at night she is waking up throughout the night secondary to her clothes and bedding making her so hot. Says multiple nights she ends up having to take her clothes off during the night because she is in a sweat. She keeps a cup of ice water by her bed to drink through the night. Has even had to get up and take a shower in the middle of the night. Is getting decreased sleep secondary to this. She also has problems with this during the day all day long.  Reports that she had a uterine ablation around June 2012. This was performed secondary to heavy bleeding. Has had no menstrual bleeding since the ablation. Therefore has no idea whether she's gone through menopause regarding bleeding cycles.  She has noticed no significant weight change, no palpitation, no significant change in hair or skin.  Has had no fever and no cough."  At that visit we discussed:  (Below is copied from that note dated 08/07/13): Discussed with her that we will check labs--FSH, LH, TSH.  If TSH is normal but her [place] and LH do indicate menopause, then we could discuss options for treating hot flashes.  Today I went ahead and discussed with her the options so that we can begin that conversation.  Option 1: HRT  Discussed that risks include:  A. risk of blood clot/CAD/CVD  ---Smoker; discussed that this would increase this risk and that she really would need smoking cessation for [place] to further discuss this option  ---History  states h/o 'blood clot arm" and h/o TIA  However, she reports that this all happened when she had a tear in her left bicep tendon. States that this injury occurred but she continued to work and therefore developed a lot of swelling in the arm and with this compression this is what caused the blood clot in the arm. States that she has had to have multiple surgeries to that left arm since then.  Also, as it turns out, she ended up having a PF O. which has been closed.  This was the cause of the TIA. Underlying cause has been fixed.  --She has no other personal history of CAD or PVD.  --No family history of premature CAD or PVD  B. discussed that hormone replacement therapy does not actually cause breast or uterine cancer but can speed up their growth if cancer Present.  She reports that she does have a history of cervical dysplasia and had what sounds like a LEEP procedure done to this. However had no Cervical Cancer.  No h/o uterine cancer or breast cancer or ovarian cancer.  She has no family history of any of these GYN cancers  She is going to schedule a complete physical exam so that we can perform breast exam pelvic exam and make sure her mammogram is up-to-date  Option 2: Venlafaxine or other SSRI.  She is to schedule for  complete physical exam in the next one to 2 weeks and we will further discuss treatment of hot flashes at that visit if the labs are consistent with hot flashes.    she now presents for followup. She says that she has thought things over and is concerned about starting hormone replacement therapy given her risk factor profile. Does want to start the venlafaxine. Otherwise, she has no new complaints today. Just here to get her physical/preventive care caught up.  AT THAT OV 08/14/2013 THE ASSESSMENT/PLAN WAS:  2. Hot flashes, menopausal It is noted that she has -triptan that she can use as needed for migraines but she rarely uses these. Aware of risk of possible serotonin  syndrome. However risk associated with HRT or even higher for her so she accepts this possible risk and wants to proceed with the venlafaxine. As well we will be using low dose. - venlafaxine XR (EFFEXOR XR) 37.5 MG 24 hr capsule; Take 1 capsule (37.5 mg total) by mouth daily with breakfast.  Dispense: 30 capsule; Refill: 5  3. Smoker Discussed need for cessation. Offered medications to help with cessation. She defers.  4. Migraine Rarely needs -triptan. Migraines have been very minimal.    TODAY--04/02/2014: Today she reports that the Effexor is working great. She says that after being on that medicine for just a couple weeks her hot flashes stopped. She is having absolutely no hot flashes anymore. She is having no adverse effects from the medicine.  Today I also reviewed that 08/14/13 labs included an LDL of 207 and I had stated to start Lipitor 80 mg. However, today patient states that she does not know anything about that and never started the Lipitor.  States that she still is not having migraines frequently but says that when she does the Relpax works very well for her.   Says that "the Effexor and Relpax are Gold to Her !! "  Past Medical History  Diagnosis Date  . Embolism - blood clot     arm  . Stroke     TIA  . GERD (gastroesophageal reflux disease)   . Complication of anesthesia   . PONV (postoperative nausea and vomiting)   . Migraine   . Smoker   . HLD (hyperlipidemia)      Past Surgical History  Procedure Laterality Date  . Shoulder arthroscopy      left  . Tubal ligation    . Cholecystectomy    . Spinal cord stimulator implant    . Patent foramen ovale closure    . Esophagogastroduodenoscopy  04/20/2003    [facility]:NOBS changes of reflux esophagitis/Limited gastroesophageal junction, otherwise normal  . Colonoscopy  08/04/2005    [facility]:EZMOQ external hemorrhoids and anal papilla, otherwise normal  . Colonoscopy N/A 10/07/2012    Procedure: COLONOSCOPY;  Surgeon:  Daneil Dolin, MD;  Location: [facility];  Service: Endoscopy;  Laterality: N/A;  10:30  . Esophagogastroduodenoscopy N/A 10/07/2012    Procedure: ESOPHAGOGASTRODUODENOSCOPY (EGD);  Surgeon: Daneil Dolin, MD;  Location: [facility];  Service: Endoscopy;  Laterality: N/A;    Home Meds:  Outpatient Prescriptions Prior to Visit  Medication Sig Dispense Refill  . Omega-3 Fatty Acids (FISH OIL) 1000 MG CAPS Take 1,000 mg by mouth daily.      Marland Kitchen eletriptan (RELPAX) 40 MG tablet Take 1 tablet (40 mg total) by mouth once as needed for migraine. may repeat in 2 hours if necessary  12 tablet  2  . venlafaxine XR (  EFFEXOR-XR) 37.5 MG 24 hr capsule TAKE ONE CAPSULE BY MOUTH ONCE DAILY WITH BREAKFAST  30 capsule  0  . meloxicam (MOBIC) 7.5 MG tablet Take 1 tablet (7.5 mg total) by mouth daily.  30 tablet  0   No facility-administered medications prior to visit.     Allergies:  Allergies  Allergen Reactions  . Aspirin Other (See Comments)    G.I. Upset   . Demerol [Meperidine]     Gi upset,   . Morphine And Related Other (See Comments)    G.I. Upset  . Latex Itching and Rash    History   Social History  . Marital Status: Married    Spouse Name: N/A    Number of Children: N/A  . Years of Education: N/A   Occupational History  . inspector    Social History Main Topics  . Smoking status: Current Every Day Smoker -- 0.50 packs/day for 30 years    Types: Cigarettes  . Smokeless tobacco: Never Used  . Alcohol Use: No  . Drug Use: No  . Sexual Activity: Yes   Other Topics Concern  . Not on file   Social History Narrative   Today patient informed me that her father, who is actually a patient of mine-- named  Chief of Staff-- I had no idea of this until today!!   She says that he actually lives with them now.   She also lives with her husband and her 34 year old son.   Says she also has one other son who is 44 and married with a child.      She works as a Merchandiser, retail at [employer].   Does no exercise.   Has smoked about 1/2 ppd since age 17 years old-- now this has been for 30 years.(as of 2015)   I did not know until today when she told me that her father is actually a patient of mine, named Chief of Staff.!! Patient states that her father actually lives with her. As well she lives with her husband and her son who is 55. Visit she has another son who is 19 years old and who is married with a child. 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 Does no exercise.  Family History  Problem Relation Age of Onset  . Cancer Mother     unsure primary, deceased at age 44  . Diabetes    . Colon cancer Neg Hx   . Diabetes Father   . Diabetes Brother   . Diabetes Brother   . Diabetes Brother   . Diabetes Brother   . Diabetes Brother     Physical Exam: Blood pressure 114/76, pulse 88, temperature 98.3 F (36.8 C), temperature source Oral, resp. rate 18, weight 160 lb (72.576 kg)., Body mass index is 29.26 kg/(m^2). General: Well developed, well nourished,WF. Appears in no acute distress. Neck: Supple. Trachea midline. No thyromegaly. Full ROM. No lymphadenopathy.No Carotid Bruits. Lungs: Clear to auscultation bilaterally without wheezes, rales, or rhonchi. Breathing is of normal effort and unlabored. Cardiovascular: RRR with S1 S2. No murmurs, rubs, or gallops. Distal pulses 2+ symmetrically. No carotid or abdominal bruits. Abdomen: Soft, non-tender, non-distended with normoactive bowel sounds. No hepatosplenomegaly or masses. No rebound/guarding. No CVA tenderness. No hernias.  Musculoskeletal: Full range of motion and 5/5 strength throughout.  Skin: Warm and moist. Neuro: A+Ox3. CN II-XII grossly intact. Moves all extremities spontaneously.  Psych:  Responds to questions appropriately with a  normal affect.   Assessment/Plan:  49 y.o. y/o female here for   1. HLD (hyperlipidemia) Today we discussed her lipid  panel and discussed the fact that she needs to start Lipitor.  She is agreeable to start medication and voices understanding to return for fasting labs 6 weeks after starting medication. - atorvastatin (LIPITOR) 80 MG tablet; Take 1 tablet (80 mg total) by mouth daily.  Dispense: 30 tablet; Refill: 1 - Hepatic function panel; Future - Lipid panel; Future  2. Hot flashes, menopausal - venlafaxine XR (EFFEXOR-XR) 37.5 MG 24 hr capsule; TAKE ONE CAPSULE BY MOUTH ONCE DAILY WITH BREAKFAST  Dispense: 30 capsule; Refill: 11  3. Migraine without aura and without status migrainosus, not intractable  4. Smoker Discussed need for cessation. Offered medications to help with cessation. She defers.   5. Intractable chronic migraine without aura and with status migrainosus - eletriptan (RELPAX) 40 MG tablet; Take 1 tablet (40 mg total) by mouth once as needed for migraine. may repeat in 2 hours if necessary  Dispense: 12 tablet; Refill: 2   Preventive Health Examination--The Following is Copied from CPE Performed 08/14/2013:  A. Screening Labs: She had Fasting labs 08/14/2013  B. Pap: She had a Pap smear here with me on 07/30/2011. Cytology was negative. HPV cotesting was performed and not detected. Therefore she can wait 5 years today repeat Pap smear.  C. Screening Mammogram: She says that she just got a letter from the mammogram placed letting her know that this is stable and she is going to call to schedule this herself.  D. DEXA/BMD: Can wait to perform this as she is just recently menopausal.  E. Colorectal Cancer Screening: She is only 48 years old and has no indication to start screening colonoscopies prior to age 89.  However because of some symptoms she was having she has already seen GI and had a colonoscopy with them  10/07/12.  I am able to locate it in the computer or but this was done at him and able to pull up the actual report of the findings. Patient unsure whether it showed any polyps  or whether it was normal.   F. Immunizations:  Tetanus: We have no record of her having a tetanus here and she does not think she's had one in 10 years. She is agreeable to update this. Pneumococcal: Given her smoking she needs a Pneumovax 23. Patient agreeable to get this today. Zostavax: Wait until age 6 discuss   - CBC with Differential - COMPLETE METABOLIC PANEL WITH GFR - Lipid panel - Vit D  25 hydroxy (rtn osteoporosis monitoring)   Need for prophylactic vaccination with combined diphtheria-tetanus-pertussis (DTP) vaccine - Tdap vaccine greater than or equal to 7yo IM  Need for prophylactic vaccination against Streptococcus pneumoniae (pneumococcus) - Pneumococcal conjugate vaccine 13-valent--This was supposed to be the Pneumovax 23--will discuss with staff   Signed, Karis Juba, PA, BSFM 04/03/2014 11:19 AM

## 2014-06-04 NOTE — Progress Notes (Signed)
Her LOV with me was 04/03/14. At that [place] she was agreeable to start Lipitor 80. To recheck FLP/LFT 6 weeks.  Call and remind her to come for fasting labs.  Verify if she is taking Lipitor.

## 2014-06-11 NOTE — Progress Notes (Signed)
Pt on Statin--No FLP/LFT in long time--call pt and remind her to come for fasting labs.

## 2014-06-28 ENCOUNTER — Other Ambulatory Visit: Payer: BLUE CROSS/BLUE SHIELD

## 2014-06-28 DIAGNOSIS — E785 Hyperlipidemia, unspecified: Secondary | ICD-10-CM

## 2014-06-28 LAB — HEPATIC FUNCTION PANEL
ALBUMIN: 4.1 g/dL (ref 3.5–5.2)
ALT: 24 U/L (ref 0–35)
AST: 17 U/L (ref 0–37)
Alkaline Phosphatase: 59 U/L (ref 39–117)
BILIRUBIN TOTAL: 0.5 mg/dL (ref 0.2–1.2)
Bilirubin, Direct: 0.1 mg/dL (ref 0.0–0.3)
Indirect Bilirubin: 0.4 mg/dL (ref 0.2–1.2)
Total Protein: 6.8 g/dL (ref 6.0–8.3)

## 2014-06-28 LAB — LIPID PANEL
CHOL/HDL RATIO: 4.7 ratio
Cholesterol: 159 mg/dL (ref 0–200)
HDL: 34 mg/dL — ABNORMAL LOW (ref >39)
LDL Cholesterol: 92 mg/dL (ref 0–99)
TRIGLYCERIDES: 163 mg/dL — AB (ref <150)
VLDL: 33 mg/dL (ref 0–40)

## 2014-07-10 ENCOUNTER — Other Ambulatory Visit: Payer: Self-pay | Admitting: Physician Assistant

## 2014-07-10 NOTE — Telephone Encounter (Signed)
Medication refilled per protocol. 

## 2014-07-24 ENCOUNTER — Encounter: Payer: Self-pay | Admitting: Family Medicine

## 2014-07-24 ENCOUNTER — Ambulatory Visit (INDEPENDENT_AMBULATORY_CARE_PROVIDER_SITE_OTHER): Payer: BLUE CROSS/BLUE SHIELD | Admitting: Family Medicine

## 2014-07-24 VITALS — BP 120/66 | HR 78 | Temp 98.3°F | Resp 16 | Ht 62.0 in | Wt 158.0 lb

## 2014-07-24 DIAGNOSIS — M436 Torticollis: Secondary | ICD-10-CM

## 2014-07-24 DIAGNOSIS — M25512 Pain in left shoulder: Secondary | ICD-10-CM

## 2014-07-24 DIAGNOSIS — J0100 Acute maxillary sinusitis, unspecified: Secondary | ICD-10-CM

## 2014-07-24 DIAGNOSIS — G8929 Other chronic pain: Secondary | ICD-10-CM | POA: Insufficient documentation

## 2014-07-24 DIAGNOSIS — M25519 Pain in unspecified shoulder: Secondary | ICD-10-CM

## 2014-07-24 MED ORDER — CYCLOBENZAPRINE HCL 10 MG PO TABS: 10.0000 mg | ORAL_TABLET | Freq: Two times a day (BID) | ORAL | Status: DC | PRN

## 2014-07-24 MED ORDER — TRAMADOL HCL 50 MG PO TABS: 50.0000 mg | ORAL_TABLET | Freq: Three times a day (TID) | ORAL | Status: DC | PRN

## 2014-07-24 MED ORDER — FLUTICASONE PROPIONATE 50 MCG/ACT NA SUSP: 2.0000 | Freq: Every day | NASAL | Status: DC

## 2014-07-24 MED ORDER — AMOXICILLIN 500 MG PO CAPS: 500.0000 mg | ORAL_CAPSULE | Freq: Three times a day (TID) | ORAL | Status: DC

## 2014-07-24 NOTE — Progress Notes (Signed)
Patient ID: Jessica Shaw, female   DOB: 03-24-1966, 49 y.o.   MRN: [id number]   Subjective:    Patient ID: Jessica Shaw, female    DOB: 20-Jun-1965, 49 y.o.   MRN: [id number]  Patient presents for Neck Stiffness and Sinus Pressure patient here with neck stiffness for the past couple days she states that she will go with some pain the back of her neck into her shoulder blade and shoulder. She has history of chronic shoulder pain has had multiple arthroscopic done on her left shoulder. She has pain when she tries to turn her neck mostly towards her left side. She has not taken any over-the-counter medications.   She also complains of sinus pressure and drainage this is worsening over the past few days. She has a history of chronic migraines and she is not sure if her sinuses are contributing. She does tell me that she had a sinus surgery and clean out in the past. She takes Relpax almost daily to control her headache she has been seen by a neurologist and other specialist has been on multiple different prophylactic medications none of which have helped. She feels pain and pressure into her teeth when she bites down as well as some swelling across her cheeks. She has not had any fever no cough.    Review Of Systems:  GEN- denies fatigue, fever, weight loss,weakness, recent illness HEENT- denies eye drainage, change in vision, nasal discharge, CVS- denies chest pain, palpitations RESP- denies SOB, cough, wheeze ABD- denies N/V, change in stools, abd pain GU- denies dysuria, hematuria, dribbling, incontinence MSK- + joint pain, muscle aches, injury Neuro- +headache, dizziness, syncope, seizure activity       Objective:    BP 120/66 mmHg  Pulse 78  Temp(Src) 98.3 F (36.8 C) (Oral)  Resp 16  Ht 5\' 2"  (1.575 m)  Wt 158 lb (71.668 kg)  BMI 28.89 kg/m2 GEN- NAD, alert and oriented x3, non toxic appearing HEENT- PERRL, EOMI, non injected sclera, pink conjunctiva, MMM, oropharynx  clear, + TTP maxillary sinus and frontal sinus,mild swelling f turbinates Neck- Supple, no LAD, spasm of trapesius, tight SCM noted on left side, decreased rotation of neck , fair flexion of neck. 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 Any transcriptional errors that result from this process are unintentional.

## 2014-07-24 NOTE — Patient Instructions (Signed)
Take the muscle relaxer Take and anti-inflammatory during the day ( Advil ,aleve), ultram at night if needed Use sinus medication  F/U as needed

## 2014-07-24 NOTE — Assessment & Plan Note (Signed)
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

## 2014-08-12 ENCOUNTER — Other Ambulatory Visit: Payer: Self-pay | Admitting: Physician Assistant

## 2014-08-13 NOTE — Telephone Encounter (Signed)
LRF 04/02/14 #12 + 2  OK refill?

## 2014-08-13 NOTE — Telephone Encounter (Signed)
RX sent

## 2014-08-13 NOTE — Telephone Encounter (Signed)
Approved for #10+3 additional refills.

## 2014-09-28 ENCOUNTER — Encounter: Payer: Self-pay | Admitting: Podiatry

## 2014-09-28 ENCOUNTER — Ambulatory Visit (INDEPENDENT_AMBULATORY_CARE_PROVIDER_SITE_OTHER): Payer: BLUE CROSS/BLUE SHIELD | Admitting: Podiatry

## 2014-09-28 ENCOUNTER — Ambulatory Visit (INDEPENDENT_AMBULATORY_CARE_PROVIDER_SITE_OTHER): Payer: BLUE CROSS/BLUE SHIELD

## 2014-09-28 VITALS — BP 123/91 | HR 95 | Resp 16 | Ht 62.0 in | Wt 155.0 lb

## 2014-09-28 DIAGNOSIS — M779 Enthesopathy, unspecified: Secondary | ICD-10-CM | POA: Diagnosis not present

## 2014-09-28 MED ORDER — TRIAMCINOLONE ACETONIDE 10 MG/ML IJ SUSP
10.0000 mg | Freq: Once | INTRAMUSCULAR | Status: AC
  Administered 2014-09-28: 10 mg

## 2014-09-28 NOTE — Progress Notes (Signed)
   Subjective:    Patient ID: Jessica Shaw, female    DOB: 1966-03-29, 49 y.o.   MRN: [id number]  HPI   right foot pain in the ball of foot, right below the second toe, it has been going on for about 2 months. Tapping the toe makes it feel numb     Review of Systems  All other systems reviewed and are negative.      Objective:   Physical Exam        Assessment & Plan:

## 2014-10-01 NOTE — Progress Notes (Signed)
Subjective:     Patient ID: Jessica Shaw, female   DOB: [birth year], 49 y.o.   MRN: [id number]  HPI patient states that she is getting a lot of pain in the second metatarsal joint for the last several months. States that she has tried to reduce activity and walk differently which is not making it better   Review of Systems  All other systems reviewed and are negative.      Objective:   Physical Exam  Constitutional: She is oriented to person, place, and time.  Cardiovascular: Intact distal pulses.   Musculoskeletal: Normal range of motion.  Neurological: She is oriented to person, place, and time.  Skin: Skin is warm.  Nursing note and vitals reviewed.  neurovascular status intact with muscle strength adequate and range of motion subtalar midtarsal joint within normal limits. Patient's noted to have fluid buildup with inflammation and pain around the second metatarsophalangeal joint and does not have significant movement of the toe itself. Patient does not have pain along the metatarsal shaft was noted to have good digital perfusion and is well oriented 3     Assessment:     Inflammatory capsulitis second MPJ right with inflammation fluid buildup around the joint    Plan:     H&P and x-rays reviewed. Today I went ahead did a sterile proximal nerve block was 60 mg Xylocaine Marcaine mixture I then explain the risk of joint aspiration and patient is willing to accept risk and I carefully aspirated the joint and was able to get out a quarter cc of clear serosanguineous fluid. I then injected with a quarter cc of dextran some Kenalog and applied thick plantar padding and reappoint and discussed long-term orthotics to reduce stress against the joint

## 2014-10-04 ENCOUNTER — Other Ambulatory Visit: Payer: Self-pay | Admitting: Physician Assistant

## 2014-10-04 DIAGNOSIS — Z1231 Encounter for screening mammogram for malignant neoplasm of breast: Secondary | ICD-10-CM

## 2014-10-12 ENCOUNTER — Ambulatory Visit (INDEPENDENT_AMBULATORY_CARE_PROVIDER_SITE_OTHER): Payer: BLUE CROSS/BLUE SHIELD | Admitting: Podiatry

## 2014-10-12 ENCOUNTER — Encounter: Payer: Self-pay | Admitting: Podiatry

## 2014-10-12 VITALS — BP 126/82 | HR 97 | Resp 16

## 2014-10-12 DIAGNOSIS — M779 Enthesopathy, unspecified: Secondary | ICD-10-CM | POA: Diagnosis not present

## 2014-10-12 LAB — SEDIMENTATION RATE: Sed Rate: 2 mm/hr (ref 0–32)

## 2014-10-12 MED ORDER — DICLOFENAC SODIUM 75 MG PO TBEC: 75.0000 mg | DELAYED_RELEASE_TABLET | Freq: Two times a day (BID) | ORAL | Status: DC

## 2014-10-12 NOTE — Progress Notes (Signed)
Subjective:     Patient ID: Jessica Shaw, female   DOB: [birth year], 49 y.o.   MRN: [id number]  HPI patient presents stating I'm still having a lot of pain in my right second MPJ and I did have improvement that occurred 4 about 10 days and now reoccurrence and it seems I need something to keep pressure off the area   Review of Systems     Objective:   Physical Exam Neurovascular status intact muscle strength adequate with range of motion within normal limits. Patient's found to have inflammation and pain second MPJ right with fluid buildup around the joint surface and pain when palpated    Assessment:     Inflammatory capsulitis still present second MPJ right despite conservative care with a reduction of the acute pain from previous visit    Plan:     H&P and x-rays reviewed with patient again today I recommended long-term orthotics to try to reduce the stress and provide for complete arch support and heel counter. Patient wants this done and I scanned for custom orthotics and advised on physical therapy and rigid bottom shoes. Reappoint in 4 weeks when they are ready

## 2014-10-13 LAB — RHEUMATOID FACTOR: Rhuematoid fact SerPl-aCnc: 8.3 IU/mL (ref 0.0–13.9)

## 2014-10-13 LAB — URIC ACID: URIC ACID: 5.5 mg/dL (ref 2.5–7.1)

## 2014-10-13 LAB — ANA: ANA: NEGATIVE

## 2014-10-13 LAB — C-REACTIVE PROTEIN: CRP: 0.7 mg/L (ref 0.0–4.9)

## 2014-10-22 ENCOUNTER — Ambulatory Visit (HOSPITAL_COMMUNITY): Payer: Self-pay

## 2014-11-05 ENCOUNTER — Encounter: Payer: Self-pay | Admitting: *Deleted

## 2014-12-26 ENCOUNTER — Ambulatory Visit (HOSPITAL_COMMUNITY)
Admission: RE | Admit: 2014-12-26 | Discharge: 2014-12-26 | Disposition: A | Discharge status: Home / Self Care | Payer: BLUE CROSS/BLUE SHIELD | Source: Ambulatory Visit | Attending: Physician Assistant | Admitting: Physician Assistant

## 2014-12-26 ENCOUNTER — Other Ambulatory Visit: Payer: Self-pay | Admitting: Physician Assistant

## 2014-12-26 DIAGNOSIS — Z1231 Encounter for screening mammogram for malignant neoplasm of breast: Secondary | ICD-10-CM | POA: Diagnosis not present

## 2014-12-26 NOTE — Telephone Encounter (Signed)
Medication refilled per protocol. 

## 2015-01-15 ENCOUNTER — Other Ambulatory Visit: Payer: Self-pay | Admitting: Physician Assistant

## 2015-01-15 NOTE — Telephone Encounter (Signed)
Medication filled x1 with no refills.   Requires office visit before any further refills can be given.   Letter sent.  

## 2015-02-15 ENCOUNTER — Other Ambulatory Visit: Payer: Self-pay | Admitting: Physician Assistant

## 2015-02-15 NOTE — Telephone Encounter (Signed)
Refill denied.   Requires office visit before any further refills can be given.   Letter sent.

## 2015-02-28 ENCOUNTER — Encounter: Payer: Self-pay | Admitting: Physician Assistant

## 2015-02-28 ENCOUNTER — Ambulatory Visit (INDEPENDENT_AMBULATORY_CARE_PROVIDER_SITE_OTHER): Payer: BLUE CROSS/BLUE SHIELD | Admitting: Physician Assistant

## 2015-02-28 VITALS — BP 108/72 | HR 76 | Temp 98.2°F | Resp 18 | Ht 61.0 in | Wt 158.0 lb

## 2015-02-28 DIAGNOSIS — Z8774 Personal history of (corrected) congenital malformations of heart and circulatory system: Secondary | ICD-10-CM

## 2015-02-28 DIAGNOSIS — Z9889 Other specified postprocedural states: Secondary | ICD-10-CM | POA: Diagnosis not present

## 2015-02-28 DIAGNOSIS — Z0000 Encounter for general adult medical examination without abnormal findings: Secondary | ICD-10-CM

## 2015-02-28 DIAGNOSIS — E785 Hyperlipidemia, unspecified: Secondary | ICD-10-CM

## 2015-02-28 DIAGNOSIS — N951 Menopausal and female climacteric states: Secondary | ICD-10-CM | POA: Diagnosis not present

## 2015-02-28 DIAGNOSIS — K219 Gastro-esophageal reflux disease without esophagitis: Secondary | ICD-10-CM | POA: Diagnosis not present

## 2015-02-28 DIAGNOSIS — Z23 Encounter for immunization: Secondary | ICD-10-CM

## 2015-02-28 DIAGNOSIS — F17200 Nicotine dependence, unspecified, uncomplicated: Secondary | ICD-10-CM

## 2015-02-28 DIAGNOSIS — Z720 Tobacco use: Secondary | ICD-10-CM

## 2015-02-28 DIAGNOSIS — G43009 Migraine without aura, not intractable, without status migrainosus: Secondary | ICD-10-CM

## 2015-02-28 LAB — COMPLETE METABOLIC PANEL WITH GFR
ALT: 26 U/L (ref 6–29)
AST: 19 U/L (ref 10–35)
Albumin: 4.6 g/dL (ref 3.6–5.1)
Alkaline Phosphatase: 68 U/L (ref 33–115)
BILIRUBIN TOTAL: 0.5 mg/dL (ref 0.2–1.2)
BUN: 19 mg/dL (ref 7–25)
CALCIUM: 9.9 mg/dL (ref 8.6–10.2)
CO2: 28 mmol/L (ref 20–31)
Chloride: 106 mmol/L (ref 98–110)
Creat: 0.65 mg/dL (ref 0.50–1.10)
GFR, Est Non African American: >89 mL/min (ref >=60)
Glucose, Bld: 98 mg/dL (ref 70–99)
Potassium: 4.1 mmol/L (ref 3.5–5.3)
Sodium: 140 mmol/L (ref 135–146)
Total Protein: 6.8 g/dL (ref 6.1–8.1)

## 2015-02-28 LAB — LIPID PANEL
CHOLESTEROL: 175 mg/dL (ref 125–200)
HDL: 45 mg/dL — ABNORMAL LOW (ref >=46)
LDL Cholesterol: 107 mg/dL (ref <130)
TRIGLYCERIDES: 113 mg/dL (ref <150)
Total CHOL/HDL Ratio: 3.9 Ratio (ref <=5.0)
VLDL: 23 mg/dL (ref <30)

## 2015-02-28 LAB — CBC WITH DIFFERENTIAL/PLATELET
BASOS ABS: 0.1 10*3/uL (ref 0.0–0.1)
Basophils Relative: 1 % (ref 0–1)
EOS PCT: 3 % (ref 0–5)
Eosinophils Absolute: 0.4 10*3/uL (ref 0.0–0.7)
HEMATOCRIT: 42.4 % (ref 36.0–46.0)
HEMOGLOBIN: 14.6 g/dL (ref 12.0–15.0)
LYMPHS ABS: 3.9 10*3/uL (ref 0.7–4.0)
LYMPHS PCT: 30 % (ref 12–46)
MCH: 30.7 pg (ref 26.0–34.0)
MCHC: 34.4 g/dL (ref 30.0–36.0)
MCV: 89.1 fL (ref 78.0–100.0)
MONO ABS: 0.7 10*3/uL (ref 0.1–1.0)
MPV: 9.8 fL (ref 8.6–12.4)
Monocytes Relative: 5 % (ref 3–12)
NEUTROS ABS: 8 10*3/uL — AB (ref 1.7–7.7)
Neutrophils Relative %: 61 % (ref 43–77)
Platelets: 283 10*3/uL (ref 150–400)
RBC: 4.76 MIL/uL (ref 3.87–5.11)
RDW: 13.7 % (ref 11.5–15.5)
WBC: 13.1 10*3/uL — AB (ref 4.0–10.5)

## 2015-02-28 MED ORDER — VENLAFAXINE HCL ER 75 MG PO CP24
75.0000 mg | ORAL_CAPSULE | Freq: Every day | ORAL | Status: DC
Start: 2015-02-28 — End: 2015-09-05

## 2015-02-28 MED ORDER — ATORVASTATIN CALCIUM 80 MG PO TABS: ORAL_TABLET | ORAL | Status: DC

## 2015-02-28 MED ORDER — ELETRIPTAN HYDROBROMIDE 40 MG PO TABS: ORAL_TABLET | ORAL | Status: DC

## 2015-02-28 NOTE — Progress Notes (Signed)
Patient ID: DALLIS DARDEN MRN: [id number], DOB: July 08, 1965, 49 y.o. Date of Encounter: 02/28/2015,   Chief Complaint: Routine f/u OV.  HPI: 49 y.o. y/o white female  here for routine f/u OV.   THE FOLLOWING IS COPIED FROM HER CPE NOTE WITH ME 08/14/2013:   She just had an office visit with me 08/07/13 secondary to recent onset of hot flashes. The following is copied from that office visit note:  " This just started all of a sudden in the past 3 weeks. But says that she is having severe hot flashes. Says at night she is waking up throughout the night secondary to her clothes and bedding making her so hot. Says multiple nights she ends up having to take her clothes off during the night because she is in a sweat. She keeps a cup of ice water by her bed to drink through the night. Has even had to get up and take a shower in the middle of the night. Is getting decreased sleep secondary to this. She also has problems with this during the day all day long.  Reports that she had a uterine ablation around June 2012. This was performed secondary to heavy bleeding. Has had no menstrual bleeding since the ablation. Therefore has no idea whether she's gone through menopause regarding bleeding cycles.  She has noticed no significant weight change, no palpitation, no significant change in hair or skin.  Has had no fever and no cough."  At that visit we discussed:  (Below is copied from that note dated 08/07/13): Discussed with her that we will check labs--FSH, LH, TSH.  If TSH is normal but her [place] and LH do indicate menopause, then we could discuss options for treating hot flashes.  Today I went ahead and discussed with her the options so that we can begin that conversation.  Option 1: HRT  Discussed that risks include:  A. risk of blood clot/CAD/CVD  ---Smoker; discussed that this would increase this risk and that she really would need smoking cessation for [place] to further discuss this option  ---History  states h/o 'blood clot arm" and h/o TIA  However, she reports that this all happened when she had a tear in her left bicep tendon. States that this injury occurred but she continued to work and therefore developed a lot of swelling in the arm and with this compression this is what caused the blood clot in the arm. States that she has had to have multiple surgeries to that left arm since then.  Also, as it turns out, she ended up having a PF O. which has been closed.  This was the cause of the TIA. Underlying cause has been fixed.  --She has no other personal history of CAD or PVD.  --No family history of premature CAD or PVD  B. discussed that hormone replacement therapy does not actually cause breast or uterine cancer but can speed up their growth if cancer Present.  She reports that she does have a history of cervical dysplasia and had what sounds like a LEEP procedure done to this. However had no Cervical Cancer.  No h/o uterine cancer or breast cancer or ovarian cancer.  She has no family history of any of these GYN cancers  She is going to schedule a complete physical exam so that we can perform breast exam pelvic exam and make sure her mammogram is up-to-date  Option 2: Venlafaxine or other SSRI.  She is to schedule for  complete physical exam in the next one to 2 weeks and we will further discuss treatment of hot flashes at that visit if the labs are consistent with hot flashes.    she now presents for followup. She says that she has thought things over and is concerned about starting hormone replacement therapy given her risk factor profile. Does want to start the venlafaxine. Otherwise, she has no new complaints today. Just here to get her physical/preventive care caught up.  AT THAT OV 08/14/2013 THE ASSESSMENT/PLAN WAS:  2. Hot flashes, menopausal It is noted that she has -triptan that she can use as needed for migraines but she rarely uses these. Aware of risk of possible serotonin  syndrome. However risk associated with HRT or even higher for her so she accepts this possible risk and wants to proceed with the venlafaxine. As well we will be using low dose. - venlafaxine XR (EFFEXOR XR) 37.5 MG 24 hr capsule; Take 1 capsule (37.5 mg total) by mouth daily with breakfast.  Dispense: 30 capsule; Refill: 5  3. Smoker Discussed need for cessation. Offered medications to help with cessation. She defers.  4. Migraine Rarely needs -triptan. Migraines have been very minimal.    OV--04/02/2014: Today she reports that the Effexor is working great. She says that after being on that medicine for just a couple weeks her hot flashes stopped. She is having absolutely no hot flashes anymore. She is having no adverse effects from the medicine.  Today I also reviewed that 08/14/13 labs included an LDL of 207 and I had stated to start Lipitor 80 mg. However, today patient states that she does not know anything about that and never started the Lipitor.  States that she still is not having migraines frequently but says that when she does the Relpax works very well for her.   Says that "the Effexor and Relpax are Gold to Her !! "   OV/CPE---02/28/2015: Says that the Effexor is still working well for her but says that it does seem like her body has gotten used to it and is not quite as effective as it was in the past. Is having some minor hot flashes--but not nearly as bad as they were prior to treatment.  Is wondering if dose can be increased. Says that she will sometimes have the beginning of a migraine but will take just a half of a Relpax and it will resolve. No concerns to address today.  Past Medical History  Diagnosis Date  . Embolism - blood clot     arm  . Stroke     TIA  . GERD (gastroesophageal reflux disease)   . Complication of anesthesia   . PONV (postoperative nausea and vomiting)   . Migraine   . Smoker   . HLD (hyperlipidemia)      Past Surgical History  Procedure  Laterality Date  . Shoulder arthroscopy      left  . Tubal ligation    . Cholecystectomy    . Spinal cord stimulator implant    . Patent foramen ovale closure    . Esophagogastroduodenoscopy  04/20/2003    BTD:VVOH changes of reflux esophagitis/Limited gastroesophageal junction, otherwise normal  . Colonoscopy  08/04/2005    YWV:PXTGG external hemorrhoids and anal papilla, otherwise normal  . Colonoscopy N/A 10/07/2012    Procedure: COLONOSCOPY;  Surgeon: Daneil Dolin, MD;  Location: [facility];  Service: Endoscopy;  Laterality: N/A;  10:30  . Esophagogastroduodenoscopy N/A 10/07/2012  Procedure: ESOPHAGOGASTRODUODENOSCOPY (EGD);  Surgeon: Daneil Dolin, MD;  Location: [facility];  Service: Endoscopy;  Laterality: N/A;    Home Meds:  Outpatient Prescriptions Prior to Visit  Medication Sig Dispense Refill  . atorvastatin (LIPITOR) 80 MG tablet TAKE ONE TABLET BY MOUTH ONCE DAILY AT 6 P.M. 30 tablet 0  . fluticasone (FLONASE) 50 MCG/ACT nasal spray Place 2 sprays into both nostrils daily. 16 g 6  . RELPAX 40 MG tablet TAKE ONE TABLET BY MOUTH ONCE AS NEEDED FOR  MIGRAINE.  MAY  REPEAT  IN  2  HOURS  IF  NECESSARY 10 tablet 1  . venlafaxine XR (EFFEXOR-XR) 37.5 MG 24 hr capsule   5  . cyclobenzaprine (FLEXERIL) 10 MG tablet Take 1 tablet (10 mg total) by mouth 2 (two) times daily as needed for muscle spasms. (Patient not taking: Reported on 09/28/2014) 30 tablet 2  . diclofenac (VOLTAREN) 75 MG EC tablet Take 1 tablet (75 mg total) by mouth 2 (two) times daily. (Patient not taking: Reported on 02/28/2015) 50 tablet 2  . traMADol (ULTRAM) 50 MG tablet Take 1 tablet (50 mg total) by mouth every 8 (eight) hours as needed. (Patient not taking: Reported on 09/28/2014) 30 tablet 1   No facility-administered medications prior to visit.     Allergies:  Allergies  Allergen Reactions  . Aspirin Other (See Comments)    G.I. Upset   . Demerol [Meperidine]     Gi upset,   . Morphine And  Related Other (See Comments)    G.I. Upset  . Latex Itching and Rash    Social History   Social History  . Marital Status: Married    Spouse Name: N/A  . Number of Children: N/A  . Years of Education: N/A   Occupational History  . inspector    Social History Main Topics  . Smoking status: Current Every Day Smoker -- 0.50 packs/day for 30 years    Types: Cigarettes  . Smokeless tobacco: Never Used  . Alcohol Use: No  . Drug Use: No  . Sexual Activity: Yes   Other Topics Concern  . Not on file   Social History Narrative   Today patient informed me that her father, who is actually a patient of mine-- named  Chief of Staff-- I had no idea of this until today!!   She says that he actually lives with them now.   She also lives with her husband and her 70 year old son.   Says she also has one other son who is 25 and married with a child.      She works as a Pharmacist, hospital at [employer].   Does no exercise.   Has smoked about 1/2 ppd since age 78 years old-- now this has been for 30 years.(as of 2015)   I did not know until today when she told me that her father is actually a patient of mine, named Chief of Staff.!! Patient states that her father actually lives with her. As well she lives with her husband and her son who is 30. Says she has another son who is 21 years old and who is married with a child. 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 Does no exercise.  Family History  Problem Relation Age of Onset  . Cancer Mother     unsure primary, deceased at age 66  . Diabetes    . Colon cancer Neg Hx   .  Diabetes Father   . Diabetes Brother   . Diabetes Brother   . Diabetes Brother   . Diabetes Brother   . Diabetes Brother     Physical Exam: Blood pressure 108/72, pulse 76, temperature 98.2 F (36.8 C), temperature source Oral, resp. rate 18, height 5\' 1"  (1.549 m), weight 158 lb (71.668 kg)., Body mass index is  29.87 kg/(m^2). General: Well developed, well nourished,WF. Appears in no acute distress. Neck: Supple. Trachea midline. No thyromegaly. Full ROM. No lymphadenopathy.No Carotid Bruits. Lungs: Clear to auscultation bilaterally without wheezes, rales, or rhonchi. Breathing is of normal effort and unlabored. Cardiovascular: RRR with S1 S2. No murmurs, rubs, or gallops. Distal pulses 2+ symmetrically. No carotid or abdominal bruits. Breast Exam: No masses. No skin changes. No nipple discharge. No enlarged lymph nodes. Breast exam normal. Pelvic exam: External genitalia normal. Vaginal mucosa normal. Cervix normal. Bimanual exam normal with no cervical motion tenderness and no mass. Abdomen: Soft, non-tender, non-distended with normoactive bowel sounds. No hepatosplenomegaly or masses. No rebound/guarding. No CVA tenderness. No hernias.  Musculoskeletal: Full range of motion and 5/5 strength throughout.  Skin: Warm and moist. Neuro: A+Ox3. CN II-XII grossly intact. Moves all extremities spontaneously.  Psych:  Responds to questions appropriately with a normal affect.   Assessment/Plan:  49 y.o. y/o female here for    Preventive Health Examination  A. Screening Labs: She is fasting. Check CBC CMET FLP TSH vitamin D.  B. Pap: She had a Pap smear here with me on 07/30/2011. Cytology was negative. HPV cotesting was performed and not detected. Therefore she can wait 5 years today repeat Pap smear.  C. Screening Mammogram: She had mammograms 12/26/2014. Negative.  D. DEXA/BMD: Can wait to perform this as she is just recently menopausal.  E. Colorectal Cancer Screening: She is only 49 years old and has no indication to start screening colonoscopies prior to age 47.  However because of some symptoms she was having she has already seen GI and had a colonoscopy with them  10/07/12.   I was able to find a colonoscopy report in epic but could not find the pathology report to determine when repeat  colonoscopy due. Colonoscopy was performed 10/07/2012 by Dr. Buford Dresser. She did have polyps biopsy. However pathology report unknown to me. Will have to find out these results and whether she is supposed to repeat colonoscopy in 3 years or 5 years. If 3 years, then it  is due 10/08/2015.  F. Immunizations:  Influenza vaccine--- She is agreeable to receive this today -- Given here 02/28/2015 Tetanus: Tdap given here 08/14/2013 Pneumococcal: Given her smoking she needs a Pneumovax 23. Pneumonia vaccine given here 08/14/2013. No further pneumonia vaccine indicated until age 31. Zostavax: Wait until age 79 to discuss  1. HLD (hyperlipidemia) She is on Lipitor 80 mg. Recheck FLP/LFT now.  2. Hot flashes, menopausal At OV 02/28/2015--- She reported that her hot flashes are still much improved but seem to have increased somewhat recently and not be completely controlled as they had been in the past therefore dose of venlafaxine increased from 37.5 to 75 mg --  3. Migraine without aura and without status migrainosus, not intractable Relpax works very well for her. She takes only a half of a pill and migraine resolves.  4. Smoker Discussed need for cessation. Offered medications to help with cessation. She defers.   Routine office visit and labs in 6 months or sooner if needed.    Signed, Olean Ree [place], [place], [place]  02/28/2015 8:09 AM

## 2015-03-01 ENCOUNTER — Encounter: Payer: Self-pay | Admitting: Family Medicine

## 2015-03-01 LAB — VITAMIN D 25 HYDROXY (VIT D DEFICIENCY, FRACTURES): Vit D, 25-Hydroxy: 37 ng/mL (ref 30–100)

## 2015-03-01 LAB — TSH: TSH: 0.79 u[IU]/mL (ref 0.350–4.500)

## 2015-03-12 ENCOUNTER — Telehealth: Payer: Self-pay

## 2015-03-12 NOTE — Telephone Encounter (Signed)
Mailed out card to pt letting her know that her orthotics where in and ready for pick up .

## 2015-07-04 ENCOUNTER — Other Ambulatory Visit: Payer: Self-pay | Admitting: Physician Assistant

## 2015-07-04 NOTE — Telephone Encounter (Signed)
Refill appropriate and filled per protocol. 

## 2015-08-05 ENCOUNTER — Other Ambulatory Visit: Payer: Self-pay | Admitting: Physician Assistant

## 2015-08-05 NOTE — Telephone Encounter (Signed)
Medication refilled per protocol. 

## 2015-09-02 ENCOUNTER — Other Ambulatory Visit: Payer: Self-pay | Admitting: Physician Assistant

## 2015-09-02 ENCOUNTER — Encounter: Payer: Self-pay | Admitting: Family Medicine

## 2015-09-02 NOTE — Telephone Encounter (Signed)
Medication refill for one time only.  Patient needs to be seen.  Letter sent for patient to call and schedule 

## 2015-09-05 ENCOUNTER — Ambulatory Visit: Payer: BLUE CROSS/BLUE SHIELD | Admitting: Physician Assistant

## 2015-09-05 ENCOUNTER — Ambulatory Visit (INDEPENDENT_AMBULATORY_CARE_PROVIDER_SITE_OTHER): Payer: BLUE CROSS/BLUE SHIELD | Admitting: Physician Assistant

## 2015-09-05 ENCOUNTER — Encounter: Payer: Self-pay | Admitting: Physician Assistant

## 2015-09-05 VITALS — BP 130/82 | HR 92 | Temp 98.7°F | Resp 18 | Wt 165.0 lb

## 2015-09-05 DIAGNOSIS — Z720 Tobacco use: Secondary | ICD-10-CM

## 2015-09-05 DIAGNOSIS — E785 Hyperlipidemia, unspecified: Secondary | ICD-10-CM | POA: Diagnosis not present

## 2015-09-05 DIAGNOSIS — G43009 Migraine without aura, not intractable, without status migrainosus: Secondary | ICD-10-CM | POA: Diagnosis not present

## 2015-09-05 DIAGNOSIS — N951 Menopausal and female climacteric states: Secondary | ICD-10-CM | POA: Diagnosis not present

## 2015-09-05 DIAGNOSIS — F17200 Nicotine dependence, unspecified, uncomplicated: Secondary | ICD-10-CM

## 2015-09-05 MED ORDER — ATORVASTATIN CALCIUM 80 MG PO TABS: ORAL_TABLET | ORAL | Status: DC

## 2015-09-05 MED ORDER — VENLAFAXINE HCL ER 75 MG PO CP24: 75.0000 mg | ORAL_CAPSULE | Freq: Every day | ORAL | Status: DC

## 2015-09-05 MED ORDER — ELETRIPTAN HYDROBROMIDE 40 MG PO TABS: ORAL_TABLET | ORAL | Status: DC

## 2015-09-05 NOTE — Progress Notes (Signed)
Patient ID: KYMBERLEIGH GAMBRELL MRN: [id number], DOB: 07-22-1965, 50 y.o. Date of Encounter: 09/05/2015,   Chief Complaint: Routine f/u OV.  HPI: 50 y.o. y/o white female  here for routine f/u OV.   THE FOLLOWING IS COPIED FROM HER CPE NOTE WITH ME 08/14/2013:   She just had an office visit with me 08/07/13 secondary to recent onset of hot flashes. The following is copied from that office visit note:  " This just started all of a sudden in the past 3 weeks. But says that she is having severe hot flashes. Says at night she is waking up throughout the night secondary to her clothes and bedding making her so hot. Says multiple nights she ends up having to take her clothes off during the night because she is in a sweat. She keeps a cup of ice water by her bed to drink through the night. Has even had to get up and take a shower in the middle of the night. Is getting decreased sleep secondary to this. She also has problems with this during the day all day long.  Reports that she had a uterine ablation around June 2012. This was performed secondary to heavy bleeding. Has had no menstrual bleeding since the ablation. Therefore has no idea whether she's gone through menopause regarding bleeding cycles.  She has noticed no significant weight change, no palpitation, no significant change in hair or skin.  Has had no fever and no cough."  At that visit we discussed:  (Below is copied from that note dated 08/07/13): Discussed with her that we will check labs--FSH, LH, TSH.  If TSH is normal but her [place] and LH do indicate menopause, then we could discuss options for treating hot flashes.  Today I went ahead and discussed with her the options so that we can begin that conversation.  Option 1: HRT  Discussed that risks include:  A. risk of blood clot/CAD/CVD  ---Smoker; discussed that this would increase this risk and that she really would need smoking cessation for [place] to further discuss this option  ---History  states h/o 'blood clot arm" and h/o TIA  However, she reports that this all happened when she had a tear in her left bicep tendon. States that this injury occurred but she continued to work and therefore developed a lot of swelling in the arm and with this compression this is what caused the blood clot in the arm. States that she has had to have multiple surgeries to that left arm since then.  Also, as it turns out, she ended up having a PF O. which has been closed.  This was the cause of the TIA. Underlying cause has been fixed.  --She has no other personal history of CAD or PVD.  --No family history of premature CAD or PVD  B. discussed that hormone replacement therapy does not actually cause breast or uterine cancer but can speed up their growth if cancer Present.  She reports that she does have a history of cervical dysplasia and had what sounds like a LEEP procedure done to this. However had no Cervical Cancer.  No h/o uterine cancer or breast cancer or ovarian cancer.  She has no family history of any of these GYN cancers  She is going to schedule a complete physical exam so that we can perform breast exam pelvic exam and make sure her mammogram is up-to-date  Option 2: Venlafaxine or other SSRI.  She is to schedule for  complete physical exam in the next one to 2 weeks and we will further discuss treatment of hot flashes at that visit if the labs are consistent with hot flashes.    she now presents for followup. She says that she has thought things over and is concerned about starting hormone replacement therapy given her risk factor profile. Does want to start the venlafaxine. Otherwise, she has no new complaints today. Just here to get her physical/preventive care caught up.  AT THAT OV 08/14/2013 THE ASSESSMENT/PLAN WAS:  2. Hot flashes, menopausal It is noted that she has -triptan that she can use as needed for migraines but she rarely uses these. Aware of risk of possible serotonin  syndrome. However risk associated with HRT or even higher for her so she accepts this possible risk and wants to proceed with the venlafaxine. As well we will be using low dose. - venlafaxine XR (EFFEXOR XR) 37.5 MG 24 hr capsule; Take 1 capsule (37.5 mg total) by mouth daily with breakfast.  Dispense: 30 capsule; Refill: 5  3. Smoker Discussed need for cessation. Offered medications to help with cessation. She defers.  4. Migraine Rarely needs -triptan. Migraines have been very minimal.    OV--04/02/2014: Today she reports that the Effexor is working great. She says that after being on that medicine for just a couple weeks her hot flashes stopped. She is having absolutely no hot flashes anymore. She is having no adverse effects from the medicine.  Today I also reviewed that 08/14/13 labs included an LDL of 207 and I had stated to start Lipitor 80 mg. However, today patient states that she does not know anything about that and never started the Lipitor.  States that she still is not having migraines frequently but says that when she does the Relpax works very well for her.   Says that "the Effexor and Relpax are Gold to Her !! "   OV/CPE---02/28/2015: Says that the Effexor is still working well for her but says that it does seem like her body has gotten used to it and is not quite as effective as it was in the past. Is having some minor hot flashes--but not nearly as bad as they were prior to treatment.  Is wondering if dose can be increased. Says that she will sometimes have the beginning of a migraine but will take just a half of a Relpax and it will resolve. No concerns to address today.  OV 09/05/2015: She is taking the Lipitor 80 mg as directed. No myalgias or other adverse effects. She is taking the effexor. This continues to work well for her and is causing no adverse effects. The Relpax continues to work well for her migraines. She has no specific complaints or concerns today.  Straight it with weight gain.  Says that she would nose that she should quit smoking but is afraid she weighed 300 pounds if she quit and also says that this is the one thing that she does that she enjoys.  Past Medical History  Diagnosis Date  . Embolism - blood clot     arm  . Stroke [facility])     TIA  . GERD (gastroesophageal reflux disease)   . Complication of anesthesia   . PONV (postoperative nausea and vomiting)   . Migraine   . Smoker   . HLD (hyperlipidemia)      Past Surgical History  Procedure Laterality Date  . Shoulder arthroscopy      left  .  Tubal ligation    . Cholecystectomy    . Spinal cord stimulator implant    . Patent foramen ovale closure    . Esophagogastroduodenoscopy  04/20/2003    [id number] changes of reflux esophagitis/Limited gastroesophageal junction, otherwise normal  . Colonoscopy  08/04/2005    [id number] external hemorrhoids and anal papilla, otherwise normal  . Colonoscopy N/A 10/07/2012    Procedure: COLONOSCOPY;  Surgeon: Daneil Dolin, MD;  Location: [facility];  Service: Endoscopy;  Laterality: N/A;  10:30  . Esophagogastroduodenoscopy N/A 10/07/2012    Procedure: ESOPHAGOGASTRODUODENOSCOPY (EGD);  Surgeon: Daneil Dolin, MD;  Location: [facility];  Service: Endoscopy;  Laterality: N/A;    Home Meds:  Outpatient Prescriptions Prior to Visit  Medication Sig Dispense Refill  . atorvastatin (LIPITOR) 80 MG tablet TAKE ONE TABLET BY MOUTH ONCE DAILY . 90 tablet 1  . fluticasone (FLONASE) 50 MCG/ACT nasal spray Place 2 sprays into both nostrils daily. 16 g 6  . RELPAX 40 MG tablet TAKE ONE TABLET BY MOUTH ONCE DAILY AS NEEDED FOR MIGRAINE. MAY REPEAT IN 2 HOURS IF NEEDED 12 tablet 0  . venlafaxine XR (EFFEXOR XR) 75 MG 24 hr capsule Take 1 capsule (75 mg total) by mouth daily with breakfast. 30 capsule 5   No facility-administered medications prior to visit.     Allergies:  Allergies  Allergen Reactions  . Aspirin Other (See Comments)     G.I. Upset   . Demerol [Meperidine]     Gi upset,   . Morphine And Related Other (See Comments)    G.I. Upset  . Latex Itching and Rash    Social History   Social History  . Marital Status: Married    Spouse Name: N/A  . Number of Children: N/A  . Years of Education: N/A   Occupational History  . inspector    Social History Main Topics  . Smoking status: Current Every Day Smoker -- 0.50 packs/day for 30 years    Types: Cigarettes  . Smokeless tobacco: Never Used  . Alcohol Use: No  . Drug Use: No  . Sexual Activity: Yes   Other Topics Concern  . Not on file   Social History Narrative   Today patient informed me that her father, who is actually a patient of mine-- named  Chief of Staff-- I had no idea of this until today!!   She says that he actually lives with them now.   She also lives with her husband and her 57 year old son.   Says she also has one other son who is 59 and married with a child.      She works as a Pharmacist, hospital at [employer].   Does no exercise.   Has smoked about 1/2 ppd since age 64 years old-- now this has been for 30 years.(as of 2015)   I did not know until today when she told me that her father is actually a patient of mine, named Chief of Staff.!! Patient states that her father actually lives with her. As well she lives with her husband and her son who is 12. Says she has another son who is 72 years old and who is married with a child. 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 Does no exercise.  Family History  Problem Relation Age of Onset  . Cancer Mother     unsure primary, deceased at age 34  . Diabetes    .  Colon cancer Neg Hx   . Diabetes Father   . Diabetes Brother   . Diabetes Brother   . Diabetes Brother   . Diabetes Brother   . Diabetes Brother     Physical Exam: Blood pressure 130/82, pulse 92, temperature 98.7 F (37.1 C), temperature source Oral, resp. rate 18,  weight 165 lb (74.844 kg)., Body mass index is 31.19 kg/(m^2). General: Well developed, well nourished,WF. Appears in no acute distress. Neck: Supple. Trachea midline. No thyromegaly. Full ROM. No lymphadenopathy.No Carotid Bruits. Lungs: Clear to auscultation bilaterally without wheezes, rales, or rhonchi. Breathing is of normal effort and unlabored. Cardiovascular: RRR with S1 S2. No murmurs, rubs, or gallops. Distal pulses 2+ symmetrically. No carotid or abdominal bruits. Abdomen: Soft, non-tender, non-distended with normoactive bowel sounds. No hepatosplenomegaly or masses. No rebound/guarding. No CVA tenderness. No hernias.  Musculoskeletal: Full range of motion and 5/5 strength throughout.  Skin: Warm and moist. Neuro: A+Ox3. CN II-XII grossly intact. Moves all extremities spontaneously.  Psych:  Responds to questions appropriately with a normal affect.   Assessment/Plan:  50 y.o. y/o female here for    Preventive Health Examination   1. HLD (hyperlipidemia) She is on Lipitor 80 mg. Recheck FLP/LFT now.  2. Hot flashes, menopausal At OV 02/28/2015--- She reported that her hot flashes are still much improved but seem to have increased somewhat recently and not be completely controlled as they had been in the past therefore dose of venlafaxine increased from 37.5 to 75 mg --  3. Migraine without aura and without status migrainosus, not intractable Relpax works very well for her. She takes only a half of a pill and migraine resolves.  4. Smoker Discussed need for cessation. Offered medications to help with cessation. She defers.  Preventive Health Examination--THE FOLLOWING IS COPIED FROM OV NOTE 02/2015:  A. Screening Labs: She is fasting. Check CBC CMET FLP TSH vitamin D.  B. Pap: She had a Pap smear here with me on 07/30/2011. Cytology was negative. HPV cotesting was performed and not detected. Therefore she can wait 5 years today repeat Pap smear.  C. Screening Mammogram: She  had mammograms 12/26/2014. Negative.  D. DEXA/BMD: Can wait to perform this as she is just recently menopausal.  E. Colorectal Cancer Screening: She is only 50 years old and has no indication to start screening colonoscopies prior to age 50.  However because of some symptoms she was having she has already seen GI and had a colonoscopy with them  10/07/12.   I was able to find a colonoscopy report in epic but could not find the pathology report to determine when repeat colonoscopy due. Colonoscopy was performed 10/07/2012 by Dr. Buford Dresser. She did have polyps biopsy. However pathology report unknown to me. Will have to find out these results and whether she is supposed to repeat colonoscopy in 3 years or 5 years. If 3 years, then it  is due 10/08/2015.  F. Immunizations:  Influenza vaccine--- She is agreeable to receive this today -- Given here 02/28/2015 Tetanus: Tdap given here 08/14/2013 Pneumococcal: Given her smoking she needs a Pneumovax 23. Pneumonia vaccine given here 08/14/2013. No further pneumonia vaccine indicated until age 77. Zostavax: Wait until age 8 to discuss  Routine office visit and labs in 6 months or sooner if needed.    Marin Olp Williams, [place], BSFM 09/05/2015 3:00 PM

## 2015-09-06 LAB — COMPLETE METABOLIC PANEL WITH GFR
ALT: 30 U/L — AB (ref 6–29)
AST: 21 U/L (ref 10–35)
Albumin: 4.4 g/dL (ref 3.6–5.1)
Alkaline Phosphatase: 60 U/L (ref 33–115)
BILIRUBIN TOTAL: 0.4 mg/dL (ref 0.2–1.2)
BUN: 16 mg/dL (ref 7–25)
CALCIUM: 9.3 mg/dL (ref 8.6–10.2)
CO2: 26 mmol/L (ref 20–31)
CREATININE: 0.72 mg/dL (ref 0.50–1.10)
Chloride: 105 mmol/L (ref 98–110)
GFR, Est Non African American: >89 mL/min (ref >=60)
Glucose, Bld: 148 mg/dL — ABNORMAL HIGH (ref 70–99)
Potassium: 3.8 mmol/L (ref 3.5–5.3)
Sodium: 141 mmol/L (ref 135–146)
TOTAL PROTEIN: 6.7 g/dL (ref 6.1–8.1)

## 2015-10-13 ENCOUNTER — Other Ambulatory Visit: Payer: Self-pay | Admitting: Physician Assistant

## 2015-10-14 NOTE — Telephone Encounter (Signed)
Medication refilled per protocol. 

## 2015-10-20 ENCOUNTER — Other Ambulatory Visit: Payer: Self-pay | Admitting: Physician Assistant

## 2015-10-22 NOTE — Telephone Encounter (Signed)
PA submitted for medication.   PA approved.   Prescription sent to pharmacy.

## 2015-12-05 DIAGNOSIS — M9902 Segmental and somatic dysfunction of thoracic region: Secondary | ICD-10-CM | POA: Diagnosis not present

## 2015-12-05 DIAGNOSIS — M9903 Segmental and somatic dysfunction of lumbar region: Secondary | ICD-10-CM | POA: Diagnosis not present

## 2015-12-05 DIAGNOSIS — M9905 Segmental and somatic dysfunction of pelvic region: Secondary | ICD-10-CM | POA: Diagnosis not present

## 2015-12-05 DIAGNOSIS — M5442 Lumbago with sciatica, left side: Secondary | ICD-10-CM | POA: Diagnosis not present

## 2015-12-12 ENCOUNTER — Other Ambulatory Visit: Payer: Self-pay | Admitting: Physician Assistant

## 2015-12-12 NOTE — Telephone Encounter (Signed)
Medication refilled per protocol. 

## 2015-12-18 ENCOUNTER — Other Ambulatory Visit: Payer: Self-pay | Admitting: Physician Assistant

## 2015-12-18 DIAGNOSIS — Z1231 Encounter for screening mammogram for malignant neoplasm of breast: Secondary | ICD-10-CM

## 2015-12-27 ENCOUNTER — Ambulatory Visit (HOSPITAL_COMMUNITY)
Admission: RE | Admit: 2015-12-27 | Discharge: 2015-12-27 | Disposition: A | Discharge status: Home / Self Care | Payer: BLUE CROSS/BLUE SHIELD | Source: Ambulatory Visit | Attending: Physician Assistant | Admitting: Physician Assistant

## 2015-12-27 DIAGNOSIS — Z1231 Encounter for screening mammogram for malignant neoplasm of breast: Secondary | ICD-10-CM | POA: Insufficient documentation

## 2016-02-19 ENCOUNTER — Other Ambulatory Visit: Payer: Self-pay | Admitting: Physician Assistant

## 2016-03-10 ENCOUNTER — Other Ambulatory Visit: Payer: BLUE CROSS/BLUE SHIELD

## 2016-03-10 DIAGNOSIS — E785 Hyperlipidemia, unspecified: Secondary | ICD-10-CM | POA: Diagnosis not present

## 2016-03-10 DIAGNOSIS — F17200 Nicotine dependence, unspecified, uncomplicated: Secondary | ICD-10-CM

## 2016-03-10 DIAGNOSIS — Z79899 Other long term (current) drug therapy: Secondary | ICD-10-CM

## 2016-03-10 DIAGNOSIS — Z720 Tobacco use: Secondary | ICD-10-CM | POA: Diagnosis not present

## 2016-03-10 LAB — COMPLETE METABOLIC PANEL WITH GFR
ALBUMIN: 4.5 g/dL (ref 3.6–5.1)
ALK PHOS: 61 U/L (ref 33–115)
ALT: 27 U/L (ref 6–29)
AST: 19 U/L (ref 10–35)
BUN: 13 mg/dL (ref 7–25)
CO2: 25 mmol/L (ref 20–31)
Calcium: 10 mg/dL (ref 8.6–10.2)
Chloride: 104 mmol/L (ref 98–110)
Creat: 0.8 mg/dL (ref 0.50–1.10)
GFR, EST NON AFRICAN AMERICAN: 87 mL/min (ref >=60)
GFR, Est African American: >89 mL/min (ref >=60)
GLUCOSE: 114 mg/dL — AB (ref 70–99)
POTASSIUM: 4.7 mmol/L (ref 3.5–5.3)
SODIUM: 141 mmol/L (ref 135–146)
Total Bilirubin: 0.4 mg/dL (ref 0.2–1.2)
Total Protein: 6.9 g/dL (ref 6.1–8.1)

## 2016-03-10 LAB — CBC WITH DIFFERENTIAL/PLATELET
BASOS PCT: 1 %
Basophils Absolute: 130 cells/uL (ref 0–200)
EOS PCT: 3 %
Eosinophils Absolute: 390 cells/uL (ref 15–500)
HEMATOCRIT: 44.8 % (ref 35.0–45.0)
Hemoglobin: 15.1 g/dL — ABNORMAL HIGH (ref 12.0–15.0)
LYMPHS PCT: 31 %
Lymphs Abs: 4030 cells/uL — ABNORMAL HIGH (ref 850–3900)
MCH: 30.3 pg (ref 27.0–33.0)
MCHC: 33.7 g/dL (ref 32.0–36.0)
MCV: 90 fL (ref 80.0–100.0)
MONO ABS: 650 {cells}/uL (ref 200–950)
MONOS PCT: 5 %
MPV: 10 fL (ref 7.5–12.5)
NEUTROS PCT: 60 %
Neutro Abs: 7800 cells/uL (ref 1500–7800)
PLATELETS: 297 10*3/uL (ref 140–400)
RBC: 4.98 MIL/uL (ref 3.80–5.10)
RDW: 13.7 % (ref 11.0–15.0)
WBC: 13 10*3/uL — AB (ref 3.8–10.8)

## 2016-03-10 LAB — LIPID PANEL
CHOL/HDL RATIO: 3.5 ratio (ref <=5.0)
Cholesterol: 159 mg/dL (ref 125–200)
HDL: 45 mg/dL — AB (ref >=46)
LDL Cholesterol: 89 mg/dL (ref <130)
Triglycerides: 125 mg/dL (ref <150)
VLDL: 25 mg/dL (ref <30)

## 2016-03-12 ENCOUNTER — Ambulatory Visit (INDEPENDENT_AMBULATORY_CARE_PROVIDER_SITE_OTHER): Payer: BLUE CROSS/BLUE SHIELD | Admitting: Physician Assistant

## 2016-03-12 ENCOUNTER — Encounter: Payer: Self-pay | Admitting: Physician Assistant

## 2016-03-12 VITALS — BP 132/82 | HR 103 | Temp 97.8°F | Resp 18 | Wt 164.0 lb

## 2016-03-12 DIAGNOSIS — G250 Essential tremor: Secondary | ICD-10-CM | POA: Diagnosis not present

## 2016-03-12 DIAGNOSIS — G43009 Migraine without aura, not intractable, without status migrainosus: Secondary | ICD-10-CM

## 2016-03-12 DIAGNOSIS — N951 Menopausal and female climacteric states: Secondary | ICD-10-CM

## 2016-03-12 DIAGNOSIS — E785 Hyperlipidemia, unspecified: Secondary | ICD-10-CM

## 2016-03-12 DIAGNOSIS — Z8774 Personal history of (corrected) congenital malformations of heart and circulatory system: Secondary | ICD-10-CM | POA: Diagnosis not present

## 2016-03-12 DIAGNOSIS — Z23 Encounter for immunization: Secondary | ICD-10-CM | POA: Diagnosis not present

## 2016-03-12 DIAGNOSIS — K219 Gastro-esophageal reflux disease without esophagitis: Secondary | ICD-10-CM | POA: Diagnosis not present

## 2016-03-12 DIAGNOSIS — Z720 Tobacco use: Secondary | ICD-10-CM

## 2016-03-12 DIAGNOSIS — Z9889 Other specified postprocedural states: Secondary | ICD-10-CM | POA: Diagnosis not present

## 2016-03-12 DIAGNOSIS — F17200 Nicotine dependence, unspecified, uncomplicated: Secondary | ICD-10-CM

## 2016-03-12 MED ORDER — HYDROXYZINE HCL 50 MG PO TABS: 50.0000 mg | ORAL_TABLET | Freq: Three times a day (TID) | ORAL | 0 refills | Status: DC | PRN

## 2016-03-12 MED ORDER — PROPRANOLOL HCL 10 MG PO TABS: 10.0000 mg | ORAL_TABLET | Freq: Two times a day (BID) | ORAL | 3 refills | Status: DC

## 2016-03-12 NOTE — Addendum Note (Signed)
Addended by: Vonna Kotyk A on: 03/12/2016 01:08 PM   Modules accepted: Orders

## 2016-03-12 NOTE — Progress Notes (Signed)
Patient ID: ARLY BLEE MRN: [id number], DOB: [birth year], 50 y.o. Date of Encounter: 03/12/2016,   Chief Complaint: Routine f/u OV.  HPI: 50 y.o. y/o white female  here for routine f/u OV.   THE FOLLOWING IS COPIED FROM HER CPE NOTE WITH ME 08/14/2013:   She just had an office visit with me 08/07/13 secondary to recent onset of hot flashes. The following is copied from that office visit note:  " This just started all of a sudden in the past 3 weeks. But says that she is having severe hot flashes. Says at night she is waking up throughout the night secondary to her clothes and bedding making her so hot. Says multiple nights she ends up having to take her clothes off during the night because she is in a sweat. She keeps a cup of ice water by her bed to drink through the night. Has even had to get up and take a shower in the middle of the night. Is getting decreased sleep secondary to this. She also has problems with this during the day all day long.  Reports that she had a uterine ablation around June 2012. This was performed secondary to heavy bleeding. Has had no menstrual bleeding since the ablation. Therefore has no idea whether she's gone through menopause regarding bleeding cycles.  She has noticed no significant weight change, no palpitation, no significant change in hair or skin.  Has had no fever and no cough."  At that visit we discussed:  (Below is copied from that note dated 08/07/13): Discussed with her that we will check labs--FSH, LH, TSH.  If TSH is normal but her [place] and LH do indicate menopause, then we could discuss options for treating hot flashes.  Today I went ahead and discussed with her the options so that we can begin that conversation.  Option 1: HRT  Discussed that risks include:  A. risk of blood clot/CAD/CVD  ---Smoker; discussed that this would increase this risk and that she really would need smoking cessation for [place] to further discuss this option  ---History  states h/o 'blood clot arm" and h/o TIA  However, she reports that this all happened when she had a tear in her left bicep tendon. States that this injury occurred but she continued to work and therefore developed a lot of swelling in the arm and with this compression this is what caused the blood clot in the arm. States that she has had to have multiple surgeries to that left arm since then.  Also, as it turns out, she ended up having a PF O. which has been closed.  This was the cause of the TIA. Underlying cause has been fixed.  --She has no other personal history of CAD or PVD.  --No family history of premature CAD or PVD  B. discussed that hormone replacement therapy does not actually cause breast or uterine cancer but can speed up their growth if cancer Present.  She reports that she does have a history of cervical dysplasia and had what sounds like a LEEP procedure done to this. However had no Cervical Cancer.  No h/o uterine cancer or breast cancer or ovarian cancer.  She has no family history of any of these GYN cancers  She is going to schedule a complete physical exam so that we can perform breast exam pelvic exam and make sure her mammogram is up-to-date  Option 2: Venlafaxine or other SSRI.  She is to schedule for  complete physical exam in the next one to 2 weeks and we will further discuss treatment of hot flashes at that visit if the labs are consistent with hot flashes.    she now presents for followup. She says that she has thought things over and is concerned about starting hormone replacement therapy given her risk factor profile. Does want to start the venlafaxine. Otherwise, she has no new complaints today. Just here to get her physical/preventive care caught up.  AT THAT OV 08/14/2013 THE ASSESSMENT/PLAN WAS:  2. Hot flashes, menopausal It is noted that she has -triptan that she can use as needed for migraines but she rarely uses these. Aware of risk of possible serotonin  syndrome. However risk associated with HRT or even higher for her so she accepts this possible risk and wants to proceed with the venlafaxine. As well we will be using low dose. - venlafaxine XR (EFFEXOR XR) 37.5 MG 24 hr capsule; Take 1 capsule (37.5 mg total) by mouth daily with breakfast.  Dispense: 30 capsule; Refill: 5  3. Smoker Discussed need for cessation. Offered medications to help with cessation. She defers.  4. Migraine Rarely needs -triptan. Migraines have been very minimal.    OV--04/02/2014: Today she reports that the Effexor is working great. She says that after being on that medicine for just a couple weeks her hot flashes stopped. She is having absolutely no hot flashes anymore. She is having no adverse effects from the medicine.  Today I also reviewed that 08/14/13 labs included an LDL of 207 and I had stated to start Lipitor 80 mg. However, today patient states that she does not know anything about that and never started the Lipitor.  States that she still is not having migraines frequently but says that when she does the Relpax works very well for her.   Says that "the Effexor and Relpax are Gold to Her !! "   OV/CPE---02/28/2015: Says that the Effexor is still working well for her but says that it does seem like her body has gotten used to it and is not quite as effective as it was in the past. Is having some minor hot flashes--but not nearly as bad as they were prior to treatment.  Is wondering if dose can be increased. Says that she will sometimes have the beginning of a migraine but will take just a half of a Relpax and it will resolve. No concerns to address today.  OV 09/05/2015: She is taking the Lipitor 80 mg as directed. No myalgias or other adverse effects. She is taking the effexor. This continues to work well for her and is causing no adverse effects. The Relpax continues to work well for her migraines. She has no specific complaints or concerns today.  Frustrated with weight gain.  Says that she would try to quit smoking but is afraid she would weigh 300 pounds if she quit and also says that this is the one thing that she does that she enjoys.  OV 03/12/2016: She already came for her fasting labs a couple days ago. Reviewed those results. LDL is at goal at 89 on her Lipitor. Her labs are stable/normal. She is taking the Lipitor as directed. No myalgias or other adverse effects. She is taking the Effexor. This continues to work well for her and is causing no adverse effects. The Relpax continues to work well for her migraines. Today she does report that she has been having a couple of new symptoms recently. Says  she has noticed a tremor in her hands at times. Says it occurs if she is trying to hold her coffee cup or with writing. She also states that she has been having itching on the back of her neck on her back and on her chest and abdomen. The itching is only in these areas. Says that she has seen no type of skin lesions or rash. Even had her husband look at the back of her neck at the times that that is really itchy. She states that her husband has no itching. She states that they have not changed detergents soaps lotions etc. She does note that she still has in this spinal cord stimulator that was put in many years ago. She says that it is and even turned on anymore. She is wondering whether she should have that removed. However says that in the past when she talked to someone about having it removed they told her that any new doctor would be hesitant to be the one to remove it in case it was any complication and it really should be removed by the doctor that inserted it. However at this time she has no idea even the name of the doctor or the practice that inserted it. She says that she will check her records at home and will follow-up with me if needed. No other complaints or concerns today.  Past Medical History:  Diagnosis Date  . Complication  of anesthesia   . Embolism - blood clot    arm  . GERD (gastroesophageal reflux disease)   . HLD (hyperlipidemia)   . Migraine   . PONV (postoperative nausea and vomiting)   . Smoker   . Stroke [facility])    TIA     Past Surgical History:  Procedure Laterality Date  . CHOLECYSTECTOMY    . COLONOSCOPY  08/04/2005   [id number] external hemorrhoids and anal papilla, otherwise normal  . COLONOSCOPY N/A 10/07/2012   Procedure: COLONOSCOPY;  Surgeon: Daneil Dolin, MD;  Location: [facility];  Service: Endoscopy;  Laterality: N/A;  10:30  . ESOPHAGOGASTRODUODENOSCOPY  04/20/2003   [id number] changes of reflux esophagitis/Limited gastroesophageal junction, otherwise normal  . ESOPHAGOGASTRODUODENOSCOPY N/A 10/07/2012   Procedure: ESOPHAGOGASTRODUODENOSCOPY (EGD);  Surgeon: Daneil Dolin, MD;  Location: [facility];  Service: Endoscopy;  Laterality: N/A;  . PATENT FORAMEN OVALE CLOSURE    . SHOULDER ARTHROSCOPY     left  . SPINAL CORD STIMULATOR IMPLANT    . TUBAL LIGATION      Home Meds:  Outpatient Medications Prior to Visit  Medication Sig Dispense Refill  . atorvastatin (LIPITOR) 80 MG tablet TAKE ONE TABLET BY MOUTH ONCE DAILY . 90 tablet 1  . RELPAX 40 MG tablet TAKE ONE TABLET BY MOUTH ONCE DAILY AS NEEDED FOR MIGRAINE. MAY REPEAT IN 2 HOURS IF NEEDED. 12 tablet 1  . venlafaxine XR (EFFEXOR XR) 75 MG 24 hr capsule Take 1 capsule (75 mg total) by mouth daily with breakfast. 90 capsule 1  . cyclobenzaprine (FLEXERIL) 5 MG tablet Take 5 mg by mouth 3 (three) times daily as needed for muscle spasms.    . fluticasone (FLONASE) 50 MCG/ACT nasal spray Place 2 sprays into both nostrils daily. (Patient not taking: Reported on 03/12/2016) 16 g 6  . naproxen sodium (ANAPROX DS) 550 MG tablet Take 550 mg by mouth 2 (two) times daily with a meal.    . traMADol (ULTRAM) 50 MG tablet Take by mouth 4 (four) times daily  as needed.     No facility-administered medications prior to visit.       Allergies:  Allergies  Allergen Reactions  . Aspirin Other (See Comments)    G.I. Upset   . Demerol [Meperidine]     Gi upset,   . Morphine And Related Other (See Comments)    G.I. Upset  . Latex Itching and Rash    Social History   Social History  . Marital status: Married    Spouse name: N/A  . Number of children: N/A  . Years of education: N/A   Occupational History  . Clinical biochemist   Social History Main Topics  . Smoking status: Current Every Day Smoker    Packs/day: 0.50    Years: 30.00    Types: Cigarettes  . Smokeless tobacco: Never Used  . Alcohol use No  . Drug use: No  . Sexual activity: Yes   Other Topics Concern  . Not on file   Social History Narrative   Today patient informed me that her father, who is actually a patient of mine-- named  Chief of Staff-- I had no idea of this until today!!   She says that he actually lives with them now.   She also lives with her husband and her 25 year old son.   Says she also has one other son who is 84 and married with a child.      She works as a Pharmacist, hospital at [employer].   Does no exercise.   Has smoked about 1/2 ppd since age 84 years old-- now this has been for 30 years.(as of 2015)   I did not know until today when she told me that her father is actually a patient of mine, named Chief of Staff.!! Patient states that her father actually lives with her. As well she lives with her husband and her son who is 69. Says she has another son who is 56 years old and who is married with a child. 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 Does no exercise.  Family History  Problem Relation Age of Onset  . Cancer Mother     unsure primary, deceased at age 49  . Diabetes    . Colon cancer Neg Hx   . Diabetes Father   . Diabetes Brother   . Diabetes Brother   . Diabetes Brother   . Diabetes Brother   . Diabetes Brother     Physical  Exam: Blood pressure 132/82, pulse (!) 103, temperature 97.8 F (36.6 C), temperature source Oral, resp. rate 18, weight 164 lb (74.4 kg)., Body mass index is 30.99 kg/m. General: Well developed, well nourished,WF. Appears in no acute distress. Neck: Supple. Trachea midline. No thyromegaly. Full ROM. No lymphadenopathy.No Carotid Bruits. Lungs: Clear to auscultation bilaterally without wheezes, rales, or rhonchi. Breathing is of normal effort and unlabored. Cardiovascular: RRR with S1 S2. No murmurs, rubs, or gallops. Distal pulses 2+ symmetrically. No carotid or abdominal bruits. Abdomen: Soft, non-tender, non-distended with normoactive bowel sounds. No hepatosplenomegaly or masses. No rebound/guarding. No CVA tenderness. No hernias.  Musculoskeletal: Full range of motion and 5/5 strength throughout.  Skin: Warm and moist. I inspected the skin on the back of her neck on her back on her chest on her abdomen on her arms. I see no skin lesions or rash. Neuro: A+Ox3. CN II-XII grossly intact. Moves all extremities spontaneously.  Psych:  Responds to questions appropriately with a  normal affect.   Assessment/Plan:  50 y.o. y/o female here for   Essential tremor Will add propranolol to see if this controls her tremors - propranolol (INDERAL) 10 MG tablet; Take 1 tablet (10 mg total) by mouth 2 (two) times daily.  Dispense: 60 tablet; Refill: 3  Itching- --sent Rx for Atarax to use prn    1. HLD (hyperlipidemia) She is on Lipitor 80 mg.  FLP is at goal. LFTs normal. Continue Lipitor 80.  2. Hot flashes, menopausal At OV 02/28/2015--- She reported that her hot flashes are still much improved but seem to have increased somewhat recently and not be completely controlled as they had been in the past therefore dose of venlafaxine increased from 37.5 to 75 mg --  3. Migraine without aura and without status migrainosus, not intractable Relpax works very well for her. She takes only a half of a  pill and migraine resolves.  4. Smoker Discussed need for cessation. Offered medications to help with cessation. She defers.  Preventive Health Examination--THE FOLLOWING IS COPIED FROM OV NOTE 02/2015:  A. Screening Labs: She is fasting. Check CBC CMET FLP TSH vitamin D.  B. Pap: She had a Pap smear here with me on 07/30/2011. Cytology was negative. HPV cotesting was performed and not detected. Therefore she can wait 5 years today repeat Pap smear.  C. Screening Mammogram: She had mammograms 12/26/2014. Negative.  D. DEXA/BMD: Can wait to perform this as she is just recently menopausal.  E. Colorectal Cancer Screening: She is only 50 years old and has no indication to start screening colonoscopies prior to age 68.  However because of some symptoms she was having she has already seen GI and had a colonoscopy with them  10/07/12.   I was able to find a colonoscopy report in epic but could not find the pathology report to determine when repeat colonoscopy due. Colonoscopy was performed 10/07/2012 by Dr. Buford Dresser. She did have polyps biopsy. However pathology report unknown to me. Will have to find out these results and whether she is supposed to repeat colonoscopy in 3 years or 5 years. If 3 years, then it  is due 10/08/2015.  F. Immunizations:  Influenza vaccine--- She is agreeable to receive this today -- Given here 02/28/2015 Tetanus: Tdap given here 08/14/2013 Pneumococcal: Given her smoking she needs a Pneumovax 23. Pneumonia vaccine given here 08/14/2013. No further pneumonia vaccine indicated until age 65. Zostavax: Wait until age 51 to discuss  Routine office visit and labs in 6 months or sooner if needed.    Signed, [address] [place], [place], [facility] 03/12/2016 9:53 AM

## 2016-03-23 ENCOUNTER — Other Ambulatory Visit: Payer: Self-pay | Admitting: Physician Assistant

## 2016-03-23 DIAGNOSIS — N951 Menopausal and female climacteric states: Secondary | ICD-10-CM

## 2016-03-23 DIAGNOSIS — E785 Hyperlipidemia, unspecified: Secondary | ICD-10-CM

## 2016-03-24 NOTE — Telephone Encounter (Signed)
Medication refilled per protocol. 

## 2016-04-30 ENCOUNTER — Other Ambulatory Visit: Payer: Self-pay | Admitting: Physician Assistant

## 2016-04-30 NOTE — Telephone Encounter (Signed)
Rx refilled per protocol 

## 2016-07-16 ENCOUNTER — Encounter: Payer: Self-pay | Admitting: Physician Assistant

## 2016-07-16 ENCOUNTER — Ambulatory Visit (INDEPENDENT_AMBULATORY_CARE_PROVIDER_SITE_OTHER): Payer: BLUE CROSS/BLUE SHIELD | Admitting: Physician Assistant

## 2016-07-16 VITALS — BP 130/82 | HR 86 | Temp 98.4°F | Resp 16 | Wt 170.2 lb

## 2016-07-16 DIAGNOSIS — R110 Nausea: Secondary | ICD-10-CM | POA: Diagnosis not present

## 2016-07-16 DIAGNOSIS — R1013 Epigastric pain: Secondary | ICD-10-CM

## 2016-07-16 DIAGNOSIS — R635 Abnormal weight gain: Secondary | ICD-10-CM

## 2016-07-16 MED ORDER — OMEPRAZOLE 20 MG PO CPDR: 20.0000 mg | DELAYED_RELEASE_CAPSULE | Freq: Every day | ORAL | 3 refills | Status: DC

## 2016-07-16 NOTE — Progress Notes (Signed)
Patient ID: Jessica Shaw MRN: [id number], DOB: 05/25/1966, 51 y.o. Date of Encounter: @DATE @  Chief Complaint:  Chief Complaint  Patient presents with  . Abdominal Pain    x 2wk  . Nausea    HPI: 51 y.o. year old female  presents with above.   She reports that for the last couple of weeks every morning she feels very nauseous and every evening she feels very nauseous. She has had no vomiting. She has had some mild discomfort in her epigastric region. Has had no diarrhea. No belching or burping. Says that she has noticed some increased "gas"/flatulence.  Says that she has been taking Tums. Says that she did have ulcers when she had an endoscopy sometime a little over a year ago.  Does not think she could be pregnant because she has had a tubal ligation and ablation. Is 51 years old.  Also says that she keeps gaining weight. However says that she is eating very little and eating healthy foods said is not sure why she is gaining weight.  We discussed that her father recently passed away--- was a patient of mine--- discussed the circumstances of his death. She says that she has been sad since his death, but doesn't know that that has caused enough stress to be the cause of her current symptoms. Does not think she is eating more/worse secondary to stress/depression related to his death--to be cause of weight gain.   Past Medical History:  Diagnosis Date  . Complication of anesthesia   . Embolism - blood clot    arm  . GERD (gastroesophageal reflux disease)   . HLD (hyperlipidemia)   . Migraine   . PONV (postoperative nausea and vomiting)   . Smoker   . Stroke [facility])    TIA     Home Meds: Outpatient Medications Prior to Visit  Medication Sig Dispense Refill  . atorvastatin (LIPITOR) 80 MG tablet TAKE ONE TABLET BY MOUTH ONCE DAILY 90 tablet 1  . propranolol (INDERAL) 10 MG tablet Take 1 tablet (10 mg total) by mouth 2 (two) times daily. 60 tablet 3  . RELPAX 40 MG  tablet TAKE ONE TABLET BY MOUTH ONCE DAILY AS NEEDED FOR MIGRAINE. MAY REPEAT IN 2 HOURS IF NEEDED 12 tablet 3  . venlafaxine XR (EFFEXOR-XR) 75 MG 24 hr capsule TAKE ONE CAPSULE BY MOUTH ONCE DAILY WITH BREAKFAST 90 capsule 1  . hydrOXYzine (ATARAX/VISTARIL) 50 MG tablet Take 1 tablet (50 mg total) by mouth 3 (three) times daily as needed. (Patient not taking: Reported on 07/16/2016) 30 tablet 0   No facility-administered medications prior to visit.     Allergies:  Allergies  Allergen Reactions  . Aspirin Other (See Comments)    G.I. Upset   . Demerol [Meperidine]     Gi upset,   . Morphine And Related Other (See Comments)    G.I. Upset  . Latex Itching and Rash    Social History   Social History  . Marital status: Married    Spouse name: N/A  . Number of children: N/A  . Years of education: N/A   Occupational History  . Clinical biochemist   Social History Main Topics  . Smoking status: Current Every Day Smoker    Packs/day: 0.50    Years: 30.00    Types: Cigarettes  . Smokeless tobacco: Never Used  . Alcohol use No  . Drug use: No  . Sexual activity: Yes   Other Topics Concern  .  Not on file   Social History Narrative   Today patient informed me that her father, who is actually a patient of mine-- named  Chief of Staff-- I had no idea of this until today!!   She says that he actually lives with them now.   She also lives with her husband and her 38 year old son.   Says she also has one other son who is 36 and married with a child.      She works as a Pharmacist, hospital at [employer].   Does no exercise.   Has smoked about 1/2 ppd since age 24 years old-- now this has been for 30 years.(as of 2015)    Family History  Problem Relation Age of Onset  . Cancer Mother     unsure primary, deceased at age 71  . Diabetes    . Colon cancer Neg Hx   . Diabetes Father   . Diabetes Brother   . Diabetes Brother   . Diabetes Brother     . Diabetes Brother   . Diabetes Brother      Review of Systems:  See HPI for pertinent ROS. All other ROS negative.    Physical Exam: Blood pressure 130/82, pulse 86, temperature 98.4 F (36.9 C), temperature source Oral, resp. rate 16, weight 170 lb 3.2 oz (77.2 kg), SpO2 98 %., Body mass index is 32.16 kg/m. General: WNWD WF. Appears in no acute distress. Neck: Supple. No thyromegaly. No lymphadenopathy. Lungs: Clear bilaterally to auscultation without wheezes, rales, or rhonchi. Breathing is unlabored. Heart: RRR with S1 S2. No murmurs, rubs, or gallops. Abdomen: Soft,  non-distended with normoactive bowel sounds. No hepatomegaly. No rebound/guarding. No obvious abdominal masses. Mild tenderness with palpation of epigastric region. Musculoskeletal:  Strength and tone normal for age. Extremities/Skin: Warm and dry.  Neuro: Alert and oriented X 3. Moves all extremities spontaneously. Gait is normal. CNII-XII grossly in tact. Psych:  Responds to questions appropriately with a normal affect.     ASSESSMENT AND PLAN:  51 y.o. year old female with  1. Nausea - Amylase - H. pylori breath test - Lipase - CBC with Differential/Platelet - COMPLETE METABOLIC PANEL WITH GFR - hCG, serum, qualitative - TSH  2. Epigastric pain - Amylase - H. pylori breath test - Lipase - CBC with Differential/Platelet - COMPLETE METABOLIC PANEL WITH GFR - hCG, serum, qualitative - omeprazole (PRILOSEC) 20 MG capsule; Take 1 capsule (20 mg total) by mouth daily.  Dispense: 30 capsule; Refill: 3  3. Weight gain - hCG, serum, qualitative - TSH Will check labs.  She is to start omeprazole daily.  Will follow-up with her when get lab results. If labs are non-diagnostic then would recommend follow-up with GI to make sure she does not have recurrent  ulcer.  Marin Olp [place], [place], [facility] 07/16/2016 3:08 PM

## 2016-07-17 LAB — COMPLETE METABOLIC PANEL WITH GFR
ALBUMIN: 4.6 g/dL (ref 3.6–5.1)
ALK PHOS: 66 U/L (ref 33–115)
ALT: 33 U/L — AB (ref 6–29)
AST: 21 U/L (ref 10–35)
BILIRUBIN TOTAL: 0.5 mg/dL (ref 0.2–1.2)
BUN: 11 mg/dL (ref 7–25)
CO2: 27 mmol/L (ref 20–31)
CREATININE: 0.97 mg/dL (ref 0.50–1.10)
Calcium: 9.6 mg/dL (ref 8.6–10.2)
Chloride: 105 mmol/L (ref 98–110)
GFR, Est African American: 79 mL/min (ref >=60)
GFR, Est Non African American: 69 mL/min (ref >=60)
GLUCOSE: 83 mg/dL (ref 70–99)
Potassium: 4.2 mmol/L (ref 3.5–5.3)
SODIUM: 140 mmol/L (ref 135–146)
TOTAL PROTEIN: 7.1 g/dL (ref 6.1–8.1)

## 2016-07-17 LAB — CBC WITH DIFFERENTIAL/PLATELET
BASOS ABS: 117 {cells}/uL (ref 0–200)
Basophils Relative: 1 %
Eosinophils Absolute: 234 cells/uL (ref 15–500)
Eosinophils Relative: 2 %
HCT: 45.4 % — ABNORMAL HIGH (ref 35.0–45.0)
Hemoglobin: 15.5 g/dL — ABNORMAL HIGH (ref 12.0–15.0)
Lymphocytes Relative: 37 %
Lymphs Abs: 4329 cells/uL — ABNORMAL HIGH (ref 850–3900)
MCH: 30.6 pg (ref 27.0–33.0)
MCHC: 34.1 g/dL (ref 32.0–36.0)
MCV: 89.7 fL (ref 80.0–100.0)
MONOS PCT: 6 %
MPV: 9.8 fL (ref 7.5–12.5)
Monocytes Absolute: 702 cells/uL (ref 200–950)
NEUTROS ABS: 6318 {cells}/uL (ref 1500–7800)
NEUTROS PCT: 54 %
PLATELETS: 332 10*3/uL (ref 140–400)
RBC: 5.06 MIL/uL (ref 3.80–5.10)
RDW: 13.8 % (ref 11.0–15.0)
WBC: 11.7 10*3/uL — ABNORMAL HIGH (ref 3.8–10.8)

## 2016-07-17 LAB — AMYLASE: AMYLASE: 33 U/L (ref 0–105)

## 2016-07-17 LAB — HCG, SERUM, QUALITATIVE: Preg, Serum: NEGATIVE

## 2016-07-17 LAB — H. PYLORI BREATH TEST: H. PYLORI BREATH TEST: NOT DETECTED

## 2016-07-17 LAB — TSH: TSH: 0.92 mIU/L

## 2016-07-17 LAB — LIPASE: Lipase: 14 U/L (ref 7–60)

## 2016-07-24 ENCOUNTER — Other Ambulatory Visit: Payer: Self-pay

## 2016-07-24 DIAGNOSIS — K219 Gastro-esophageal reflux disease without esophagitis: Secondary | ICD-10-CM

## 2016-07-27 ENCOUNTER — Encounter: Payer: Self-pay | Admitting: Internal Medicine

## 2016-08-12 ENCOUNTER — Ambulatory Visit: Payer: BLUE CROSS/BLUE SHIELD | Admitting: Gastroenterology

## 2016-09-01 ENCOUNTER — Other Ambulatory Visit: Payer: Self-pay

## 2016-09-01 ENCOUNTER — Encounter: Payer: Self-pay | Admitting: Internal Medicine

## 2016-09-01 ENCOUNTER — Ambulatory Visit (INDEPENDENT_AMBULATORY_CARE_PROVIDER_SITE_OTHER): Payer: BLUE CROSS/BLUE SHIELD | Admitting: Internal Medicine

## 2016-09-01 VITALS — BP 122/83 | HR 91 | Temp 97.8°F | Ht 62.0 in | Wt 172.6 lb

## 2016-09-01 DIAGNOSIS — R194 Change in bowel habit: Secondary | ICD-10-CM | POA: Diagnosis not present

## 2016-09-01 DIAGNOSIS — K219 Gastro-esophageal reflux disease without esophagitis: Secondary | ICD-10-CM

## 2016-09-01 DIAGNOSIS — R195 Other fecal abnormalities: Secondary | ICD-10-CM

## 2016-09-01 MED ORDER — PEG 3350-KCL-NA BICARB-NACL 420 G PO SOLR: 4000.0000 mL | ORAL | 0 refills | Status: DC

## 2016-09-01 NOTE — Patient Instructions (Signed)
GERD information provided  Stop omeprazole for now; trial of Dexilant 60 mg daily 2 weeks-samples provided  Plan to lose 20 pounds between now the end of the year  We are going to schedule a diagnostic colonoscopy for change in bowel habits and rectal pain; history of a colonic adenoma. Conscious sedation.  Further recommendations to follow.

## 2016-09-01 NOTE — Progress Notes (Signed)
Primary Care Physician:  Karis Juba, PA-C Primary Gastroenterologist:  Dr. Gala Romney  Pre-Procedure History & Physical: HPI:  Jessica Shaw is a 51 y.o. female here for evaluation of a worsening GERD symptoms. Patient noted significant weight gain since she was last seen her a few years ago. She's gained 20 pounds by her records since 2014. Came off of acid suppression therapy completely for a while. Started back on omeprazole 20 mg recently. Continues to have reflux symptoms at least 3 times weekly particularly at night. No dysphagia or odynophagia. No early satiety, nausea or vomiting. She continues to smoke. History of colonic adenoma removed for: 2014-due for surveillance 08/05/2019.  Patient notes intermittent fleeting, severe rectal pain consistent with her prior diagnosis of proctalgia fugax.  She also notes for over a month now change in caliber of her stool;  Passing a skinny stool with each bowel movement. Denies this is transient. Every stool for the past month has been this way. Denies diarrhea. Again, no melena or rectal bleeding. Past Medical History:  Diagnosis Date  . Complication of anesthesia   . Embolism - blood clot    arm  . GERD (gastroesophageal reflux disease)   . HLD (hyperlipidemia)   . Migraine   . PONV (postoperative nausea and vomiting)   . Smoker   . Stroke [facility])    TIA    Past Surgical History:  Procedure Laterality Date  . CHOLECYSTECTOMY    . COLONOSCOPY  08/04/2005   IRC:VELFY external hemorrhoids and anal papilla, otherwise normal  . COLONOSCOPY N/A 10/07/2012   Procedure: COLONOSCOPY;  Surgeon: Daneil Dolin, MD;  Location: [facility];  Service: Endoscopy;  Laterality: N/A;  10:30  . ESOPHAGOGASTRODUODENOSCOPY  04/20/2003   BOF:BPZW changes of reflux esophagitis/Limited gastroesophageal junction, otherwise normal  . ESOPHAGOGASTRODUODENOSCOPY N/A 10/07/2012   Procedure: ESOPHAGOGASTRODUODENOSCOPY (EGD);  Surgeon: Daneil Dolin, MD;   Location: [facility];  Service: Endoscopy;  Laterality: N/A;  . PATENT FORAMEN OVALE CLOSURE    . SHOULDER ARTHROSCOPY     left  . SPINAL CORD STIMULATOR IMPLANT    . TUBAL LIGATION      Prior to Admission medications   Medication Sig Start Date End Date Taking? Authorizing Provider  atorvastatin (LIPITOR) 80 MG tablet TAKE ONE TABLET BY MOUTH ONCE DAILY 03/24/16  Yes Orlena Sheldon, PA-C  omeprazole (PRILOSEC) 20 MG capsule Take 1 capsule (20 mg total) by mouth daily. 07/16/16  Yes Orlena Sheldon, PA-C  propranolol (INDERAL) 10 MG tablet Take 1 tablet (10 mg total) by mouth 2 (two) times daily. 03/12/16  Yes Mary B Dixon, PA-C  RELPAX 40 MG tablet TAKE ONE TABLET BY MOUTH ONCE DAILY AS NEEDED FOR MIGRAINE. MAY REPEAT IN 2 HOURS IF NEEDED 04/30/16  Yes Orlena Sheldon, PA-C  venlafaxine XR (EFFEXOR-XR) 75 MG 24 hr capsule TAKE ONE CAPSULE BY MOUTH ONCE DAILY WITH BREAKFAST 03/24/16  Yes Orlena Sheldon, PA-C  hydrOXYzine (ATARAX/VISTARIL) 50 MG tablet Take 1 tablet (50 mg total) by mouth 3 (three) times daily as needed. Patient not taking: Reported on 07/16/2016 03/12/16   Orlena Sheldon, PA-C    Allergies as of 09/01/2016 - Review Complete 09/01/2016  Allergen Reaction Noted  . Aspirin Other (See Comments) 10/16/2010  . Demerol [meperidine]  11/12/2013  . Morphine and related Other (See Comments) 10/16/2010  . Latex Itching and Rash 10/16/2010    Family History  Problem Relation Age of Onset  . Cancer Mother  unsure primary, deceased at age 50  . Diabetes    . Colon cancer Neg Hx   . Diabetes Father   . Diabetes Brother   . Diabetes Brother   . Diabetes Brother   . Diabetes Brother   . Diabetes Brother     Social History   Social History  . Marital status: Married    Spouse name: N/A  . Number of children: N/A  . Years of education: N/A   Occupational History  . Clinical biochemist   Social History Main Topics  . Smoking status: Current Every Day Smoker     Packs/day: 0.50    Years: 30.00    Types: Cigarettes  . Smokeless tobacco: Never Used  . Alcohol use No  . Drug use: No  . Sexual activity: Yes   Other Topics Concern  . Not on file   Social History Narrative   Today patient informed me that her father, who is actually a patient of mine-- named  Chief of Staff-- I had no idea of this until today!!   She says that he actually lives with them now.   She also lives with her husband and her 88 year old son.   Says she also has one other son who is 14 and married with a child.      She works as a Pharmacist, hospital at [employer].   Does no exercise.   Has smoked about 1/2 ppd since age 77 years old-- now this has been for 30 years.(as of 2015)    Review of Systems: See HPI, otherwise negative ROS  Physical Exam: BP 122/83   Pulse 91   Temp 97.8 F (36.6 C) (Oral)   Ht 5\' 2"  (1.575 m)   Wt 172 lb 9.6 oz (78.3 kg)   BMI 31.57 kg/m  General:   Alert,  Well-developed, well-nourished, pleasant and cooperative in NAD Skin:  Intact without significant lesions or rashes. Neck:  Supple; no masses or thyromegaly. No significant cervical adenopathy. Lungs:  Clear throughout to auscultation.   No wheezes, crackles, or rhonchi. No acute distress. Heart:  Regular rate and rhythm; no murmurs, clicks, rubs,  or gallops. Abdomen: Non-distended, normal bowel sounds.  Soft and nontender without appreciable mass or hepatosplenomegaly.  Pulses:  Normal pulses noted. Extremities:  Without clubbing or edema. Rectal: Good sphincter tone. I do not detect any palpable abnormalities. Scant brown stool Hemoccult negative  Impression:  Pleasant 51 year old lady with some refractory recurrent reflux symptoms in the setting of significant weight gain ongoing tobacco abuse in the way of smoking. Now, even back on omeprazole 20 mg daily she has frequent breakthrough symptoms. Etiology of her reflux symptoms likely multifactorial. No  alarm symptoms per se.  We discussed the multipronged approach to GERD.  As a separate issue,  a sustained change in caliber of stool the setting of intermittent severe lancinating rectal pain; history of colonic adenoma. In this setting, I do not think she needs to wait till 2021 to have her follow-up colonoscopy. I believe we need to go ahead and proceed with a diagnostic colonoscopy at this time. To this end, I have recommended we proceed a colonoscopy.The risks, benefits, limitations, alternatives and imponderables have been reviewed with the patient. Questions have been answered. All parties are agreeable.   GERD information provided  Stop omeprazole for now; trial of Dexilant 60 mg daily 2 weeks-samples provided  Plan to lose 20 pounds between now the end of the  year  Further recommendations to follow.       Notice: This dictation was prepared with Dragon dictation along with smaller phrase technology. Any transcriptional errors that result from this process are unintentional and may not be corrected upon review.

## 2016-09-02 ENCOUNTER — Other Ambulatory Visit: Payer: Self-pay

## 2016-09-02 DIAGNOSIS — Z86010 Personal history of colonic polyps: Secondary | ICD-10-CM

## 2016-09-02 DIAGNOSIS — R194 Change in bowel habit: Secondary | ICD-10-CM

## 2016-09-02 DIAGNOSIS — K6289 Other specified diseases of anus and rectum: Secondary | ICD-10-CM

## 2016-09-03 DIAGNOSIS — Z713 Dietary counseling and surveillance: Secondary | ICD-10-CM | POA: Diagnosis not present

## 2016-09-09 ENCOUNTER — Ambulatory Visit: Payer: BLUE CROSS/BLUE SHIELD | Admitting: Physician Assistant

## 2016-09-14 ENCOUNTER — Other Ambulatory Visit: Payer: Self-pay | Admitting: Physician Assistant

## 2016-09-14 NOTE — Telephone Encounter (Signed)
Refill appropriate 

## 2016-09-16 ENCOUNTER — Ambulatory Visit (INDEPENDENT_AMBULATORY_CARE_PROVIDER_SITE_OTHER): Payer: BLUE CROSS/BLUE SHIELD | Admitting: Physician Assistant

## 2016-09-16 ENCOUNTER — Encounter: Payer: Self-pay | Admitting: Physician Assistant

## 2016-09-16 VITALS — BP 110/83 | HR 98 | Temp 97.9°F | Resp 16 | Wt 173.8 lb

## 2016-09-16 DIAGNOSIS — F17200 Nicotine dependence, unspecified, uncomplicated: Secondary | ICD-10-CM

## 2016-09-16 DIAGNOSIS — G43009 Migraine without aura, not intractable, without status migrainosus: Secondary | ICD-10-CM | POA: Diagnosis not present

## 2016-09-16 DIAGNOSIS — E785 Hyperlipidemia, unspecified: Secondary | ICD-10-CM

## 2016-09-16 DIAGNOSIS — N951 Menopausal and female climacteric states: Secondary | ICD-10-CM

## 2016-09-16 NOTE — Progress Notes (Signed)
Patient ID: Jessica Shaw MRN: [id number], DOB: 01-Oct-1965, 51 y.o. Date of Encounter: @DATE @  Chief Complaint:  Chief Complaint  Patient presents with  . Hyperlipidemia    6 month f/u  . Tremors    HPI: 51 y.o. year old female  presents for routine OV.  Hot flashes, menopausal Reviewed my office notes regarding hot flashes on 08/07/13 and 08/14/13. At that time I had lengthy documentation regarding HRT given her risk factor profile. At that time prescribed Effexor which has been working well at controlling her hot flashes. It is noted that she has -triptan that she can use as needed for migraines but she rarely uses these. Aware of risk of possible serotonin syndrome. However risk associated with HRT or even higher for her so she accepts this possible risk and wants to proceed with the venlafaxine. As well we will be using low dose. Today reports the venlafaxine/Effexor still working great for her.  Smoker She is smoking 1ppd. In the past, and again today, I discussed need for cessation. Offered medications to help with cessation. She defers.  Migraine States thatthe Relpax still works very well for her. Does have to take one 3 -4 times a week but it works very well and aborts migraine. Says that "the Effexor and Relpax are Gold to Her !! "  Frustrated by weight gain despite healthy diet. No other concerns.    Past Medical History:  Diagnosis Date  . Complication of anesthesia   . Embolism - blood clot    arm  . GERD (gastroesophageal reflux disease)   . HLD (hyperlipidemia)   . Migraine   . PONV (postoperative nausea and vomiting)   . Smoker   . Stroke [facility])    TIA     Home Meds: Outpatient Medications Prior to Visit  Medication Sig Dispense Refill  . atorvastatin (LIPITOR) 80 MG tablet TAKE ONE TABLET BY MOUTH ONCE DAILY 90 tablet 1  . omeprazole (PRILOSEC) 20 MG capsule Take 1 capsule (20 mg total) by mouth daily. 30 capsule 3  . polyethylene  glycol-electrolytes (TRILYTE) 420 g solution Take 4,000 mLs by mouth as directed. 4000 mL 0  . propranolol (INDERAL) 10 MG tablet Take 1 tablet (10 mg total) by mouth 2 (two) times daily. 60 tablet 3  . RELPAX 40 MG tablet TAKE ONE TABLET BY MOUTH ONCE DAILY AS NEEDED FOR MIGRAINE. MAY REPEAT IN 2 HOURS IF NEEDED 12 tablet 3  . venlafaxine XR (EFFEXOR-XR) 75 MG 24 hr capsule TAKE ONE CAPSULE BY MOUTH ONCE DAILY WITH BREAKFAST 90 capsule 1  . hydrOXYzine (ATARAX/VISTARIL) 50 MG tablet Take 1 tablet (50 mg total) by mouth 3 (three) times daily as needed. (Patient not taking: Reported on 07/16/2016) 30 tablet 0  . RELPAX 40 MG tablet TAKE ONE TABLET BY MOUTH ONCE DAILY AS NEEDED FOR  MIGRAINE.  MAY  REPEAT  IN  2  HOURS  IF  NEEDED. 12 tablet 5   No facility-administered medications prior to visit.     Allergies:  Allergies  Allergen Reactions  . Aspirin Other (See Comments)    G.I. Upset   . Demerol [Meperidine]     Gi upset,   . Morphine And Related Other (See Comments)    G.I. Upset  . Latex Itching and Rash    Social History   Social History  . Marital status: Married    Spouse name: N/A  . Number of children: N/A  . Years of education:  N/A   Occupational History  . Clinical biochemist   Social History Main Topics  . Smoking status: Current Every Day Smoker    Packs/day: 0.50    Years: 30.00    Types: Cigarettes  . Smokeless tobacco: Never Used  . Alcohol use No  . Drug use: No  . Sexual activity: Yes   Other Topics Concern  . Not on file   Social History Narrative   Today patient informed me that her father, who is actually a patient of mine-- named  Chief of Staff-- I had no idea of this until today!!   She says that he actually lives with them now.   She also lives with her husband and her 80 year old son.   Says she also has one other son who is 18 and married with a child.      She works as a Pharmacist, hospital at [employer].     Does no exercise.   Has smoked about 1/2 ppd since age 68 years old-- now this has been for 30 years.(as of 2015)    Family History  Problem Relation Age of Onset  . Cancer Mother     unsure primary, deceased at age 60  . Diabetes    . Colon cancer Neg Hx   . Diabetes Father   . Diabetes Brother   . Diabetes Brother   . Diabetes Brother   . Diabetes Brother   . Diabetes Brother      Review of Systems:  See HPI for pertinent ROS. All other ROS negative.    Physical Exam: Temperature 98 F (36.7 C), temperature source Oral, weight 173 lb 12.8 oz (78.8 kg)., Body mass index is 31.79 kg/m. General: WNWD WF. Appears in no acute distress. Neck: Supple. No thyromegaly. No lymphadenopathy.No carotid bruits. Lungs: Clear bilaterally to auscultation without wheezes, rales, or rhonchi. Breathing is unlabored. Heart: RRR with S1 S2. No murmurs, rubs, or gallops. Abdomen: Soft, non-tender, non-distended with normoactive bowel sounds. No hepatomegaly. No rebound/guarding. No obvious abdominal masses. Musculoskeletal:  Strength and tone normal for age. Extremities/Skin: Warm and dry.  No LE edema.  Neuro: Alert and oriented X 3. Moves all extremities spontaneously. Gait is normal. CNII-XII grossly in tact. Psych:  Responds to questions appropriately with a normal affect.     ASSESSMENT AND PLAN:  51 y.o. year old female with   1. HLD (hyperlipidemia) She is on Lipitor 80 mg. She is not fasting. She had CMET 07/16/16--LFTs normal. Had FLP 02/2016---LDL-89 Can wait and repeat labs 6 mos--fasting  2. Hot flashes, menopausal Controlled--cont current dose venlafaxine  3. Migraine without aura and without status migrainosus, not intractable Relpax works very well for her. She takes only a half of a pill and migraine resolves.  4. Smoker Discussed need for cessation. Offered medications to help with cessation. She defers.  Discussed weight gain--TSH normal 07/16/2016---cont healthy  diet and exercise  ROV 6 mos--come fasting  Signed, [address] [place], [place], [facility] 09/16/2016 4:30 PM

## 2016-09-21 ENCOUNTER — Other Ambulatory Visit: Payer: Self-pay | Admitting: Physician Assistant

## 2016-09-21 DIAGNOSIS — G250 Essential tremor: Secondary | ICD-10-CM

## 2016-09-21 DIAGNOSIS — E785 Hyperlipidemia, unspecified: Secondary | ICD-10-CM

## 2016-09-21 DIAGNOSIS — N951 Menopausal and female climacteric states: Secondary | ICD-10-CM

## 2016-09-22 NOTE — Telephone Encounter (Signed)
Medication refill appropriate

## 2016-09-24 DIAGNOSIS — Z713 Dietary counseling and surveillance: Secondary | ICD-10-CM | POA: Diagnosis not present

## 2016-10-08 DIAGNOSIS — Z713 Dietary counseling and surveillance: Secondary | ICD-10-CM | POA: Diagnosis not present

## 2016-10-13 ENCOUNTER — Ambulatory Visit (HOSPITAL_COMMUNITY)
Admission: RE | Admit: 2016-10-13 | Discharge: 2016-10-13 | Disposition: A | Discharge status: Home / Self Care | Payer: BLUE CROSS/BLUE SHIELD | Source: Ambulatory Visit | Attending: Internal Medicine | Admitting: Internal Medicine

## 2016-10-13 ENCOUNTER — Encounter (HOSPITAL_COMMUNITY)
Admission: RE | Disposition: A | Discharge status: Home / Self Care | Payer: Self-pay | Source: Ambulatory Visit | Attending: Internal Medicine

## 2016-10-13 ENCOUNTER — Encounter (HOSPITAL_COMMUNITY): Payer: Self-pay | Admitting: *Deleted

## 2016-10-13 DIAGNOSIS — K219 Gastro-esophageal reflux disease without esophagitis: Secondary | ICD-10-CM | POA: Insufficient documentation

## 2016-10-13 DIAGNOSIS — R194 Change in bowel habit: Secondary | ICD-10-CM | POA: Insufficient documentation

## 2016-10-13 DIAGNOSIS — D120 Benign neoplasm of cecum: Secondary | ICD-10-CM | POA: Diagnosis not present

## 2016-10-13 DIAGNOSIS — R195 Other fecal abnormalities: Secondary | ICD-10-CM | POA: Diagnosis not present

## 2016-10-13 DIAGNOSIS — Z86010 Personal history of colonic polyps: Secondary | ICD-10-CM

## 2016-10-13 DIAGNOSIS — Z8673 Personal history of transient ischemic attack (TIA), and cerebral infarction without residual deficits: Secondary | ICD-10-CM | POA: Insufficient documentation

## 2016-10-13 DIAGNOSIS — F17210 Nicotine dependence, cigarettes, uncomplicated: Secondary | ICD-10-CM | POA: Insufficient documentation

## 2016-10-13 DIAGNOSIS — K621 Rectal polyp: Secondary | ICD-10-CM | POA: Diagnosis not present

## 2016-10-13 DIAGNOSIS — G43909 Migraine, unspecified, not intractable, without status migrainosus: Secondary | ICD-10-CM | POA: Diagnosis not present

## 2016-10-13 DIAGNOSIS — K5730 Diverticulosis of large intestine without perforation or abscess without bleeding: Secondary | ICD-10-CM | POA: Insufficient documentation

## 2016-10-13 DIAGNOSIS — E785 Hyperlipidemia, unspecified: Secondary | ICD-10-CM | POA: Insufficient documentation

## 2016-10-13 DIAGNOSIS — D122 Benign neoplasm of ascending colon: Secondary | ICD-10-CM | POA: Diagnosis not present

## 2016-10-13 DIAGNOSIS — K6289 Other specified diseases of anus and rectum: Secondary | ICD-10-CM | POA: Insufficient documentation

## 2016-10-13 DIAGNOSIS — Z79899 Other long term (current) drug therapy: Secondary | ICD-10-CM | POA: Diagnosis not present

## 2016-10-13 HISTORY — PX: COLONOSCOPY: SHX5424

## 2016-10-13 HISTORY — PX: POLYPECTOMY: SHX5525

## 2016-10-13 SURGERY — COLONOSCOPY
Anesthesia: Moderate Sedation

## 2016-10-13 MED ORDER — FENTANYL CITRATE (PF) 100 MCG/2ML IJ SOLN
INTRAMUSCULAR | Status: DC | PRN
  Administered 2016-10-13 (×2): 50 ug via INTRAVENOUS
  Administered 2016-10-13: 25 ug via INTRAVENOUS

## 2016-10-13 MED ORDER — SODIUM CHLORIDE 0.9 % IV SOLN: INTRAVENOUS | Status: DC

## 2016-10-13 MED ORDER — MEPERIDINE HCL 100 MG/ML IJ SOLN
INTRAMUSCULAR | Status: AC
  Filled 2016-10-13: qty 2

## 2016-10-13 MED ORDER — ONDANSETRON HCL 4 MG/2ML IJ SOLN
INTRAMUSCULAR | Status: DC | PRN
  Administered 2016-10-13: 4 mg via INTRAVENOUS

## 2016-10-13 MED ORDER — MIDAZOLAM HCL 5 MG/5ML IJ SOLN
INTRAMUSCULAR | Status: DC | PRN
  Administered 2016-10-13: 2 mg via INTRAVENOUS
  Administered 2016-10-13: 1 mg via INTRAVENOUS
  Administered 2016-10-13: 2 mg via INTRAVENOUS
  Administered 2016-10-13 (×2): 1 mg via INTRAVENOUS

## 2016-10-13 MED ORDER — ONDANSETRON HCL 4 MG/2ML IJ SOLN
INTRAMUSCULAR | Status: AC
  Filled 2016-10-13: qty 2

## 2016-10-13 MED ORDER — FENTANYL CITRATE (PF) 100 MCG/2ML IJ SOLN
INTRAMUSCULAR | Status: AC
  Filled 2016-10-13: qty 4

## 2016-10-13 MED ORDER — MIDAZOLAM HCL 5 MG/5ML IJ SOLN
INTRAMUSCULAR | Status: AC
  Filled 2016-10-13: qty 10

## 2016-10-13 MED ORDER — STERILE WATER FOR IRRIGATION IR SOLN
Status: DC | PRN
  Administered 2016-10-13: 09:00:00

## 2016-10-13 NOTE — Discharge Instructions (Addendum)
Polyp and diverticulosis information provided  Begin Benefiber 1 tablespoon twice daily  Further recommendations to follow pending review of pathology report  Office visit with [place] in 2 months. Appointment with Vicente Males on July 5 at 9:30 Colonoscopy Discharge Instructions  Read the instructions outlined below and refer to this sheet in the next few weeks. These discharge instructions provide you with general information on caring for yourself after you leave the hospital. Your doctor may also give you specific instructions. While your treatment has been planned according to the most current medical practices available, unavoidable complications occasionally occur. If you have any problems or questions after discharge, call Dr. Gala Romney at [phone]. ACTIVITY  You may resume your regular activity, but move at a slower pace for the next 24 hours.   Take frequent rest periods for the next 24 hours.   Walking will help get rid of the air and reduce the bloated feeling in your belly (abdomen).   No driving for 24 hours (because of the medicine (anesthesia) used during the test).    Do not sign any important legal documents or operate any machinery for 24 hours (because of the anesthesia used during the test).  NUTRITION  Drink plenty of fluids.   You may resume your normal diet as instructed by your doctor.   Begin with a light meal and progress to your normal diet. Heavy or fried foods are harder to digest and may make you feel sick to your stomach (nauseated).   Avoid alcoholic beverages for 24 hours or as instructed.  MEDICATIONS  You may resume your normal medications unless your doctor tells you otherwise.  WHAT YOU CAN EXPECT TODAY  Some feelings of bloating in the abdomen.   Passage of more gas than usual.   Spotting of blood in your stool or on the toilet paper.  IF YOU HAD POLYPS REMOVED DURING THE COLONOSCOPY:  No aspirin products for 7 days or as instructed.   No alcohol for  7 days or as instructed.   Eat a soft diet for the next 24 hours.  FINDING OUT THE RESULTS OF YOUR TEST Not all test results are available during your visit. If your test results are not back during the visit, make an appointment with your caregiver to find out the results. Do not assume everything is normal if you have not heard from your caregiver or the medical facility. It is important for you to follow up on all of your test results.  SEEK IMMEDIATE MEDICAL ATTENTION IF:  You have more than a spotting of blood in your stool.   Your belly is swollen (abdominal distention).   You are nauseated or vomiting.   You have a temperature over 101.   You have abdominal pain or discomfort that is severe or gets worse throughout the day.   Colon Polyps Polyps are tissue growths inside the body. Polyps can grow in many places, including the large intestine (colon). A polyp may be a round bump or a mushroom-shaped growth. You could have one polyp or several. Most colon polyps are noncancerous (benign). However, some colon polyps can become cancerous over time. What are the causes? The exact cause of colon polyps is not known. What increases the risk? This condition is more likely to develop in people who:  Have a family history of colon cancer or colon polyps.  Are older than 47 or older than 45 if they are African American.  Have inflammatory bowel disease, such as ulcerative  colitis or Crohn disease.  Are overweight.  Smoke cigarettes.  Do not get enough exercise.  Drink too much alcohol.  Eat a diet that is:  High in fat and red meat.  Low in fiber.  Had childhood cancer that was treated with abdominal radiation. What are the signs or symptoms? Most polyps do not cause symptoms. If you have symptoms, they may include:  Blood coming from your rectum when having a bowel movement.  Blood in your stool.The stool may look dark red or black.  A change in bowel habits, such  as constipation or diarrhea. How is this diagnosed? This condition is diagnosed with a colonoscopy. This is a procedure that uses a lighted, flexible scope to look at the inside of your colon. How is this treated? Treatment for this condition involves removing any polyps that are found. Those polyps will then be tested for cancer. If cancer is found, your health care provider will talk to you about options for colon cancer treatment. Follow these instructions at home: Diet   Eat plenty of fiber, such as fruits, vegetables, and whole grains.  Eat foods that are high in calcium and vitamin D, such as milk, cheese, yogurt, eggs, liver, fish, and broccoli.  Limit foods high in fat, red meats, and processed meats, such as hot dogs, sausage, bacon, and lunch meats.  Maintain a healthy weight, or lose weight if recommended by your health care provider. General instructions   Do not smoke cigarettes.  Do not drink alcohol excessively.  Keep all follow-up visits as told by your health care provider. This is important. This includes keeping regularly scheduled colonoscopies. Talk to your health care provider about when you need a colonoscopy.  Exercise every day or as told by your health care provider. Contact a health care provider if:  You have new or worsening bleeding during a bowel movement.  You have new or increased blood in your stool.  You have a change in bowel habits.  You unexpectedly lose weight. This information is not intended to replace advice given to you by your health care provider. Make sure you discuss any questions you have with your health care provider. Document Released: 02/26/2004 Document Revised: 11/07/2015 Document Reviewed: 04/22/2015 Elsevier Interactive Patient Education  2017 [employer]  Diverticulosis Diverticulosis is a condition that develops when small pouches (diverticula) form in the wall of the large intestine (colon). The colon is where water  is absorbed and stool is formed. The pouches form when the inside layer of the colon pushes through weak spots in the outer layers of the colon. You may have a few pouches or many of them. What are the causes? The cause of this condition is not known. What increases the risk? The following factors may make you more likely to develop this condition:  Being older than age 42. Your risk for this condition increases with age. Diverticulosis is rare among people younger than age 93. By age 17, many people have it.  Eating a low-fiber diet.  Having frequent constipation.  Being overweight.  Not getting enough exercise.  Smoking.  Taking over-the-counter pain medicines, like aspirin and ibuprofen.  Having a family history of diverticulosis. What are the signs or symptoms? In most people, there are no symptoms of this condition. If you do have symptoms, they may include:  Bloating.  Cramps in the abdomen.  Constipation or diarrhea.  Pain in the lower left side of the abdomen. How is this diagnosed? This condition  is most often diagnosed during an exam for other colon problems. Because diverticulosis usually has no symptoms, it often cannot be diagnosed independently. This condition may be diagnosed by:  Using a flexible scope to examine the colon (colonoscopy).  Taking an X-ray of the colon after dye has been put into the colon (barium enema).  Doing a CT scan. How is this treated? You may not need treatment for this condition if you have never developed an infection related to diverticulosis. If you have had an infection before, treatment may include:  Eating a high-fiber diet. This may include eating more fruits, vegetables, and grains.  Taking a fiber supplement.  Taking a live bacteria supplement (probiotic).  Taking medicine to relax your colon.  Taking antibiotic medicines. Follow these instructions at home:  Drink 6-8 glasses of water or more each day to prevent  constipation.  Try not to strain when you have a bowel movement.  If you have had an infection before:  Eat more fiber as directed by your health care provider or your diet and nutrition specialist (dietitian).  Take a fiber supplement or probiotic, if your health care provider approves.  Take over-the-counter and prescription medicines only as told by your health care provider.  If you were prescribed an antibiotic, take it as told by your health care provider. Do not stop taking the antibiotic even if you start to feel better.  Keep all follow-up visits as told by your health care provider. This is important. Contact a health care provider if:  You have pain in your abdomen.  You have bloating.  You have cramps.  You have not had a bowel movement in 3 days. Get help right away if:  Your pain gets worse.  Your bloating becomes very bad.  You have a fever or chills, and your symptoms suddenly get worse.  You vomit.  You have bowel movements that are bloody or black.  You have bleeding from your rectum. Summary  Diverticulosis is a condition that develops when small pouches (diverticula) form in the wall of the large intestine (colon).  You may have a few pouches or many of them.  This condition is most often diagnosed during an exam for other colon problems.  If you have had an infection related to diverticulosis, treatment may include increasing the fiber in your diet, taking supplements, or taking medicines. This information is not intended to replace advice given to you by your health care provider. Make sure you discuss any questions you have with your health care provider. Document Released: 02/27/2004 Document Revised: 04/20/2016 Document Reviewed: 04/20/2016 Elsevier Interactive Patient Education  2017 [employer].

## 2016-10-13 NOTE — H&P (Signed)
@LOGO @   Primary Care Physician:  Karis Juba, PA-C Primary Gastroenterologist:  Dr. Gala Romney  Pre-Procedure History & Physical: HPI:  Jessica Shaw is a 51 y.o. female here for further evaluation change in stool caliber. No bleeding. Intermittent lancinating rectal pain.  Past Medical History:  Diagnosis Date  . Complication of anesthesia   . Embolism - blood clot    arm  . GERD (gastroesophageal reflux disease)   . HLD (hyperlipidemia)   . Migraine   . PONV (postoperative nausea and vomiting)   . Smoker   . Stroke [facility])    TIA    Past Surgical History:  Procedure Laterality Date  . CHOLECYSTECTOMY    . COLONOSCOPY  08/04/2005   MGN:OIBBC external hemorrhoids and anal papilla, otherwise normal  . COLONOSCOPY N/A 10/07/2012   Procedure: COLONOSCOPY;  Surgeon: Daneil Dolin, MD;  [facility];  Service: Endoscopy;  Laterality: N/A;  10:30  . ESOPHAGOGASTRODUODENOSCOPY  04/20/2003   WUG:QBVQ changes of reflux esophagitis/Limited gastroesophageal junction, otherwise normal  . ESOPHAGOGASTRODUODENOSCOPY N/A 10/07/2012   Procedure: ESOPHAGOGASTRODUODENOSCOPY (EGD);  Surgeon: Daneil Dolin, MD;  [facility];  Service: Endoscopy;  Laterality: N/A;  . PATENT FORAMEN OVALE CLOSURE    . SHOULDER ARTHROSCOPY     left  . SPINAL CORD STIMULATOR IMPLANT    . TUBAL LIGATION      Prior to Admission medications   Medication Sig Start Date End Date Taking? Authorizing Provider  atorvastatin (LIPITOR) 80 MG tablet TAKE ONE TABLET BY MOUTH ONCE DAILY 09/22/16  Yes Orlena Sheldon, PA-C  Ibuprofen (ADVIL MIGRAINE) 200 MG CAPS Take 400 mg by mouth daily as needed (migraines).   Yes Historical Provider, MD  omeprazole (PRILOSEC) 20 MG capsule Take 1 capsule (20 mg total) by mouth daily. 07/16/16  Yes Mary B Dixon, PA-C  polyethylene glycol-electrolytes (TRILYTE) 420 g solution Take 4,000 mLs by mouth as directed. 09/01/16  Yes Daneil Dolin, MD  propranolol (INDERAL) 10  MG tablet TAKE ONE TABLET BY MOUTH TWICE DAILY 09/22/16  Yes Mary B Dixon, PA-C  RELPAX 40 MG tablet TAKE ONE TABLET BY MOUTH ONCE DAILY AS NEEDED FOR MIGRAINE. MAY REPEAT IN 2 HOURS IF NEEDED 04/30/16  Yes Orlena Sheldon, PA-C  venlafaxine XR (EFFEXOR-XR) 75 MG 24 hr capsule TAKE ONE CAPSULE BY MOUTH ONCE DAILY WITH BREAKFAST 09/22/16  Yes Orlena Sheldon, PA-C    Allergies as of 09/02/2016 - Review Complete 09/01/2016  Allergen Reaction Noted  . Aspirin Other (See Comments) 10/16/2010  . Demerol [meperidine]  11/12/2013  . Morphine and related Other (See Comments) 10/16/2010  . Latex Itching and Rash 10/16/2010    Family History  Problem Relation Age of Onset  . Diabetes Father   . Diabetes Brother   . Diabetes Brother   . Diabetes Brother   . Diabetes Brother   . Diabetes Brother   . Cancer Mother     unsure primary, deceased at age 87  . Diabetes    . Colon cancer Neg Hx     Social History   Social History  . Marital status: Married    Spouse name: N/A  . Number of children: N/A  . Years of education: N/A   Occupational History  . Clinical biochemist   Social History Main Topics  . Smoking status: Current Every Day Smoker    Packs/day: 0.50    Years: 30.00    Types: Cigarettes  . Smokeless tobacco: Never  Used  . Alcohol use No  . Drug use: No  . Sexual activity: Yes   Other Topics Concern  . Not on file   Social History Narrative   Today patient informed me that her father, who is actually a patient of mine-- named  Chief of Staff-- I had no idea of this until today!!   She says that he actually lives with them now.   She also lives with her husband and her 1 year old son.   Says she also has one other son who is 92 and married with a child.      She works as a Pharmacist, hospital at [employer].   Does no exercise.   Has smoked about 1/2 ppd since age 76 years old-- now this has been for 30 years.(as of 2015)    Review of  Systems: See HPI, otherwise negative ROS  Physical Exam: BP 115/75   Pulse 70   Temp 98.5 F (36.9 C) (Oral)   Resp 16   Ht 5\' 2"  (1.575 m)   Wt 173 lb (78.5 kg)   SpO2 98%   BMI 31.64 kg/m  General:   Alert,  Well-developed, well-nourished, pleasant and cooperative in NAD Neck:  Supple; no masses or thyromegaly. No significant cervical adenopathy. Lungs:  Clear throughout to auscultation.   No wheezes, crackles, or rhonchi. No acute distress. Heart:  Regular rate and rhythm; no murmurs, clicks, rubs,  or gallops. Abdomen: Non-distended, normal bowel sounds.  Soft and nontender without appreciable mass or hepatosplenomegaly.  Pulses:  Normal pulses noted. Extremities:  Without clubbing or edema.  Impression:  Proctalgia and change in bowel habits. Colonoscopy being performed today to further evaluate.  Recommendations:  Diagnostic colonoscopy today.  The risks, benefits, limitations, alternatives and imponderables have been reviewed with the patient. Questions have been answered. All parties are agreeable.          Notice: This dictation was prepared with Dragon dictation along with smaller phrase technology. Any transcriptional errors that result from this process are unintentional and may not be corrected upon review.

## 2016-10-13 NOTE — Op Note (Signed)
[facility] Patient Name: Jessica Shaw Procedure Date: 10/13/2016 8:38 AM MRN: [id number] Date of Birth: [birth year] Attending MD: Norvel Richards , MD CSN: [id number] Age: 51 Admit Type: Outpatient Procedure:                Colonoscopy with multiple snare polypectomies Indications:              Change in stool caliber Providers:                Norvel Richards, MD, Otis Peak B. Gwenlyn Perking RN, RN,                            Rosina Lowenstein, RN Referring MD:              Medicines:                Midazolam 7 mg IV, Fentanyl 125 micrograms IV,                            Ondansetron 4 mg IV Complications:            No immediate complications. Estimated Blood Loss:     Estimated blood loss was minimal. Procedure:                Pre-Anesthesia Assessment:                           - Prior to the procedure, a History and Physical                            was performed, and patient medications and                            allergies were reviewed. The patient's tolerance of                            previous anesthesia was also reviewed. The risks                            and benefits of the procedure and the sedation                            options and risks were discussed with the patient.                            All questions were answered, and informed consent                            was obtained. Prior Anticoagulants: The patient has                            taken no previous anticoagulant or antiplatelet                            agents. ASA Grade Assessment: II - A patient with  mild systemic disease. After reviewing the risks                            and benefits, the patient was deemed in                            satisfactory condition to undergo the procedure.                           After obtaining informed consent, the colonoscope                            was passed under direct vision. Throughout the                             procedure, the patient's blood pressure, pulse, and                            oxygen saturations were monitored continuously. The                            EC-[id number] ([id number]) scope was introduced through                            the anus and advanced to the the cecum, identified                            by appendiceal orifice and ileocecal valve. The                            ileocecal valve, appendiceal orifice, and rectum                            were photographed. The entire colon was well                            visualized. The entire colon was well visualized.                            The quality of the bowel preparation was adequate.                            The colonoscopy was performed without difficulty.                            The ileocecal valve, appendiceal orifice, and                            rectum were photographed. Scope In: 9:13:26 AM Scope Out: 9:40:22 AM Scope Withdrawal Time: 0 hours 8 minutes 24 seconds  Total Procedure Duration: 0 hours 26 minutes 56 seconds  Findings:      The perianal and digital rectal examinations were normal.      A few small-mouthed diverticula were found in the sigmoid colon.  Three semi-pedunculated polyps were found in the rectum, ascending colon       and cecum. The polyps were 4 to 5 mm in size. These polyps were removed       with a cold snare. Resection and retrieval were complete. Estimated       blood loss was minimal.      The exam was otherwise without abnormality on direct and retroflexion       views. Impression:               - Diverticulosis in the sigmoid colon.                           - Three 4 to 5 mm polyps in the rectum, in the                            ascending colon and in the cecum, removed with a                            cold snare. Resected and retrieved.                           - The examination was otherwise normal on direct                            and retroflexion  views. Moderate Sedation:      Moderate (conscious) sedation was administered by the endoscopy nurse       and supervised by the endoscopist. The following parameters were       monitored: oxygen saturation, heart rate, blood pressure, respiratory       rate, EKG, adequacy of pulmonary ventilation, and response to care.       Total physician intraservice time was 33 minutes. Recommendation:           - Patient has a contact number available for                            emergencies. The signs and symptoms of potential                            delayed complications were discussed with the                            patient. Return to normal activities tomorrow.                            Written discharge instructions were provided to the                            patient.                           - Resume previous diet.                           - Continue present medications. Begin Benefiber 1  tablespoon twice daily                           - Await pathology results.                           - Repeat colonoscopy date to be determined after                            pending pathology results are reviewed for                            surveillance based on pathology results.                           - Return to GI clinic in 2 months. Procedure Code(s):        --- Professional ---                           [phone], Colonoscopy, flexible; with removal of                            tumor(s), polyp(s), or other lesion(s) by snare                            technique                           99152, Moderate sedation services provided by the                            same physician or other qualified health care                            professional performing the diagnostic or                            therapeutic service that the sedation supports,                            requiring the presence of an independent trained                             observer to assist in the monitoring of the                            patient's level of consciousness and physiological                            status; initial 15 minutes of intraservice time,                            patient age 45 years or older  45364, Moderate sedation services; each additional                            15 minutes intraservice time Diagnosis Code(s):        --- Professional ---                           K62.1, Rectal polyp                           D12.2, Benign neoplasm of ascending colon                           D12.0, Benign neoplasm of cecum                           R19.5, Other fecal abnormalities                           K57.30, Diverticulosis of large intestine without                            perforation or abscess without bleeding CPT copyright 2016 American Medical Association. All rights reserved. The codes documented in this report are preliminary and upon coder review may  be revised to meet current compliance requirements. Cristopher Estimable. Leslee Haueter, MD Norvel Richards, MD 10/13/2016 9:47:30 AM This report has been signed electronically. Number of Addenda: 0

## 2016-10-13 NOTE — Progress Notes (Signed)
Jessica Shaw may return to work on Thursday 5/3.  Althea Grimmer RN

## 2016-10-15 ENCOUNTER — Encounter (HOSPITAL_COMMUNITY): Payer: Self-pay | Admitting: Internal Medicine

## 2016-10-18 ENCOUNTER — Encounter: Payer: Self-pay | Admitting: Internal Medicine

## 2016-10-21 DIAGNOSIS — Z713 Dietary counseling and surveillance: Secondary | ICD-10-CM | POA: Diagnosis not present

## 2016-11-04 DIAGNOSIS — Z713 Dietary counseling and surveillance: Secondary | ICD-10-CM | POA: Diagnosis not present

## 2016-11-18 DIAGNOSIS — Z713 Dietary counseling and surveillance: Secondary | ICD-10-CM | POA: Diagnosis not present

## 2016-11-28 ENCOUNTER — Other Ambulatory Visit: Payer: Self-pay | Admitting: Physician Assistant

## 2016-11-28 DIAGNOSIS — R1013 Epigastric pain: Secondary | ICD-10-CM

## 2016-11-30 NOTE — Telephone Encounter (Signed)
Refill appropriate 

## 2016-12-12 ENCOUNTER — Other Ambulatory Visit: Payer: Self-pay | Admitting: Physician Assistant

## 2016-12-12 DIAGNOSIS — R1013 Epigastric pain: Secondary | ICD-10-CM

## 2016-12-14 NOTE — Telephone Encounter (Signed)
Refill appropriate 

## 2016-12-17 ENCOUNTER — Ambulatory Visit: Payer: BLUE CROSS/BLUE SHIELD | Admitting: Gastroenterology

## 2017-01-01 ENCOUNTER — Encounter: Payer: Self-pay | Admitting: Family Medicine

## 2017-01-01 ENCOUNTER — Ambulatory Visit (INDEPENDENT_AMBULATORY_CARE_PROVIDER_SITE_OTHER): Payer: BLUE CROSS/BLUE SHIELD | Admitting: Family Medicine

## 2017-01-01 VITALS — BP 132/84 | HR 84 | Temp 98.0°F | Resp 16 | Ht 62.0 in | Wt 169.0 lb

## 2017-01-01 DIAGNOSIS — M7541 Impingement syndrome of right shoulder: Secondary | ICD-10-CM | POA: Diagnosis not present

## 2017-01-01 NOTE — Progress Notes (Signed)
Subjective:    Patient ID: Jessica Shaw, female    DOB: 1966/03/07, 51 y.o.   MRN: [id number]  HPI Patient presents with 3-4 weeks of pain in her right shoulder. Pain is worse with abduction greater than 90. She has no pain with internal rotation or external rotation. She is also noticed a prominence near the before meals joint. It is tender to palpation in that area.  She denies any pain with internal or external rotation. She also has been reporting joint pains and muscle pains all over her body. She states that everyday she hurt someone else. One day it may be her ankle. The next day maybe her knee. Some days as her hands. The pains come and go. She denies any fevers. She denies any rashes. She denies any symmetric simultaneous involvement of joints. Past Medical History:  Diagnosis Date  . Complication of anesthesia   . Embolism - blood clot    arm  . GERD (gastroesophageal reflux disease)   . HLD (hyperlipidemia)   . Migraine   . PONV (postoperative nausea and vomiting)   . Smoker   . Stroke [facility])    TIA   Past Surgical History:  Procedure Laterality Date  . CHOLECYSTECTOMY    . COLONOSCOPY  08/04/2005   UGQ:BVQXI external hemorrhoids and anal papilla, otherwise normal  . COLONOSCOPY N/A 10/07/2012   Procedure: COLONOSCOPY;  Surgeon: Daneil Dolin, MD;  [facility];  Service: Endoscopy;  Laterality: N/A;  10:30  . COLONOSCOPY N/A 10/13/2016   Procedure: COLONOSCOPY;  Surgeon: Daneil Dolin, MD;  [facility];  Service: Endoscopy;  Laterality: N/A;  9:00am  . ESOPHAGOGASTRODUODENOSCOPY  04/20/2003   HWT:UUEK changes of reflux esophagitis/Limited gastroesophageal junction, otherwise normal  . ESOPHAGOGASTRODUODENOSCOPY N/A 10/07/2012   Procedure: ESOPHAGOGASTRODUODENOSCOPY (EGD);  Surgeon: Daneil Dolin, MD;  [facility];  Service: Endoscopy;  Laterality: N/A;  . PATENT FORAMEN OVALE CLOSURE    . POLYPECTOMY  10/13/2016   Procedure:  POLYPECTOMY;  Surgeon: Daneil Dolin, MD;  [facility];  Service: Endoscopy;;  colon  . SHOULDER ARTHROSCOPY     left  . SPINAL CORD STIMULATOR IMPLANT    . TUBAL LIGATION     Current Outpatient Prescriptions on File Prior to Visit  Medication Sig Dispense Refill  . atorvastatin (LIPITOR) 80 MG tablet TAKE ONE TABLET BY MOUTH ONCE DAILY 90 tablet 1  . Ibuprofen (ADVIL MIGRAINE) 200 MG CAPS Take 400 mg by mouth daily as needed (migraines).    Marland Kitchen omeprazole (PRILOSEC) 20 MG capsule TAKE 1 CAPSULE BY MOUTH ONCE DAILY 90 capsule 0  . propranolol (INDERAL) 10 MG tablet TAKE ONE TABLET BY MOUTH TWICE DAILY 180 tablet 1  . venlafaxine XR (EFFEXOR-XR) 75 MG 24 hr capsule TAKE ONE CAPSULE BY MOUTH ONCE DAILY WITH BREAKFAST 90 capsule 1  . RELPAX 40 MG tablet TAKE ONE TABLET BY MOUTH ONCE DAILY AS NEEDED FOR MIGRAINE. MAY REPEAT IN 2 HOURS IF NEEDED 12 tablet 3   No current facility-administered medications on file prior to visit.    Allergies  Allergen Reactions  . Aspirin Other (See Comments)    G.I. Upset   . Demerol [Meperidine]     Gi upset,   . Morphine And Related Other (See Comments)    G.I. Upset  . Latex Itching and Rash   Social History   Social History  . Marital status: Married    Spouse name: N/A  .  Number of children: N/A  . Years of education: N/A   Occupational History  . Clinical biochemist   Social History Main Topics  . Smoking status: Current Every Day Smoker    Packs/day: 0.50    Years: 30.00    Types: Cigarettes  . Smokeless tobacco: Never Used  . Alcohol use No  . Drug use: No  . Sexual activity: Yes   Other Topics Concern  . Not on file   Social History Narrative   Today patient informed me that her father, who is actually a patient of mine-- named  Chief of Staff-- I had no idea of this until today!!   She says that he actually lives with them now.   She also lives with her husband and her 58 year old son.   Says she also has  one other son who is 31 and married with a child.      She works as a Pharmacist, hospital at [employer].   Does no exercise.   Has smoked about 1/2 ppd since age 57 years old-- now this has been for 30 years.(as of 2015)      Review of Systems     Objective:   Physical Exam  Cardiovascular: Normal rate, regular rhythm and normal heart sounds.   Pulmonary/Chest: Effort normal and breath sounds normal. No respiratory distress. She has no wheezes. She has no rales.  Musculoskeletal:       Right shoulder: She exhibits decreased range of motion, tenderness, bony tenderness and pain. She exhibits normal strength.  Vitals reviewed.         Assessment & Plan:  Impingement syndrome of right shoulder - Plan: DG Shoulder Right  Patient has a positive empty can sign. She has a positive Hawkins maneuver. I believe she has impingement syndrome and tendinitis in the supraspinatus muscle. Offer the patient a cortisone injection versus physical therapy. She would like to try home exercises first and then return for cortisone injection if no better in 3 or 4 weeks. In the meantime she can take ibuprofen as needed for pain. I will obtain an x-ray of the shoulder for further evaluation. I believe the polyarthralgias are likely age-related.  She would like to try to discontinue Lipitor for 2 weeks to see if the pains would improve to see if it could be related to her medication. If the pains do not improve, she will resume the Lipitor. At that point I would undergo a diagnostic workup for autoimmune diseases by checking a sedimentation rate, and ANA, rheumatoid factor.

## 2017-01-04 ENCOUNTER — Other Ambulatory Visit: Payer: Self-pay | Admitting: Physician Assistant

## 2017-01-04 DIAGNOSIS — Z1231 Encounter for screening mammogram for malignant neoplasm of breast: Secondary | ICD-10-CM

## 2017-01-11 ENCOUNTER — Ambulatory Visit (HOSPITAL_COMMUNITY)
Admission: RE | Admit: 2017-01-11 | Discharge: 2017-01-11 | Disposition: A | Discharge status: Home / Self Care | Payer: BLUE CROSS/BLUE SHIELD | Source: Ambulatory Visit | Attending: Physician Assistant | Admitting: Physician Assistant

## 2017-01-11 ENCOUNTER — Ambulatory Visit (HOSPITAL_COMMUNITY)
Admission: RE | Admit: 2017-01-11 | Discharge: 2017-01-11 | Disposition: A | Discharge status: Home / Self Care | Payer: BLUE CROSS/BLUE SHIELD | Source: Ambulatory Visit | Attending: Family Medicine | Admitting: Family Medicine

## 2017-01-11 DIAGNOSIS — Z1231 Encounter for screening mammogram for malignant neoplasm of breast: Secondary | ICD-10-CM | POA: Diagnosis not present

## 2017-01-11 DIAGNOSIS — M7541 Impingement syndrome of right shoulder: Secondary | ICD-10-CM | POA: Insufficient documentation

## 2017-01-11 DIAGNOSIS — M19011 Primary osteoarthritis, right shoulder: Secondary | ICD-10-CM | POA: Insufficient documentation

## 2017-01-12 ENCOUNTER — Encounter: Payer: Self-pay | Admitting: Family Medicine

## 2017-02-09 ENCOUNTER — Ambulatory Visit: Payer: BLUE CROSS/BLUE SHIELD | Admitting: Gastroenterology

## 2017-03-18 ENCOUNTER — Ambulatory Visit: Payer: BLUE CROSS/BLUE SHIELD | Admitting: Physician Assistant

## 2017-03-19 ENCOUNTER — Other Ambulatory Visit: Payer: Self-pay | Admitting: Physician Assistant

## 2017-03-19 ENCOUNTER — Encounter: Payer: Self-pay | Admitting: Physician Assistant

## 2017-03-19 DIAGNOSIS — N951 Menopausal and female climacteric states: Secondary | ICD-10-CM

## 2017-03-19 DIAGNOSIS — E785 Hyperlipidemia, unspecified: Secondary | ICD-10-CM

## 2017-03-19 DIAGNOSIS — G250 Essential tremor: Secondary | ICD-10-CM

## 2017-03-19 NOTE — Telephone Encounter (Signed)
Requires office visit before any further refills can be given.  

## 2017-03-19 NOTE — Telephone Encounter (Signed)
Medication filled x1 with no refills.   Requires office visit before any further refills can be given.   Letter sent.  

## 2017-04-09 DIAGNOSIS — Z23 Encounter for immunization: Secondary | ICD-10-CM | POA: Diagnosis not present

## 2017-04-12 ENCOUNTER — Other Ambulatory Visit: Payer: Self-pay | Admitting: Physician Assistant

## 2017-04-13 NOTE — Telephone Encounter (Signed)
Refill appropriate 

## 2017-04-19 ENCOUNTER — Other Ambulatory Visit: Payer: Self-pay | Admitting: Physician Assistant

## 2017-04-19 NOTE — Telephone Encounter (Signed)
Refill appropriate 

## 2017-04-22 ENCOUNTER — Ambulatory Visit: Payer: BLUE CROSS/BLUE SHIELD | Admitting: Physician Assistant

## 2017-04-22 ENCOUNTER — Encounter: Payer: Self-pay | Admitting: Physician Assistant

## 2017-04-22 VITALS — BP 130/92 | HR 81 | Temp 97.9°F | Resp 14 | Wt 164.0 lb

## 2017-04-22 DIAGNOSIS — K219 Gastro-esophageal reflux disease without esophagitis: Secondary | ICD-10-CM | POA: Diagnosis not present

## 2017-04-22 DIAGNOSIS — Z114 Encounter for screening for human immunodeficiency virus [HIV]: Secondary | ICD-10-CM | POA: Diagnosis not present

## 2017-04-22 DIAGNOSIS — F17200 Nicotine dependence, unspecified, uncomplicated: Secondary | ICD-10-CM | POA: Diagnosis not present

## 2017-04-22 DIAGNOSIS — E785 Hyperlipidemia, unspecified: Secondary | ICD-10-CM | POA: Diagnosis not present

## 2017-04-22 DIAGNOSIS — G43009 Migraine without aura, not intractable, without status migrainosus: Secondary | ICD-10-CM

## 2017-04-22 DIAGNOSIS — N951 Menopausal and female climacteric states: Secondary | ICD-10-CM | POA: Diagnosis not present

## 2017-04-22 DIAGNOSIS — R5383 Other fatigue: Secondary | ICD-10-CM

## 2017-04-22 DIAGNOSIS — G47429 Narcolepsy in conditions classified elsewhere without cataplexy: Secondary | ICD-10-CM
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
## 2017-04-23 LAB — CBC WITH DIFFERENTIAL/PLATELET
BASOS ABS: 142 {cells}/uL (ref 0–200)
Basophils Relative: 1.2 %
EOS PCT: 2.1 %
Eosinophils Absolute: 248 cells/uL (ref 15–500)
HCT: 44.6 % (ref 35.0–45.0)
HEMOGLOBIN: 15.2 g/dL (ref 11.7–15.5)
Lymphs Abs: 3988 cells/uL — ABNORMAL HIGH (ref 850–3900)
MCH: 30.2 pg (ref 27.0–33.0)
MCHC: 34.1 g/dL (ref 32.0–36.0)
MCV: 88.5 fL (ref 80.0–100.0)
MPV: 10.3 fL (ref 7.5–12.5)
Monocytes Relative: 4.9 %
NEUTROS ABS: 6844 {cells}/uL (ref 1500–7800)
NEUTROS PCT: 58 %
Platelets: 334 10*3/uL (ref 140–400)
RBC: 5.04 10*6/uL (ref 3.80–5.10)
RDW: 12.8 % (ref 11.0–15.0)
Total Lymphocyte: 33.8 %
WBC: 11.8 10*3/uL — ABNORMAL HIGH (ref 3.8–10.8)
WBCMIX: 578 {cells}/uL (ref 200–950)

## 2017-04-23 LAB — HEPATIC FUNCTION PANEL
AG Ratio: 1.9 (calc) (ref 1.0–2.5)
ALT: 30 U/L — AB (ref 6–29)
AST: 17 U/L (ref 10–35)
Albumin: 4.4 g/dL (ref 3.6–5.1)
Alkaline phosphatase (APISO): 67 U/L (ref 33–130)
BILIRUBIN DIRECT: 0.1 mg/dL (ref 0.0–0.2)
BILIRUBIN TOTAL: 0.3 mg/dL (ref 0.2–1.2)
GLOBULIN: 2.3 g/dL (ref 1.9–3.7)
Indirect Bilirubin: 0.2 mg/dL (calc) (ref 0.2–1.2)
Total Protein: 6.7 g/dL (ref 6.1–8.1)

## 2017-04-23 LAB — LIPID PANEL
CHOLESTEROL: 192 mg/dL (ref <200)
HDL: 41 mg/dL — AB (ref >50)
LDL Cholesterol (Calc): 118 mg/dL (calc) — ABNORMAL HIGH
Non-HDL Cholesterol (Calc): 151 mg/dL (calc) — ABNORMAL HIGH (ref <130)
Total CHOL/HDL Ratio: 4.7 (calc) (ref <5.0)
Triglycerides: 213 mg/dL — ABNORMAL HIGH (ref <150)

## 2017-04-23 LAB — HIV ANTIBODY (ROUTINE TESTING W REFLEX): HIV 1&2 Ab, 4th Generation: NONREACTIVE

## 2017-04-23 LAB — TSH: TSH: 0.91 mIU/L

## 2017-05-19 ENCOUNTER — Other Ambulatory Visit: Payer: Self-pay | Admitting: Physician Assistant

## 2017-05-19 NOTE — Telephone Encounter (Signed)
Refill appropriate 

## 2017-05-26 DIAGNOSIS — R569 Unspecified convulsions: Secondary | ICD-10-CM | POA: Diagnosis not present

## 2017-05-26 DIAGNOSIS — G47419 Narcolepsy without cataplexy: Secondary | ICD-10-CM | POA: Diagnosis not present

## 2017-05-26 DIAGNOSIS — G4710 Hypersomnia, unspecified: Secondary | ICD-10-CM | POA: Diagnosis not present

## 2017-05-26 DIAGNOSIS — G4733 Obstructive sleep apnea (adult) (pediatric): Secondary | ICD-10-CM | POA: Diagnosis not present

## 2017-06-07 ENCOUNTER — Other Ambulatory Visit: Payer: Self-pay | Admitting: Physician Assistant

## 2017-06-07 DIAGNOSIS — R1013 Epigastric pain: Secondary | ICD-10-CM

## 2017-06-16 ENCOUNTER — Other Ambulatory Visit: Payer: Self-pay | Admitting: Physician Assistant

## 2017-06-16 DIAGNOSIS — E785 Hyperlipidemia, unspecified: Secondary | ICD-10-CM

## 2017-06-28 ENCOUNTER — Other Ambulatory Visit: Payer: Self-pay | Admitting: Physician Assistant

## 2017-06-28 DIAGNOSIS — N951 Menopausal and female climacteric states: Secondary | ICD-10-CM

## 2017-06-28 NOTE — Telephone Encounter (Signed)
Refill appropriate 

## 2017-06-30 ENCOUNTER — Other Ambulatory Visit (HOSPITAL_BASED_OUTPATIENT_CLINIC_OR_DEPARTMENT_OTHER): Payer: Self-pay

## 2017-06-30 DIAGNOSIS — G4733 Obstructive sleep apnea (adult) (pediatric): Secondary | ICD-10-CM

## 2017-07-01 DIAGNOSIS — R569 Unspecified convulsions: Secondary | ICD-10-CM | POA: Diagnosis not present

## 2017-07-05 ENCOUNTER — Encounter: Payer: Self-pay | Admitting: Physician Assistant

## 2017-07-05 ENCOUNTER — Telehealth: Payer: Self-pay | Admitting: Physician Assistant

## 2017-07-05 ENCOUNTER — Ambulatory Visit: Payer: BLUE CROSS/BLUE SHIELD | Admitting: Physician Assistant

## 2017-07-05 VITALS — BP 132/84 | HR 92 | Temp 98.3°F | Resp 16 | Wt 171.8 lb

## 2017-07-05 DIAGNOSIS — J988 Other specified respiratory disorders: Secondary | ICD-10-CM

## 2017-07-05 DIAGNOSIS — J0100 Acute maxillary sinusitis, unspecified: Secondary | ICD-10-CM

## 2017-07-05 DIAGNOSIS — R52 Pain, unspecified: Secondary | ICD-10-CM

## 2017-07-05 DIAGNOSIS — J029 Acute pharyngitis, unspecified: Secondary | ICD-10-CM

## 2017-07-05 LAB — INFLUENZA A AND B AG, IMMUNOASSAY
INFLUENZA A ANTIGEN: NOT DETECTED
INFLUENZA B ANTIGEN: NOT DETECTED

## 2017-07-05 MED ORDER — PREDNISONE 20 MG PO TABS: ORAL_TABLET | ORAL | 0 refills | Status: DC

## 2017-07-05 MED ORDER — AZITHROMYCIN 250 MG PO TABS: ORAL_TABLET | ORAL | 0 refills | Status: DC

## 2017-07-05 NOTE — Progress Notes (Signed)
Patient ID: BREXLEY CUTSHAW MRN: [id number], DOB: 1965/07/06, 52 y.o. Date of Encounter: 07/05/2017, 9:47 AM    Chief Complaint:  Chief Complaint  Patient presents with  . Headache    x2days  . Ear Pain  . Sore Throat     HPI: 52 y.o. year old female presents with above.   She reports that when symptoms started, she "felt a little bit bad, had some headache. The next morning--"it was bad"---sinuses, ears, throat. Yesterday--was the worst. This morning symptoms persists so came in. Also having chest congestion --in addition to other symptoms. When she coughs, chest hurts.  Minimal cough.     Home Meds:   Outpatient Medications Prior to Visit  Medication Sig Dispense Refill  . atorvastatin (LIPITOR) 80 MG tablet TAKE 1 TABLET BY MOUTH ONCE DAILY 90 tablet 0  . eletriptan (RELPAX) 40 MG tablet TAKE ONE TABLET BY MOUTH ONCE DAILY AS NEEDED FOR MIGRAINE.  MAY REPEAT IN 2 HOURS IF NEEDED 12 tablet 3  . eletriptan (RELPAX) 40 MG tablet  TAKE 1 TABLET BY MOUTH ONCE DAILY AS NEEDED FOR MIGRAINE. REPEAT IN 2 HOURS IF NEEDED 12 tablet 0  . Ibuprofen (ADVIL MIGRAINE) 200 MG CAPS Take 400 mg by mouth daily as needed (migraines).    Marland Kitchen omeprazole (PRILOSEC) 20 MG capsule TAKE 1 CAPSULE BY MOUTH ONCE DAILY 90 capsule 0  . propranolol (INDERAL) 10 MG tablet TAKE 1 TABLET BY MOUTH TWICE DAILY 180 tablet 0  . venlafaxine XR (EFFEXOR-XR) 75 MG 24 hr capsule Take 1 capsule (75 mg total) by mouth daily. (Patient not taking: Reported on 07/05/2017) 90 capsule 0   No facility-administered medications prior to visit.     Allergies:  Allergies  Allergen Reactions  . Aspirin Other (See Comments)    G.I. Upset   . Demerol [Meperidine]     Gi upset,   . Morphine And Related Other (See Comments)    G.I. Upset  . Latex Itching and Rash      Review of Systems: See HPI for pertinent ROS. All other ROS negative.    Physical Exam: Blood pressure 132/84, pulse 92, temperature 98.3 F (36.8 C),  temperature source Oral, resp. rate 16, weight 77.9 kg (171 lb 12.8 oz), SpO2 97 %., Body mass index is 31.42 kg/m. General:  WNWD WF. Appears in no acute distress. HEENT: Normocephalic, atraumatic, eyes without discharge, sclera non-icteric, nares are without discharge. Bilateral auditory canals clear. Right TM clear, normal. Left TM--inflamed, portion is dull.  Oral cavity moist, posterior pharynx with mild erythema. No exudate, no peritonsillar abscess. Positive tenderness with percussion of bilateral maxillary sinuses.  Neck: Supple. No thyromegaly. Positive tenderness with palpation of anterior cervical nodes bilaterally.  Lungs: Clear bilaterally to auscultation without wheezes, rales, or rhonchi. Breathing is unlabored. Heart: Regular rhythm. No murmurs, rubs, or gallops. Msk:  Strength and tone normal for age. Extremities/Skin: Warm and dry. Neuro: Alert and oriented X 3. Moves all extremities spontaneously. Gait is normal. CNII-XII grossly in tact. Psych:  Responds to questions appropriately with a normal affect.   Results for orders placed or performed in visit on 07/05/17  STREP GROUP A AG, W/REFLEX TO CULT  Result Value Ref Range   Streptococcus, Group A Screen (Direct) NONE DETECTED   Influenza A and B Ag, Immunoassay  Result Value Ref Range   Source: NASOPHARYNX    INFLUENZA A ANTIGEN NOT DETECTED NOT DETECT   INFLUENZA B ANTIGEN NOT DETECTED NOT DETECT  COMMENT:       ASSESSMENT AND PLAN:  52 y.o. year old female with  1. Respiratory infection She is to take antibiotic and prednisone as directed. F/U if symptoms do not resolve upon completion of these.  Note given for out of work today and tomorrow. Return Wednesday  - azithromycin (ZITHROMAX) 250 MG tablet; Day 1: Take 2 daily. Days 2 - 5: Take 1 daily.  Dispense: 6 tablet; Refill: 0 - predniSONE (DELTASONE) 20 MG tablet; Take 3 daily for 2 days, then 2 daily for 2 days, then 1 daily for 2 days.  Dispense: 12 tablet;  Refill: 0  2. Acute maxillary sinusitis, recurrence not specified She is to take antibiotic and prednisone as directed. F/U if symptoms do not resolve upon completion of these.  Note given for out of work today and tomorrow. Return Wednesday   - azithromycin (ZITHROMAX) 250 MG tablet; Day 1: Take 2 daily. Days 2 - 5: Take 1 daily.  Dispense: 6 tablet; Refill: 0 - predniSONE (DELTASONE) 20 MG tablet; Take 3 daily for 2 days, then 2 daily for 2 days, then 1 daily for 2 days.  Dispense: 12 tablet; Refill: 0  3. Sore throat  - STREP GROUP A AG, W/REFLEX TO CULT  4. Generalized body aches  - Influenza A and B Ag, Immunoassay   Signed, [address] [place], [place], [facility] 07/05/2017 9:47 AM

## 2017-07-07 LAB — CULTURE, GROUP A STREP
MICRO NUMBER:: 90085172
SOURCE: 0
SPECIMEN QUALITY:: ADEQUATE

## 2017-07-07 LAB — STREP GROUP A AG, W/REFLEX TO CULT: Streptococcus, Group A Screen (Direct): NOT DETECTED

## 2017-07-07 NOTE — Telephone Encounter (Signed)
Patient called and left a message stating she was still sick and feels like she has gotten worse. Called was placed to patient lvm to call our office and schedule and appointment if she her symptoms had not improved.

## 2017-07-15 ENCOUNTER — Other Ambulatory Visit: Payer: Self-pay | Admitting: Physician Assistant

## 2017-07-16 ENCOUNTER — Ambulatory Visit: Payer: BLUE CROSS/BLUE SHIELD | Attending: Neurology | Admitting: Neurology

## 2017-07-16 DIAGNOSIS — G4733 Obstructive sleep apnea (adult) (pediatric): Secondary | ICD-10-CM | POA: Diagnosis not present

## 2017-07-21 ENCOUNTER — Other Ambulatory Visit (HOSPITAL_COMMUNITY)
Admission: RE | Admit: 2017-07-21 | Discharge: 2017-07-21 | Disposition: A | Discharge status: Home / Self Care | Payer: BLUE CROSS/BLUE SHIELD | Source: Ambulatory Visit | Attending: Neurology | Admitting: Neurology

## 2017-07-21 DIAGNOSIS — Z79899 Other long term (current) drug therapy: Secondary | ICD-10-CM | POA: Insufficient documentation

## 2017-07-21 DIAGNOSIS — G47419 Narcolepsy without cataplexy: Secondary | ICD-10-CM | POA: Diagnosis not present

## 2017-07-21 DIAGNOSIS — G4710 Hypersomnia, unspecified: Secondary | ICD-10-CM | POA: Diagnosis not present

## 2017-07-21 DIAGNOSIS — G608 Other hereditary and idiopathic neuropathies: Secondary | ICD-10-CM | POA: Diagnosis not present

## 2017-07-21 DIAGNOSIS — G4733 Obstructive sleep apnea (adult) (pediatric): Secondary | ICD-10-CM | POA: Diagnosis not present

## 2017-07-21 LAB — VITAMIN B12: Vitamin B-12: 343 pg/mL (ref 180–914)

## 2017-07-21 NOTE — Procedures (Signed)
[place] A. Merlene Laughter, MD     [email or web address]             NOCTURNAL POLYSOMNOGRAPHY   LOCATION: [facility]   Patient Name: Jessica Shaw, Jessica Shaw Date: 07/16/2017 Gender: Female D.O.B: 10/19/1965 Age (years): 79 Referring Provider: Barton Fanny NP Height (inches): 62 Interpreting Physician: Phillips Odor MD, ABSM Weight (lbs): 171 RPSGT: Peak, Robert BMI: 31 MRN: [id number] Neck Size: 16.00 <br> <br> CLINICAL INFORMATION Sleep Study Type: NPSG    Indication for sleep study: N/A    Epworth Sleepiness Score:    SLEEP STUDY TECHNIQUE As per the AASM Manual for the Scoring of Sleep and Associated Events v2.3 (April 2016) with a hypopnea requiring 4% desaturations.  The channels recorded and monitored were frontal, central and occipital EEG, electrooculogram (EOG), submentalis EMG (chin), nasal and oral airflow, thoracic and abdominal wall motion, anterior tibialis EMG, snore microphone, electrocardiogram, and pulse oximetry.  MEDICATIONS Medications self-administered by patient taken the night of the study : N/A  Current Outpatient Medications:  .  atorvastatin (LIPITOR) 80 MG tablet, TAKE 1 TABLET BY MOUTH ONCE DAILY, Disp: 90 tablet, Rfl: 0 .  azithromycin (ZITHROMAX) 250 MG tablet, Day 1: Take 2 daily. Days 2 - 5: Take 1 daily., Disp: 6 tablet, Rfl: 0 .  eletriptan (RELPAX) 40 MG tablet, TAKE ONE TABLET BY MOUTH ONCE DAILY AS NEEDED FOR MIGRAINE.  MAY REPEAT IN 2 HOURS IF NEEDED, Disp: 12 tablet, Rfl: 3 .  eletriptan (RELPAX) 40 MG tablet, TAKE 1 TABLET BY MOUTH ONCE DAILY AS NEEDED FOR  MIGRAINE  --  MAY  REPEAT  IN  2  HOURS  IF  NEEDED, Disp: 12 tablet, Rfl: 0 .  Ibuprofen (ADVIL MIGRAINE) 200 MG CAPS, Take 400 mg by mouth daily as needed (migraines)., Disp: , Rfl:  .  omeprazole (PRILOSEC) 20 MG capsule, TAKE 1 CAPSULE BY MOUTH ONCE DAILY, Disp: 90 capsule, Rfl: 0 .  predniSONE (DELTASONE) 20 MG tablet, Take 3 daily for 2 days, then 2  daily for 2 days, then 1 daily for 2 days., Disp: 12 tablet, Rfl: 0 .  propranolol (INDERAL) 10 MG tablet, TAKE 1 TABLET BY MOUTH TWICE DAILY, Disp: 180 tablet, Rfl: 0 .  venlafaxine XR (EFFEXOR-XR) 75 MG 24 hr capsule, Take 1 capsule (75 mg total) by mouth daily. (Patient not taking: Reported on 07/05/2017), Disp: 90 capsule, Rfl: 0    SLEEP ARCHITECTURE The study was initiated at 10:12:18 PM and ended at 5:04:30 AM.  Sleep onset time was 2.5 minutes and the sleep efficiency was 96.1%. The total sleep time was 396.2 minutes.  Stage REM latency was 59.0 minutes.  The patient spent 5.93% of the night in stage N1 sleep, 56.84% in stage N2 sleep, 2.40% in stage N3 and 34.83% in REM.  Alpha intrusion was absent.  Supine sleep was 0.00%.  RESPIRATORY PARAMETERS The overall apnea/hypopnea index (AHI) was 10 per hour. There were 2 total apneas, including 0 obstructive, 2 central and 0 mixed apneas. There were 63 hypopneas and 37 RERAs.  The AHI during Stage REM sleep was 21.7 per hour.  AHI while supine was N/A per hour.  The mean oxygen saturation was 93.85%. The minimum SpO2 during sleep was 87.00%.  moderate snoring was noted during this study.  CARDIAC DATA The 2 lead EKG demonstrated sinus rhythm. The mean heart rate was 86.27 beats per minute. Other EKG findings include: None. LEG MOVEMENT DATA The total PLMS were 0 with a resulting PLMS index  of 0.00. Associated arousal with leg movement index was 0.0.  IMPRESSIONS - Mild to moderate obstructive sleep apnea is documented. A trial of AutoPap 8- 14 is suggested.  Delano Metz, MD Diplomate, American Board of Sleep Medicine. ELECTRONICALLY SIGNED ON:  07/21/2017, 3:16 PM [place] PH: [phone]   FX: [phone] [place]

## 2017-07-22 LAB — VITAMIN D 25 HYDROXY (VIT D DEFICIENCY, FRACTURES): Vit D, 25-Hydroxy: 36.4 ng/mL (ref 30.0–100.0)

## 2017-07-23 LAB — METHYLMALONIC ACID, SERUM: Methylmalonic Acid, Quantitative: 205 nmol/L (ref 0–378)

## 2017-08-11 ENCOUNTER — Other Ambulatory Visit: Payer: Self-pay | Admitting: Physician Assistant

## 2017-08-11 DIAGNOSIS — N951 Menopausal and female climacteric states: Secondary | ICD-10-CM

## 2017-09-01 DIAGNOSIS — R569 Unspecified convulsions: Secondary | ICD-10-CM | POA: Diagnosis not present

## 2017-09-01 DIAGNOSIS — G4710 Hypersomnia, unspecified: Secondary | ICD-10-CM | POA: Diagnosis not present

## 2017-09-01 DIAGNOSIS — G608 Other hereditary and idiopathic neuropathies: Secondary | ICD-10-CM | POA: Diagnosis not present

## 2017-09-01 DIAGNOSIS — G47419 Narcolepsy without cataplexy: Secondary | ICD-10-CM | POA: Diagnosis not present

## 2017-09-08 ENCOUNTER — Other Ambulatory Visit: Payer: Self-pay | Admitting: Physician Assistant

## 2017-09-08 DIAGNOSIS — R1013 Epigastric pain: Secondary | ICD-10-CM

## 2017-09-08 NOTE — Telephone Encounter (Signed)
Refill appropriate 

## 2017-10-05 ENCOUNTER — Other Ambulatory Visit: Payer: Self-pay | Admitting: Physician Assistant

## 2017-10-05 DIAGNOSIS — E785 Hyperlipidemia, unspecified: Secondary | ICD-10-CM

## 2017-10-12 ENCOUNTER — Emergency Department (HOSPITAL_COMMUNITY)
Admission: EM | Admit: 2017-10-12 | Discharge: 2017-10-12 | Disposition: A | Discharge status: Home / Self Care | Payer: BLUE CROSS/BLUE SHIELD | Attending: Emergency Medicine | Admitting: Emergency Medicine

## 2017-10-12 ENCOUNTER — Other Ambulatory Visit: Payer: Self-pay

## 2017-10-12 ENCOUNTER — Encounter (HOSPITAL_COMMUNITY): Payer: Self-pay | Admitting: *Deleted

## 2017-10-12 ENCOUNTER — Emergency Department (HOSPITAL_COMMUNITY): Payer: BLUE CROSS/BLUE SHIELD

## 2017-10-12 DIAGNOSIS — E785 Hyperlipidemia, unspecified: Secondary | ICD-10-CM | POA: Insufficient documentation

## 2017-10-12 DIAGNOSIS — R002 Palpitations: Secondary | ICD-10-CM

## 2017-10-12 DIAGNOSIS — I493 Ventricular premature depolarization: Secondary | ICD-10-CM | POA: Insufficient documentation

## 2017-10-12 DIAGNOSIS — Z79899 Other long term (current) drug therapy: Secondary | ICD-10-CM | POA: Diagnosis not present

## 2017-10-12 DIAGNOSIS — E876 Hypokalemia: Secondary | ICD-10-CM

## 2017-10-12 DIAGNOSIS — Z91040 Latex allergy status: Secondary | ICD-10-CM | POA: Diagnosis not present

## 2017-10-12 DIAGNOSIS — R0602 Shortness of breath: Secondary | ICD-10-CM | POA: Diagnosis not present

## 2017-10-12 DIAGNOSIS — Z86718 Personal history of other venous thrombosis and embolism: Secondary | ICD-10-CM | POA: Diagnosis not present

## 2017-10-12 DIAGNOSIS — I491 Atrial premature depolarization: Secondary | ICD-10-CM | POA: Diagnosis not present

## 2017-10-12 DIAGNOSIS — F17210 Nicotine dependence, cigarettes, uncomplicated: Secondary | ICD-10-CM | POA: Insufficient documentation

## 2017-10-12 LAB — CBC
HCT: 41.9 % (ref 36.0–46.0)
HEMOGLOBIN: 14 g/dL (ref 12.0–15.0)
MCH: 29.9 pg (ref 26.0–34.0)
MCHC: 33.4 g/dL (ref 30.0–36.0)
MCV: 89.5 fL (ref 78.0–100.0)
PLATELETS: 303 10*3/uL (ref 150–400)
RBC: 4.68 MIL/uL (ref 3.87–5.11)
RDW: 12.9 % (ref 11.5–15.5)
WBC: 11.4 10*3/uL — ABNORMAL HIGH (ref 4.0–10.5)

## 2017-10-12 LAB — BASIC METABOLIC PANEL
ANION GAP: 12 (ref 5–15)
BUN: 15 mg/dL (ref 6–20)
CALCIUM: 9.6 mg/dL (ref 8.9–10.3)
CHLORIDE: 105 mmol/L (ref 101–111)
CO2: 23 mmol/L (ref 22–32)
CREATININE: 0.63 mg/dL (ref 0.44–1.00)
GFR calc Af Amer: >60 mL/min (ref >60)
GFR calc non Af Amer: >60 mL/min (ref >60)
Glucose, Bld: 107 mg/dL — ABNORMAL HIGH (ref 65–99)
Potassium: 3.4 mmol/L — ABNORMAL LOW (ref 3.5–5.1)
SODIUM: 140 mmol/L (ref 135–145)

## 2017-10-12 LAB — TROPONIN I: Troponin I: <0.03 ng/mL (ref <0.03)

## 2017-10-12 MED ORDER — METOPROLOL TARTRATE 25 MG PO TABS
25.0000 mg | ORAL_TABLET | Freq: Once | ORAL | Status: AC
  Administered 2017-10-12: 25 mg via ORAL
  Filled 2017-10-12: qty 1

## 2017-10-12 MED ORDER — METOPROLOL SUCCINATE ER 25 MG PO TB24: 25.0000 mg | ORAL_TABLET | Freq: Every day | ORAL | 0 refills | Status: DC

## 2017-10-12 MED ORDER — POTASSIUM CHLORIDE ER 10 MEQ PO TBCR: 10.0000 meq | EXTENDED_RELEASE_TABLET | Freq: Every day | ORAL | 0 refills | Status: DC

## 2017-10-12 MED ORDER — ONDANSETRON HCL 4 MG/2ML IJ SOLN
4.0000 mg | Freq: Once | INTRAMUSCULAR | Status: AC
  Administered 2017-10-12: 4 mg via INTRAVENOUS
  Filled 2017-10-12: qty 2

## 2017-10-12 MED ORDER — POTASSIUM CHLORIDE CRYS ER 20 MEQ PO TBCR
40.0000 meq | EXTENDED_RELEASE_TABLET | Freq: Once | ORAL | Status: AC
  Administered 2017-10-12: 40 meq via ORAL
  Filled 2017-10-12: qty 2

## 2017-10-12 NOTE — ED Triage Notes (Addendum)
Pt reports palpitations on and off since this morning. Pt reports sob, feeling light-headed, and nausea when this happens. Pt denies chest pain but states her chest at times feels like "a balloon is being blown up."

## 2017-10-12 NOTE — Discharge Instructions (Addendum)
Call Dr. Einar Gip for appintment. Take potassium each day. Take toprol as needed at night to prevent palpitations

## 2017-10-14 ENCOUNTER — Other Ambulatory Visit: Payer: Self-pay | Admitting: Physician Assistant

## 2017-10-14 DIAGNOSIS — G250 Essential tremor: Secondary | ICD-10-CM | POA: Diagnosis not present

## 2017-10-14 DIAGNOSIS — R002 Palpitations: Secondary | ICD-10-CM | POA: Diagnosis not present

## 2017-10-14 DIAGNOSIS — I493 Ventricular premature depolarization: Secondary | ICD-10-CM | POA: Diagnosis not present

## 2017-10-14 DIAGNOSIS — G4730 Sleep apnea, unspecified: Secondary | ICD-10-CM | POA: Diagnosis not present

## 2017-10-15 DIAGNOSIS — R002 Palpitations: Secondary | ICD-10-CM | POA: Diagnosis not present

## 2017-10-16 NOTE — ED Provider Notes (Signed)
[facility] EMERGENCY DEPARTMENT Provider Note   CSN: [id number] Arrival date & time: 10/12/17  2005     History   Chief Complaint Chief Complaint  Patient presents with  . Palpitations    HPI Jessica Shaw is a 52 y.o. female.  Chief complaint is palpitations  HPI 52 year old female.  Describes episodes where she will feel like her heart skips a beat or has an extra beat.  At times states she will feel like there is a balloon going off in her chest.  No sustained pressure dyspnea cough or shortness of breath.  For the last 2 to 3 days.  History of previous PE.  Apparently had a history of an irregular heartbeat followed by Dr. Einar Gip.  Past Medical History:  Diagnosis Date  . Complication of anesthesia   . Embolism - blood clot    arm  . GERD (gastroesophageal reflux disease)   . HLD (hyperlipidemia)   . Migraine   . PONV (postoperative nausea and vomiting)   . Smoker   . Stroke [facility])    TIA    Patient Active Problem List   Diagnosis Date Noted  . S/P patent foramen ovale closure 02/28/2015  . Chronic shoulder pain 07/24/2014  . HLD (hyperlipidemia) 08/16/2013  . Hot flashes, menopausal 08/14/2013  . Migraine   . Smoker 08/07/2013  . 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 GERD (gastroesophageal reflux disease) 09/22/2012    Past Surgical History:  Procedure Laterality Date  . CHOLECYSTECTOMY    . COLONOSCOPY  08/04/2005   DEY:CXKGY external hemorrhoids and anal papilla, otherwise normal  . COLONOSCOPY N/A 10/07/2012   Procedure: COLONOSCOPY;  Surgeon: Daneil Dolin, MD;  Location: [facility] ENDO SUITE;  Service: Endoscopy;  Laterality: N/A;  10:30  . COLONOSCOPY N/A 10/13/2016   Procedure: COLONOSCOPY;  Surgeon: Daneil Dolin, MD;  Location: [facility] ENDO SUITE;  Service: Endoscopy;  Laterality: N/A;  9:00am  . ESOPHAGOGASTRODUODENOSCOPY  04/20/2003   JEH:UDJS changes of reflux esophagitis/Limited gastroesophageal junction, otherwise normal  . ESOPHAGOGASTRODUODENOSCOPY N/A  10/07/2012   Procedure: ESOPHAGOGASTRODUODENOSCOPY (EGD);  Surgeon: Daneil Dolin, MD;  Location: [facility] ENDO SUITE;  Service: Endoscopy;  Laterality: N/A;  . PATENT FORAMEN OVALE CLOSURE    . POLYPECTOMY  10/13/2016   Procedure: POLYPECTOMY;  Surgeon: Daneil Dolin, MD;  Location: [facility] END[facility] SUITE;  Service: Endoscopy;;  colon  . SHOULDER ARTHROSCOPY     left  . SPINAL CORD STIMULATOR IMPLANT    . TUBAL LIGATION       OB History   None      Home Medications    Prior to Admission medications   Medication Sig Start Date End Date Taking? Authorizing Provider  atorvastatin (LIPITOR) 80 MG tablet TAKE 1 TABLET BY MOUTH ONCE DAILY 10/05/17   Dena Billet B, PA-C  azithromycin (ZITHROMAX) 250 MG tablet Day 1: Take 2 daily. Days 2 - 5: Take 1 daily. 07/05/17   Dixon, Stanton Kidney B, PA-C  eletriptan (RELPAX) 40 MG tablet TAKE ONE TABLET BY MOUTH ONCE DAILY AS NEEDED FOR MIGRAINE.  MAY REPEAT IN 2 HOURS IF NEEDED 04/13/17   Dena Billet B, PA-C  eletriptan (RELPAX) 40 MG tablet TAKE 1 TABLET BY MOUTH ONCE DAILY AS NEEDED FOR  MIGRAINE -- REPEAT IN 2 HOURS IF NEEDED 10/14/17   Orlena Sheldon, PA-C  Ibuprofen (ADVIL MIGRAINE) 200 MG CAPS Take 400 mg by mouth daily as needed (migraines).    [provider]  metoprolol succinate (TOPROL XL) 25  MG 24 hr tablet Take 1 tablet (25 mg total) by mouth daily. 1 p.o. nightly as needed palpitations 10/12/17   Tanna Furry, MD  omeprazole (PRILOSEC) 20 MG capsule TAKE 1 CAPSULE BY MOUTH ONCE DAILY 09/08/17   Dena Billet B, PA-C  potassium chloride (K-DUR) 10 MEQ tablet Take 1 tablet (10 mEq total) by mouth daily. 10/12/17   Tanna Furry, MD  predniSONE (DELTASONE) 20 MG tablet Take 3 daily for 2 days, then 2 daily for 2 days, then 1 daily for 2 days. 07/05/17   Dena Billet B, PA-C  propranolol (INDERAL) 10 MG tablet TAKE 1 TABLET BY MOUTH TWICE DAILY 03/19/17   Dena Billet B, PA-C  venlafaxine XR (EFFEXOR-XR) 75 MG 24 hr capsule Take 1 capsule (75 mg total) by mouth  daily. Patient not taking: Reported on 07/05/2017 06/28/17   Dena Billet B, PA-C  venlafaxine XR (EFFEXOR-XR) 75 MG 24 hr capsule TAKE 1 CAPSULE BY MOUTH ONCE DAILY WITH  BREAKFAST  *NEEDS  OFFICE  VISIT  BEFORE  MORE  REFILLS* 08/11/17   Orlena Sheldon, PA-C    Family History Family History  Problem Relation Age of Onset  . Diabetes Father   . Diabetes Brother   . Diabetes Brother   . Diabetes Brother   . Diabetes Brother   . Diabetes Brother   . Cancer Mother        unsure primary, deceased at age 10  . Diabetes Unknown   . Colon cancer Neg Hx     Social History Social History   Tobacco Use  . Smoking status: Current Every Day Smoker    Packs/day: 1.00    Years: 30.00    Pack years: 30.00    Types: Cigarettes  . Smokeless tobacco: Never Used  Substance Use Topics  . Alcohol use: No  . Drug use: No     Allergies   Aspirin; Demerol [meperidine]; Morphine and related; and Latex   Review of Systems Review of Systems  Constitutional: Negative for appetite change, chills, diaphoresis, fatigue and fever.  HENT: Negative for mouth sores, sore throat and trouble swallowing.   Eyes: Negative for visual disturbance.  Respiratory: Negative for cough, chest tightness, shortness of breath and wheezing.   Cardiovascular: Positive for palpitations. Negative for chest pain.  Gastrointestinal: Negative for abdominal distention, abdominal pain, diarrhea, nausea and vomiting.  Endocrine: Negative for polydipsia, polyphagia and polyuria.  Genitourinary: Negative for dysuria, frequency and hematuria.  Musculoskeletal: Negative for gait problem.  Skin: Negative for color change, pallor and rash.  Neurological: Negative for dizziness, syncope, light-headedness and headaches.  Hematological: Does not bruise/bleed easily.  Psychiatric/Behavioral: Negative for behavioral problems and confusion.     Physical Exam Updated Vital Signs BP 117/81   Pulse 83   Temp 98 F (36.7 C) (Oral)    Resp 13   Ht 5\' 2"  (1.575 m)   Wt 71.2 kg (157 lb)   SpO2 99%   BMI 28.72 kg/m   Physical Exam  Constitutional: She is oriented to person, place, and time. She appears well-developed and well-nourished. No distress.  HENT:  Head: Normocephalic.  Eyes: Pupils are equal, round, and reactive to light. Conjunctivae are normal. No scleral icterus.  Neck: Normal range of motion. Neck supple. No thyromegaly present.  Cardiovascular: Normal rate and regular rhythm. Exam reveals no gallop and no friction rub.  No murmur heard. Has occasional PACs, and PVCs.  These do reproduce her symptoms.  She does not have any  sustained ectopy or tachypalpitations.  Pulmonary/Chest: Effort normal and breath sounds normal. No respiratory distress. She has no wheezes. She has no rales.  Abdominal: Soft. Bowel sounds are normal. She exhibits no distension. There is no tenderness. There is no rebound.  Musculoskeletal: Normal range of motion.  Neurological: She is alert and oriented to person, place, and time.  Skin: Skin is warm and dry. No rash noted.  Psychiatric: She has a normal mood and affect. Her behavior is normal.     ED Treatments / Results  Labs (all labs ordered are listed, but only abnormal results are displayed) Labs Reviewed  BASIC METABOLIC PANEL - Abnormal; Notable for the following components:      Result Value   Potassium 3.4 (*)    Glucose, Bld 107 (*)    All other components within normal limits  CBC - Abnormal; Notable for the following components:   WBC 11.4 (*)    All other components within normal limits  TROPONIN I    EKG EKG Interpretation  Date/Time:  Tuesday October 12 2017 20:44:43 EDT Ventricular Rate:  84 PR Interval:    QRS Duration: 91 QT Interval:  388 QTC Calculation: 459 R Axis:   47 Text Interpretation:  Sinus rhythm Ventricular trigeminy Sinus pause Low voltage, precordial leads Confirmed by Tanna Furry [phone]) on 10/13/2017 5:08:05 PM   Radiology No  results found.  Procedures Procedures (including critical care time)  Medications Ordered in ED Medications  ondansetron (ZOFRAN) injection 4 mg (4 mg Intravenous Given 10/12/17 2045)  potassium chloride SA (K-DUR,KLOR-CON) CR tablet 40 mEq (40 mEq Oral Given 10/12/17 2123)  metoprolol tartrate (LOPRESSOR) tablet 25 mg (25 mg Oral Given 10/12/17 2148)     Initial Impression / Assessment and Plan / ED Course  I have reviewed the triage vital signs and the nursing notes.  Pertinent labs & imaging results that were available during my care of the patient were reviewed by me and considered in my medical decision making (see chart for details).     EKG Interpretation  Date/Time:  Tuesday October 12 2017 20:44:43 EDT Ventricular Rate:  84 PR Interval:    QRS Duration: 91 QT Interval:  388 QTC Calculation: 459 R Axis:   47 Text Interpretation:  Sinus rhythm Ventricular trigeminy Sinus pause Low voltage, precordial leads Confirmed by Tanna Furry [phone]) on 10/13/2017 5:08:05 PM    Given potassium replacement.  Given p.o. metoprolol.  She is so subjectively aware of her PACs and PVCs that they are distracting and bothersome to her.  Plan will be in daily her p.m. dose of beta-blocker until she can follow-up with her cardiologist.  Daily potassium replacement.  Return to ER with new or worsening symptoms.    Final Clinical Impressions(s) / ED Diagnoses   Final diagnoses:  Palpitations  PVC (premature ventricular contraction)  Premature atrial contraction  Hypokalemia    ED Discharge Orders        Ordered    potassium chloride (K-DUR) 10 MEQ tablet  Daily     10/12/17 2123    metoprolol succinate (TOPROL XL) 25 MG 24 hr tablet  Daily     10/12/17 2123       Tanna Furry, MD 10/16/17 [phone]

## 2017-10-18 ENCOUNTER — Other Ambulatory Visit (HOSPITAL_BASED_OUTPATIENT_CLINIC_OR_DEPARTMENT_OTHER): Payer: Self-pay

## 2017-10-18 DIAGNOSIS — G4733 Obstructive sleep apnea (adult) (pediatric): Secondary | ICD-10-CM

## 2017-10-18 DIAGNOSIS — G47419 Narcolepsy without cataplexy: Secondary | ICD-10-CM

## 2017-10-20 ENCOUNTER — Other Ambulatory Visit: Payer: Self-pay

## 2017-10-20 ENCOUNTER — Ambulatory Visit: Payer: BLUE CROSS/BLUE SHIELD | Admitting: Physician Assistant

## 2017-10-20 ENCOUNTER — Encounter: Payer: Self-pay | Admitting: Physician Assistant

## 2017-10-20 VITALS — BP 124/82 | HR 67 | Temp 98.0°F | Resp 14 | Ht 62.0 in | Wt 158.8 lb

## 2017-10-20 DIAGNOSIS — F17200 Nicotine dependence, unspecified, uncomplicated: Secondary | ICD-10-CM

## 2017-10-20 DIAGNOSIS — K219 Gastro-esophageal reflux disease without esophagitis: Secondary | ICD-10-CM | POA: Diagnosis not present

## 2017-10-20 DIAGNOSIS — R739 Hyperglycemia, unspecified: Secondary | ICD-10-CM | POA: Diagnosis not present

## 2017-10-20 DIAGNOSIS — N951 Menopausal and female climacteric states: Secondary | ICD-10-CM

## 2017-10-20 DIAGNOSIS — E785 Hyperlipidemia, unspecified: Secondary | ICD-10-CM | POA: Diagnosis not present

## 2017-10-20 DIAGNOSIS — G43009 Migraine without aura, not intractable, without status migrainosus: Secondary | ICD-10-CM

## 2017-10-20 DIAGNOSIS — Z8774 Personal history of (corrected) congenital malformations of heart and circulatory system: Secondary | ICD-10-CM
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
## 2017-10-22 LAB — LIPID PANEL
Cholesterol: 148 mg/dL (ref <200)
HDL: 39 mg/dL — AB (ref >50)
LDL CHOLESTEROL (CALC): 86 mg/dL
Non-HDL Cholesterol (Calc): 109 mg/dL (calc) (ref <130)
TRIGLYCERIDES: 132 mg/dL (ref <150)
Total CHOL/HDL Ratio: 3.8 (calc) (ref <5.0)

## 2017-10-22 LAB — COMPLETE METABOLIC PANEL WITH GFR
AG RATIO: 1.8 (calc) (ref 1.0–2.5)
ALT: 22 U/L (ref 6–29)
AST: 15 U/L (ref 10–35)
Albumin: 4.4 g/dL (ref 3.6–5.1)
Alkaline phosphatase (APISO): 74 U/L (ref 33–130)
BILIRUBIN TOTAL: 0.5 mg/dL (ref 0.2–1.2)
BUN/Creatinine Ratio: 34 (calc) — ABNORMAL HIGH (ref 6–22)
BUN: 26 mg/dL — AB (ref 7–25)
CALCIUM: 10 mg/dL (ref 8.6–10.4)
CHLORIDE: 104 mmol/L (ref 98–110)
CO2: 26 mmol/L (ref 20–32)
Creat: 0.77 mg/dL (ref 0.50–1.05)
GFR, EST AFRICAN AMERICAN: 104 mL/min/{1.73_m2} (ref >=60)
GFR, EST NON AFRICAN AMERICAN: 90 mL/min/{1.73_m2} (ref >=60)
GLUCOSE: 109 mg/dL — AB (ref 65–99)
Globulin: 2.4 g/dL (calc) (ref 1.9–3.7)
POTASSIUM: 4.8 mmol/L (ref 3.5–5.3)
Sodium: 140 mmol/L (ref 135–146)
TOTAL PROTEIN: 6.8 g/dL (ref 6.1–8.1)

## 2017-10-22 LAB — HEMOGLOBIN A1C W/OUT EAG: HEMOGLOBIN A1C: 5.8 %{Hb} — AB (ref <5.7)

## 2017-10-22 LAB — TEST AUTHORIZATION

## 2017-10-22 LAB — EXTRA LAV TOP TUBE

## 2017-11-04 ENCOUNTER — Other Ambulatory Visit: Payer: Self-pay | Admitting: Physician Assistant

## 2017-11-04 DIAGNOSIS — I493 Ventricular premature depolarization: Secondary | ICD-10-CM | POA: Diagnosis not present

## 2017-11-04 DIAGNOSIS — R002 Palpitations: Secondary | ICD-10-CM | POA: Diagnosis not present

## 2017-11-04 DIAGNOSIS — E785 Hyperlipidemia, unspecified: Secondary | ICD-10-CM

## 2017-11-15 DIAGNOSIS — Z8774 Personal history of (corrected) congenital malformations of heart and circulatory system: Secondary | ICD-10-CM | POA: Diagnosis not present

## 2017-11-15 DIAGNOSIS — G250 Essential tremor: Secondary | ICD-10-CM | POA: Diagnosis not present

## 2017-11-15 DIAGNOSIS — I493 Ventricular premature depolarization: Secondary | ICD-10-CM | POA: Diagnosis not present

## 2017-11-15 DIAGNOSIS — G4733 Obstructive sleep apnea (adult) (pediatric): Secondary | ICD-10-CM | POA: Diagnosis not present

## 2017-11-15 DIAGNOSIS — R002 Palpitations: Secondary | ICD-10-CM | POA: Diagnosis not present

## 2017-11-17 ENCOUNTER — Other Ambulatory Visit: Payer: Self-pay | Admitting: Physician Assistant

## 2017-11-22 ENCOUNTER — Encounter (HOSPITAL_BASED_OUTPATIENT_CLINIC_OR_DEPARTMENT_OTHER): Payer: BLUE CROSS/BLUE SHIELD

## 2017-11-23 ENCOUNTER — Encounter (HOSPITAL_BASED_OUTPATIENT_CLINIC_OR_DEPARTMENT_OTHER): Payer: BLUE CROSS/BLUE SHIELD

## 2017-11-24 ENCOUNTER — Encounter: Payer: Self-pay | Admitting: Physician Assistant

## 2017-11-24 ENCOUNTER — Ambulatory Visit: Payer: BLUE CROSS/BLUE SHIELD | Admitting: Physician Assistant

## 2017-11-24 VITALS — BP 116/80 | HR 74 | Temp 97.9°F | Resp 14 | Ht 62.0 in | Wt 157.0 lb

## 2017-11-24 DIAGNOSIS — M25519 Pain in unspecified shoulder: Secondary | ICD-10-CM | POA: Diagnosis not present

## 2017-11-24 DIAGNOSIS — G8929 Other chronic pain: Secondary | ICD-10-CM

## 2017-11-24 DIAGNOSIS — M791 Myalgia, unspecified site: Secondary | ICD-10-CM

## 2017-11-24 NOTE — Progress Notes (Signed)
Patient ID: Jessica Shaw MRN: [id number], DOB: [birth year], 52 y.o. Date of Encounter: 11/24/2017, 4:56 PM    Chief Complaint:  Chief Complaint  Patient presents with  . Back Pain    top towards the middle,right side throat hurting as well as right below the clavical bone     HPI: 52 y.o. year old female presents with above.   She points to right anterior chest just medial to the right axilla and says that that area has been sore.  When I palpate that area it is extremely tender with palpation.  I palpated the same area on her left chest and has no tenderness on that side. She then points to her right scapula and says that she has been having pain there as well.  I palpated that area and it is extremely tender with palpation.  I discussed that this seems to be musculoskeletal and secondary to sore muscles that is causing these areas of pain. He says that she just got worried because it had been bothering her for a few weeks and she could not think of anything she had done to cause it.  Says that then the last couple of mornings her throat has felt a little sore so then that got her concerned that something was wrong.  Says that her mother had cancer in her lung and that she kept going to the doctor thinking that her shoulder hurt and even saw orthopedics etc. and finally an x-ray that showed lung cancer so she was afraid that maybe she had cancer causing this.  In regards to the sore throat says that it has been very mild and she is mostly noticed it when she has woken up in the mornings for the last couple of mornings.  Says that at this time throat really does not feel sore.  Says that she did just start using CPAP and is wondering if that is causing it.  I asked if she had been having any runny nose or mucus from the nose or cough or chest congestion.  And says that while yes she has had a little bit of a runny nose for a couple of days also.  No fevers or chills.     Home Meds:   Outpatient Medications Prior to Visit  Medication Sig Dispense Refill  . atorvastatin (LIPITOR) 80 MG tablet TAKE 1 TABLET BY MOUTH ONCE DAILY 90 tablet 0  . eletriptan (RELPAX) 40 MG tablet TAKE ONE TABLET BY MOUTH ONCE DAILY AS NEEDED FOR MIGRAINE.  MAY REPEAT IN 2 HOURS IF NEEDED 12 tablet 3  . Ibuprofen (ADVIL MIGRAINE) 200 MG CAPS Take 400 mg by mouth daily as needed (migraines).    Marland Kitchen omeprazole (PRILOSEC) 20 MG capsule TAKE 1 CAPSULE BY MOUTH ONCE DAILY 90 capsule 0  . propranolol (INDERAL) 10 MG tablet TAKE 1 TABLET BY MOUTH TWICE DAILY (Patient taking differently: patient taking 120 mg) 180 tablet 0  . eletriptan (RELPAX) 40 MG tablet TAKE 1 TABLET BY MOUTH ONCE DAILY AS NEEDED FOR  MIGRAINE -- REPEAT IN 2 HOURS IF NEEDED 12 tablet 0  . venlafaxine XR (EFFEXOR-XR) 75 MG 24 hr capsule Take 1 capsule (75 mg total) by mouth daily. (Patient not taking: Reported on 07/05/2017) 90 capsule 0   No facility-administered medications prior to visit.     Allergies:  Allergies  Allergen Reactions  . Aspirin Other (See Comments)    G.I. Upset   . Demerol [Meperidine]  Gi upset,   . Morphine And Related Other (See Comments)    G.I. Upset  . Latex Itching and Rash      Review of Systems: See HPI for pertinent ROS. All other ROS negative.    Physical Exam: Blood pressure 116/80, pulse 74, temperature 97.9 F (36.6 C), temperature source Oral, resp. rate 14, height 5\' 2"  (1.575 m), weight 71.2 kg (157 lb), SpO2 100 %., Body mass index is 28.72 kg/m. General:  WF. Appears in no acute distress. HEENT: Normocephalic, atraumatic, eyes without discharge, sclera non-icteric, nares are without discharge. Bilateral auditory canals clear, TM's are without perforation, pearly grey and translucent with reflective cone of light bilaterally. Oral cavity moist, posterior pharynx without exudate, erythema, peritonsillar abscess.  Neck: Supple. No thyromegaly. No lymphadenopathy. Lungs: Clear  bilaterally to auscultation without wheezes, rales, or rhonchi. Breathing is unlabored. Heart: Regular rhythm. No murmurs, rubs, or gallops. Msk:  Strength and tone normal for age.  When I palpate that area it is extremely tender with palpation.  I palpated the same area on her left chest and has no tenderness on that side. Right scapula--When I palpate along the medial border---  it is extremely tender with palpation. Extremities/Skin: Warm and dry.  Neuro: Alert and oriented X 3. Moves all extremities spontaneously. Gait is normal. CNII-XII grossly in tact. Psych:  Responds to questions appropriately with a normal affect.     ASSESSMENT AND PLAN:  52 y.o. year old female with  1. Muscle pain I discussed with her that her exam findings are consistent with musculoskeletal pain. Discussed that even if she did have lung cancer it would not be sore with palpation in these areas. I was going to go ahead and get a chest x-ray for further reassurance but then I reviewed chart and saw that she had chest x-ray 10/12/2017 that showed no cardiopulmonary disease--lungs clear bilaterally. He states that she has Advil at home but has not been using it.  I told her to take Advil with food on a routine basis for several days.  Also apply heat and stretch.  She says that she does not want any kind of prescription medicines to use for this.  2. Chronic shoulder pain, unspecified laterality See above information.  Also when I was reviewing her chart for the chest x-ray from 10/12/2017 also saw that she had x-ray of right shoulder last year.  She then says that over year she has been having problems with her shoulders.  Has had multiple surgeries to the left shoulder.  I reviewed that if there are anatomical issues with the right shoulder that could be leading to these areas of pain as all of these muscles and ligaments and tendons are interconnected.  Treatment as above.  In regards to her sore throat discussed  that this may be related to the CPAP machine or could be from allergies or a virus.  She is to monitor this and if it worsens or did she develops thick dark mucus or fever then will follow up.   [address] [place], [place], [facility] 11/24/2017 4:56 PM

## 2017-12-10 ENCOUNTER — Other Ambulatory Visit: Payer: Self-pay | Admitting: Physician Assistant

## 2017-12-10 DIAGNOSIS — R1013 Epigastric pain: Secondary | ICD-10-CM

## 2017-12-15 DIAGNOSIS — G4733 Obstructive sleep apnea (adult) (pediatric): Secondary | ICD-10-CM | POA: Diagnosis not present

## 2018-01-03 ENCOUNTER — Other Ambulatory Visit: Payer: Self-pay | Admitting: Physician Assistant

## 2018-01-15 ENCOUNTER — Emergency Department (HOSPITAL_COMMUNITY): Payer: BLUE CROSS/BLUE SHIELD

## 2018-01-15 ENCOUNTER — Encounter (HOSPITAL_COMMUNITY): Payer: Self-pay | Admitting: Emergency Medicine

## 2018-01-15 ENCOUNTER — Emergency Department (HOSPITAL_COMMUNITY)
Admission: EM | Admit: 2018-01-15 | Discharge: 2018-01-15 | Disposition: A | Discharge status: Home / Self Care | Payer: BLUE CROSS/BLUE SHIELD | Attending: Emergency Medicine | Admitting: Emergency Medicine

## 2018-01-15 ENCOUNTER — Other Ambulatory Visit: Payer: Self-pay

## 2018-01-15 DIAGNOSIS — R109 Unspecified abdominal pain: Secondary | ICD-10-CM | POA: Diagnosis not present

## 2018-01-15 DIAGNOSIS — R1084 Generalized abdominal pain: Secondary | ICD-10-CM | POA: Diagnosis not present

## 2018-01-15 DIAGNOSIS — G4733 Obstructive sleep apnea (adult) (pediatric): Secondary | ICD-10-CM | POA: Diagnosis not present

## 2018-01-15 DIAGNOSIS — Z91040 Latex allergy status: Secondary | ICD-10-CM | POA: Insufficient documentation

## 2018-01-15 DIAGNOSIS — Z8673 Personal history of transient ischemic attack (TIA), and cerebral infarction without residual deficits: Secondary | ICD-10-CM | POA: Insufficient documentation

## 2018-01-15 DIAGNOSIS — F17210 Nicotine dependence, cigarettes, uncomplicated: Secondary | ICD-10-CM | POA: Diagnosis not present

## 2018-01-15 DIAGNOSIS — Z79899 Other long term (current) drug therapy: Secondary | ICD-10-CM | POA: Insufficient documentation

## 2018-01-15 LAB — CBC WITH DIFFERENTIAL/PLATELET
BASOS PCT: 1 %
Basophils Absolute: 0.1 10*3/uL (ref 0.0–0.1)
EOS ABS: 0.3 10*3/uL (ref 0.0–0.7)
EOS PCT: 3 %
HCT: 44.2 % (ref 36.0–46.0)
Hemoglobin: 14.9 g/dL (ref 12.0–15.0)
Lymphocytes Relative: 42 %
Lymphs Abs: 4.9 10*3/uL — ABNORMAL HIGH (ref 0.7–4.0)
MCH: 30.2 pg (ref 26.0–34.0)
MCHC: 33.7 g/dL (ref 30.0–36.0)
MCV: 89.5 fL (ref 78.0–100.0)
Monocytes Absolute: 0.6 10*3/uL (ref 0.1–1.0)
Monocytes Relative: 5 %
NEUTROS PCT: 49 %
Neutro Abs: 5.6 10*3/uL (ref 1.7–7.7)
PLATELETS: 283 10*3/uL (ref 150–400)
RBC: 4.94 MIL/uL (ref 3.87–5.11)
RDW: 13.3 % (ref 11.5–15.5)
WBC: 11.5 10*3/uL — AB (ref 4.0–10.5)

## 2018-01-15 LAB — COMPREHENSIVE METABOLIC PANEL
ALBUMIN: 4.2 g/dL (ref 3.5–5.0)
ALK PHOS: 75 U/L (ref 38–126)
ALT: 21 U/L (ref 0–44)
ANION GAP: 7 (ref 5–15)
AST: 17 U/L (ref 15–41)
BUN: 14 mg/dL (ref 6–20)
CALCIUM: 9.4 mg/dL (ref 8.9–10.3)
CHLORIDE: 105 mmol/L (ref 98–111)
CO2: 28 mmol/L (ref 22–32)
Creatinine, Ser: 0.64 mg/dL (ref 0.44–1.00)
GFR calc Af Amer: >60 mL/min (ref >60)
GFR calc non Af Amer: >60 mL/min (ref >60)
GLUCOSE: 99 mg/dL (ref 70–99)
Potassium: 3.4 mmol/L — ABNORMAL LOW (ref 3.5–5.1)
SODIUM: 140 mmol/L (ref 135–145)
Total Bilirubin: 0.7 mg/dL (ref 0.3–1.2)
Total Protein: 6.9 g/dL (ref 6.5–8.1)

## 2018-01-15 LAB — URINALYSIS, ROUTINE W REFLEX MICROSCOPIC
BILIRUBIN URINE: NEGATIVE
Glucose, UA: NEGATIVE mg/dL
KETONES UR: NEGATIVE mg/dL
Leukocytes, UA: NEGATIVE
Nitrite: NEGATIVE
PROTEIN: NEGATIVE mg/dL
Specific Gravity, Urine: 1.014 (ref 1.005–1.030)
pH: 5 (ref 5.0–8.0)

## 2018-01-15 LAB — LIPASE, BLOOD: Lipase: 28 U/L (ref 11–51)

## 2018-01-15 LAB — PREGNANCY, URINE: Preg Test, Ur: NEGATIVE

## 2018-01-15 MED ORDER — KETOROLAC TROMETHAMINE 30 MG/ML IJ SOLN
30.0000 mg | Freq: Once | INTRAMUSCULAR | Status: AC
  Administered 2018-01-15: 30 mg via INTRAVENOUS
  Filled 2018-01-15: qty 1

## 2018-01-15 MED ORDER — ONDANSETRON HCL 4 MG/2ML IJ SOLN
4.0000 mg | Freq: Once | INTRAMUSCULAR | Status: AC
  Administered 2018-01-15: 4 mg via INTRAVENOUS
  Filled 2018-01-15: qty 2

## 2018-01-15 MED ORDER — SODIUM CHLORIDE 0.9 % IV BOLUS
1000.0000 mL | Freq: Once | INTRAVENOUS | Status: AC
  Administered 2018-01-15: 1000 mL via INTRAVENOUS

## 2018-01-15 MED ORDER — FENTANYL CITRATE (PF) 100 MCG/2ML IJ SOLN
50.0000 ug | Freq: Once | INTRAMUSCULAR | Status: AC
Start: 2018-01-15 — End: 2018-01-15
  Administered 2018-01-15: 50 ug via INTRAVENOUS
  Filled 2018-01-15: qty 2

## 2018-01-15 MED ORDER — IOPAMIDOL (ISOVUE-300) INJECTION 61%
100.0000 mL | Freq: Once | INTRAVENOUS | Status: AC | PRN
  Administered 2018-01-15: 100 mL via INTRAVENOUS

## 2018-01-15 NOTE — ED Provider Notes (Signed)
[facility] EMERGENCY DEPARTMENT Provider Note   CSN: [id number] Arrival date & time: 01/15/18  1857     History   Chief Complaint Chief Complaint  Patient presents with  . Flank Pain    HPI JOHNIE STADEL is a 52 y.o. female.  She has a prior history of a stroke and cholecystectomy.  She is complaining of right-sided abdominal pain 7 out of 10 intensity radiating through to her back since about 1 PM yesterday.  It is not associated with any nausea vomiting diarrhea dysuria.  There is been no fever or chills.  No cough or shortness of breath.  She cannot think of anything different she did or ate and no history of any trauma there.  She is tried nothing for it.  The history is provided by the patient.  Flank Pain  This is a new problem. The current episode started yesterday. The problem occurs constantly. The problem has not changed since onset.Associated symptoms include abdominal pain. Pertinent negatives include no chest pain, no headaches and no shortness of breath. Nothing aggravates the symptoms. Nothing relieves the symptoms. She has tried nothing for the symptoms. The treatment provided no relief.    Past Medical History:  Diagnosis Date  . Complication of anesthesia   . Embolism - blood clot    arm  . GERD (gastroesophageal reflux disease)   . HLD (hyperlipidemia)   . Migraine   . PONV (postoperative nausea and vomiting)   . Smoker   . Stroke [facility])    TIA    Patient Active Problem List   Diagnosis Date Noted  . S/P patent foramen ovale closure 02/28/2015  . Chronic shoulder pain 07/24/2014  . HLD (hyperlipidemia) 08/16/2013  . Hot flashes, menopausal 08/14/2013  . Migraine   . Smoker 08/07/2013  . 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 GERD (gastroesophageal reflux disease) 09/22/2012    Past Surgical History:  Procedure Laterality Date  . CHOLECYSTECTOMY    . COLONOSCOPY  08/04/2005   MHD:QQIWL external hemorrhoids and anal papilla, otherwise normal  .  COLONOSCOPY N/A 10/07/2012   Procedure: COLONOSCOPY;  Surgeon: Daneil Dolin, MD;  Location: [facility];  Service: Endoscopy;  Laterality: N/A;  10:30  . COLONOSCOPY N/A 10/13/2016   Procedure: COLONOSCOPY;  Surgeon: Daneil Dolin, MD;  Location: [facility] END[facility];  Service: Endoscopy;  Laterality: N/A;  9:00am  . ESOPHAGOGASTRODUODENOSCOPY  04/20/2003   NLG:XQJJ changes of reflux esophagitis/Limited gastroesophageal junction, otherwise normal  . ESOPHAGOGASTRODUODENOSCOPY N/A 10/07/2012   Procedure: ESOPHAGOGASTRODUODENOSCOPY (EGD);  Surgeon: Daneil Dolin, MD;  Location: [facility];  Service: Endoscopy;  Laterality: N/A;  . PATENT FORAMEN OVALE CLOSURE    . POLYPECTOMY  10/13/2016   Procedure: POLYPECTOMY;  Surgeon: Daneil Dolin, MD;  Location: [facility];  Service: Endoscopy;;  colon  . SHOULDER ARTHROSCOPY     left  . SPINAL CORD STIMULATOR IMPLANT    . TUBAL LIGATION       OB History   None      Home Medications    Prior to Admission medications   Medication Sig Start Date End Date Taking? Authorizing Provider  atorvastatin (LIPITOR) 80 MG tablet TAKE 1 TABLET BY MOUTH ONCE DAILY 11/04/17   Dena Billet B, PA-C  eletriptan (RELPAX) 40 MG tablet TAKE ONE TABLET BY MOUTH ONCE DAILY AS NEEDED FOR MIGRAINE.  MAY REPEAT IN 2 HOURS IF NEEDED 04/13/17   Dena Billet B, PA-C  eletriptan (RELPAX) 40 MG tablet TAKE  1 TABLET BY MOUTH ONCE DAILY AS NEEDED FOR  MIGRAINE  **REPEAT  IN  2  HOURS  IF  NEEDED** 01/03/18   Orlena Sheldon, PA-C  Ibuprofen (ADVIL MIGRAINE) 200 MG CAPS Take 400 mg by mouth daily as needed (migraines).    [provider]  omeprazole (PRILOSEC) 20 MG capsule TAKE 1 CAPSULE BY MOUTH ONCE DAILY 12/10/17   Alycia Rossetti, MD  propranolol (INDERAL) 10 MG tablet TAKE 1 TABLET BY MOUTH TWICE DAILY Patient taking differently: patient taking 120 mg 03/19/17   Orlena Sheldon, PA-C    Family History Family History  Problem Relation Age of Onset  . Diabetes Father    . Diabetes Brother   . Diabetes Brother   . Diabetes Brother   . Diabetes Brother   . Diabetes Brother   . Cancer Mother        unsure primary, deceased at age 97  . Diabetes Unknown   . Colon cancer Neg Hx     Social History Social History   Tobacco Use  . Smoking status: Current Every Day Smoker    Packs/day: 1.00    Years: 30.00    Pack years: 30.00    Types: Cigarettes  . Smokeless tobacco: Never Used  Substance Use Topics  . Alcohol use: No  . Drug use: No     Allergies   Aspirin; Demerol [meperidine]; Morphine and related; and Latex   Review of Systems Review of Systems  Constitutional: Negative for fever.  HENT: Negative for sore throat.   Eyes: Negative for visual disturbance.  Respiratory: Negative for shortness of breath.   Cardiovascular: Negative for chest pain.  Gastrointestinal: Positive for abdominal pain. Negative for constipation, diarrhea, nausea and vomiting.  Genitourinary: Positive for flank pain. Negative for dysuria, hematuria and vaginal bleeding.  Musculoskeletal: Negative for neck pain.  Skin: Negative for rash.  Neurological: Negative for headaches.     Physical Exam Updated Vital Signs BP (!) 152/81 (BP Location: Right Arm)   Pulse 89   Temp 97.8 F (36.6 C) (Oral)   Resp 17   Ht 5\' 2"  (1.575 m)   Wt 68.5 kg (151 lb)   SpO2 99%   BMI 27.62 kg/m   Physical Exam  Constitutional: She appears well-developed and well-nourished. No distress.  HENT:  Head: Normocephalic and atraumatic.  Eyes: Conjunctivae are normal.  Neck: Neck supple.  Cardiovascular: Normal rate, regular rhythm, normal heart sounds and intact distal pulses.  No murmur heard. Pulmonary/Chest: Effort normal and breath sounds normal. No respiratory distress.  Abdominal: Soft. She exhibits no mass. There is no tenderness. There is no guarding.  Musculoskeletal: She exhibits no edema, tenderness or deformity.  Neurological: She is alert. She has normal  strength. No sensory deficit. GCS eye subscore is 4. GCS verbal subscore is 5. GCS motor subscore is 6.  Skin: Skin is warm and dry.  Psychiatric: She has a normal mood and affect.  Nursing note and vitals reviewed.    ED Treatments / Results  Labs (all labs ordered are listed, but only abnormal results are displayed) Labs Reviewed  URINALYSIS, ROUTINE W REFLEX MICROSCOPIC - Abnormal; Notable for the following components:      Result Value   APPearance HAZY (*)    Hgb urine dipstick SMALL (*)    Bacteria, UA RARE (*)    All other components within normal limits  CBC WITH DIFFERENTIAL/PLATELET - Abnormal; Notable for the following components:   WBC 11.5 (*)  Lymphs Abs 4.9 (*)    All other components within normal limits  COMPREHENSIVE METABOLIC PANEL - Abnormal; Notable for the following components:   Potassium 3.4 (*)    All other components within normal limits  PREGNANCY, URINE  LIPASE, BLOOD    EKG None  Radiology Ct Abdomen Pelvis W Contrast  Result Date: 01/15/2018 CLINICAL DATA:  RIGHT flank pain, upper and lower abdominal pain question appendicitis, smoker, GERD EXAM: CT ABDOMEN AND PELVIS WITH CONTRAST TECHNIQUE: Multidetector CT imaging of the abdomen and pelvis was performed using the standard protocol following bolus administration of intravenous contrast. Sagittal and coronal MPR images reconstructed from axial data set. CONTRAST:  128mL ISOVUE-300 IOPAMIDOL (ISOVUE-300) INJECTION 61% IV. No oral contrast. COMPARISON:  12/13/2007 FINDINGS: Lower chest: Dependent atelectasis in BILATERAL lower lobes. Hepatobiliary: Gallbladder surgically absent. Liver unremarkable. Minimally prominent CBD 12 mm diameter previously 11 mm. Pancreas: Normal appearance Spleen: Normal appearance.  Small splenule anterior to spleen. Adrenals/Urinary Tract: Adrenal glands, kidneys, ureters, and bladder normal appearance. No definite urinary tract calcification or dilatation. Stomach/Bowel:  Normal appendix. Stomach and bowel loops normal appearance Vascular/Lymphatic: Few pelvic phleboliths. Atherosclerotic calcification aorta without aneurysm. No adenopathy. Reproductive: Unremarkable uterus and ovaries Other: No free air or free fluid. No hernia or acute inflammatory process. Intraspinal stimulator generator noted in the lower RIGHT lumbar region. Musculoskeletal: Unremarkable IMPRESSION: Mild extrahepatic biliary dilatation post cholecystectomy, potentially physiologic, little changed, correlate with LFTs. No acute intra-abdominal or intrapelvic abnormalities. Electronically Signed   By: Lavonia Dana [name].   On: 01/15/2018 21:51    Procedures Procedures (including critical care time)  Medications Ordered in ED Medications  sodium chloride 0.9 % bolus 1,000 mL (has no administration in time range)  fentaNYL (SUBLIMAZE) injection 50 mcg (has no administration in time range)  ondansetron (ZOFRAN) injection 4 mg (has no administration in time range)     Initial Impression / Assessment and Plan / ED Course  I have reviewed the triage vital signs and the nursing notes.  Pertinent labs & imaging results that were available during my care of the patient were reviewed by me and considered in my medical decision making (see chart for details).  Clinical Course as of Jan 17 1040  Sat Jan 15, 2018  2231 Patient's lab work and urinalysis have been fairly unremarkable.  There is some trace RBCs in her urine.  Her CAT scan also did not show any obvious findings.   [MB]  2231 CT did show her stimulator in her right lower back but they do not see any inflammatory changes around it.  There is also no skin findings to accommodate for that generator was easily palpable and nontender.   [MB]  2232 Possible she is got a small stone and it is not finding it as it was a contrast CT.  She did have some hematuria.   [MB]  2249 Patient is frustrated that we have not found anything and she states she  knows her body and there is something wrong.  I told her with a limitations are tests that we have not found anything so far and that we may need to admit her if her pain is uncontrolled.  She states that she is not going to be willing to stay in the hospital but would rather know why she is feeling like this.   [MB]  2348 She got some Toradol with mild to moderate relief.  I have offered her admission which she is declining.  She does  not want to go home with any medication to mask her symptoms either.  She states she will give it some time and watch and see which directions: Will be back if any worsening.   [MB]    Clinical Course User Index [MB] Hayden Rasmussen, MD     Final Clinical Impressions(s) / ED Diagnoses   Final diagnoses:  Generalized abdominal pain    ED Discharge Orders    None       Hayden Rasmussen, MD 01/16/18 1042

## 2018-01-15 NOTE — Discharge Instructions (Addendum)
Your evaluated in the emergency department for back and abdominal pain.  You had blood work and a CAT scan that did not show an obvious cause of your symptoms.  You were offered admission but he felt you would do better going home.  Please follow-up with your doctor and return if any worsening symptoms.

## 2018-01-15 NOTE — ED Triage Notes (Signed)
Pt C/O right sided flank pain, denies urinary symptoms. Pt also C/O nausea. Denies fevers.

## 2018-01-17 ENCOUNTER — Other Ambulatory Visit: Payer: Self-pay | Admitting: Physician Assistant

## 2018-01-17 DIAGNOSIS — Z1231 Encounter for screening mammogram for malignant neoplasm of breast: Secondary | ICD-10-CM

## 2018-01-24 ENCOUNTER — Ambulatory Visit (HOSPITAL_COMMUNITY)
Admission: RE | Admit: 2018-01-24 | Discharge: 2018-01-24 | Disposition: A | Discharge status: Home / Self Care | Payer: BLUE CROSS/BLUE SHIELD | Source: Ambulatory Visit | Attending: Physician Assistant | Admitting: Physician Assistant

## 2018-01-24 DIAGNOSIS — Z1231 Encounter for screening mammogram for malignant neoplasm of breast: Secondary | ICD-10-CM | POA: Insufficient documentation

## 2018-02-02 ENCOUNTER — Other Ambulatory Visit: Payer: Self-pay | Admitting: Physician Assistant

## 2018-03-08 ENCOUNTER — Other Ambulatory Visit: Payer: Self-pay | Admitting: Physician Assistant

## 2018-03-23 ENCOUNTER — Other Ambulatory Visit: Payer: Self-pay | Admitting: Family Medicine

## 2018-03-23 DIAGNOSIS — R1013 Epigastric pain: Secondary | ICD-10-CM

## 2018-04-14 ENCOUNTER — Other Ambulatory Visit: Payer: Self-pay

## 2018-04-14 ENCOUNTER — Ambulatory Visit (HOSPITAL_COMMUNITY)
Admission: RE | Admit: 2018-04-14 | Discharge: 2018-04-14 | Disposition: A | Discharge status: Home / Self Care | Payer: BLUE CROSS/BLUE SHIELD | Source: Ambulatory Visit | Attending: Family Medicine | Admitting: Family Medicine

## 2018-04-14 ENCOUNTER — Encounter: Payer: Self-pay | Admitting: Family Medicine

## 2018-04-14 ENCOUNTER — Ambulatory Visit: Payer: BLUE CROSS/BLUE SHIELD | Admitting: Family Medicine

## 2018-04-14 VITALS — BP 118/60 | HR 78 | Temp 98.1°F | Resp 16 | Ht 62.0 in | Wt 151.0 lb

## 2018-04-14 DIAGNOSIS — M25531 Pain in right wrist: Secondary | ICD-10-CM | POA: Diagnosis not present

## 2018-04-14 MED ORDER — NAPROXEN 500 MG PO TABS
500.0000 mg | ORAL_TABLET | Freq: Two times a day (BID) | ORAL | 0 refills | Status: DC
Start: 2018-04-14 — End: 2018-06-21

## 2018-04-14 NOTE — Progress Notes (Signed)
Patient ID: Jessica Shaw, female    DOB: 10/16/1965, 52 y.o.   MRN: [id number]  PCP: Orlena Sheldon, PA-C  Chief Complaint  Patient presents with  . R Wrist Pain    x1 month- pain in wrist that radiates up to elbow, decreased ROM    Subjective:   Jessica Shaw is a 52 y.o. female, presents to clinic with CC of 1 month of right wrist pain and swelling that is associate with decreased range of motion.  And is currently mild but it has been very severe at different times over the last month.  She has been wearing a wrist brace and icing it.  swelling is located around her wrist to the ulnar aspect, activities that are most bothersome is grabbing things or twisting and turning things open such as handles or doorknobs.  He denies any injury or strain she is had no bruising or redness associated with it.  She denies any numbness or tingling in her fingers denies any pallor or cyanosis in her fingers. She is ambidextrous writes with her left hand but does most other activities with her right hand.  She notes a history of multiple other areas of joint pain that come and go, states she has addressed this with her PCP, Karis Juba however she is unfortunately no longer with this clinic.  Have not had an opportunity to review any of those notes but patient states that she has had pain to bilateral shoulders, right elbow, bilateral hips and bilateral knees.    Patient Active Problem List   Diagnosis Date Noted  . S/P patent foramen ovale closure 02/28/2015  . Chronic shoulder pain 07/24/2014  . HLD (hyperlipidemia) 08/16/2013  . Hot flashes, menopausal 08/14/2013  . Migraine   . Smoker 08/07/2013  . 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 GERD (gastroesophageal reflux disease) 09/22/2012     Prior to Admission medications   Medication Sig Start Date End Date Taking? Authorizing Provider  atorvastatin (LIPITOR) 80 MG tablet TAKE 1 TABLET BY MOUTH ONCE DAILY 11/04/17  Yes Dena Billet B, PA-C    eletriptan (RELPAX) 40 MG tablet TAKE 1 TABLET BY MOUTH ONCE DAILY AS NEEDED FOR  MIGRAINE ** REPEAT IN 2 HOURS IF NEEDED** 03/09/18  Yes Dena Billet B, PA-C  Ibuprofen (ADVIL MIGRAINE) 200 MG CAPS Take 400-600 mg by mouth daily as needed (migraines).    Yes [provider]  omeprazole (PRILOSEC) 20 MG capsule TAKE 1 CAPSULE BY MOUTH ONCE DAILY 03/23/18  Yes Orlena Sheldon, PA-C     Allergies  Allergen Reactions  . Aspirin Other (See Comments)    G.I. Upset   . Demerol [Meperidine]     Gi upset,   . Morphine And Related Other (See Comments)    G.I. Upset  . Latex Itching and Rash     Family History  Problem Relation Age of Onset  . Diabetes Father   . Diabetes Brother   . Diabetes Brother   . Diabetes Brother   . Diabetes Brother   . Diabetes Brother   . Cancer Mother        unsure primary, deceased at age 29  . Diabetes Unknown   . Colon cancer Neg Hx      Social History   Socioeconomic History  . Marital status: Married    Spouse name: Not on file  . Number of children: Not on file  . Years of education: Not on file  .  Highest education level: Not on file  Occupational History  . Occupation: Heritage manager: [employer]  Social Needs  . Financial resource strain: Not on file  . Food insecurity:    Worry: Not on file    Inability: Not on file  . Transportation needs:    Medical: Not on file    Non-medical: Not on file  Tobacco Use  . Smoking status: Current Every Day Smoker    Packs/day: 1.00    Years: 30.00    Pack years: 30.00    Types: Cigarettes  . Smokeless tobacco: Never Used  Substance and Sexual Activity  . Alcohol use: No  . Drug use: No  . Sexual activity: Yes  Lifestyle  . Physical activity:    Days per week: Not on file    Minutes per session: Not on file  . Stress: Not on file  Relationships  . Social connections:    Talks on phone: Not on file    Gets together: Not on file    Attends religious service: Not on  file    Active member of club or organization: Not on file    Attends meetings of clubs or organizations: Not on file    Relationship status: Not on file  . Intimate partner violence:    Fear of current or ex partner: Not on file    Emotionally abused: Not on file    Physically abused: Not on file    Forced sexual activity: Not on file  Other Topics Concern  . Not on file  Social History Narrative         She works as a Pharmacist, hospital at [employer].   Does no exercise.   Has smoked about 1/2 ppd since age 65 years old-- now this has been for 30 years.(as of 2015)     Review of Systems  Constitutional: Negative.   HENT: Negative.   Eyes: Negative.   Respiratory: Negative.   Cardiovascular: Negative.   Gastrointestinal: Negative.   Endocrine: Negative.   Genitourinary: Negative.   Musculoskeletal: Negative.   Skin: Negative.   Allergic/Immunologic: Negative.   Neurological: Negative.   Hematological: Negative.   Psychiatric/Behavioral: Negative.   All other systems reviewed and are negative.      Objective:    Vitals:   04/14/18 0919  BP: 118/60  Pulse: 78  Resp: 16  Temp: 98.1 F (36.7 C)  TempSrc: Oral  SpO2: 98%  Weight: 151 lb (68.5 kg)  Height: 5\' 2"  (1.575 m)      Physical Exam  Constitutional: She appears well-developed.  HENT:  Head: Normocephalic and atraumatic.  Nose: Nose normal.  Eyes: Conjunctivae are normal. Right eye exhibits no discharge. Left eye exhibits no discharge.  Neck: No tracheal deviation present.  Cardiovascular: Normal rate and regular rhythm.  Pulmonary/Chest: Effort normal. No stridor. No respiratory distress.  Musculoskeletal:       Right wrist: She exhibits decreased range of motion, tenderness, bony tenderness and swelling. She exhibits no effusion, no crepitus, no deformity and no laceration.  Diffuse right wrist tenderness to carpal area and distal ulna to ulnar aspect with associated  edema roughly a 4 x 2 similar area, no erythema  Neurological: She is alert. She exhibits normal muscle tone. Coordination normal.  Skin: Skin is warm and dry. No rash noted.  Psychiatric: She has a normal mood and affect. Her behavior is normal.  Nursing note and vitals reviewed.  Assessment & Plan:      ICD-10-CM   1. Right wrist pain M25.531 DG Wrist Complete Right    Right wrist pain and swelling over ulnar aspect from distal ulna to the carpal area, lateral ulnar side, pain for 1 month, waxing and waning, currently about 50% improved from most severe pain that she is had, she cannot supinate.  Pain currently 7 out of 10, using a soft neoprene wrist brace, she is ambidextrous writes with her left hand but does everything else with her right hand this is limited her activities for the past month.  She has talked to her PCP for over the past year about various intermittent polyarthralgias she reports that she has had a lot of problems with bilateral shoulders, right elbow now right wrist, in the past bilateral hips and bilateral knees, she is never had any work-up for this.  Will start to treat the wrist with NSAIDs, immobilization, ice, obtain x-ray to evaluate joint spaces, she is due to return in about 2 weeks will discuss any rheumatological work-up at that time or see if she does not improve and needs to go to a hand and wrist specialist   Delsa Grana, PA-C 04/14/18 9:35 AM

## 2018-04-19 ENCOUNTER — Telehealth: Payer: Self-pay

## 2018-04-19 DIAGNOSIS — M25531 Pain in right wrist: Secondary | ICD-10-CM

## 2018-04-19 NOTE — Telephone Encounter (Signed)
Patient was seen in office on 04/14/2018 for her wrist. Symptoms have not improved and patient would like to be referred to a specialist

## 2018-04-20 NOTE — Telephone Encounter (Signed)
Can you please put in a referral to ortho/hand specialist for dx in last visit.  I will co-sign It will not allow me to order with remote access, thank you

## 2018-04-21 ENCOUNTER — Other Ambulatory Visit: Payer: Self-pay | Admitting: Physician Assistant

## 2018-04-21 NOTE — Telephone Encounter (Signed)
Referral orders placed

## 2018-04-21 NOTE — Addendum Note (Signed)
Addended by: Sheral Flow on: 04/21/2018 08:31 AM   Modules accepted: Orders

## 2018-04-22 ENCOUNTER — Other Ambulatory Visit: Payer: Self-pay | Admitting: Physician Assistant

## 2018-04-22 DIAGNOSIS — E785 Hyperlipidemia, unspecified: Secondary | ICD-10-CM

## 2018-04-25 ENCOUNTER — Ambulatory Visit: Payer: BLUE CROSS/BLUE SHIELD | Admitting: Physician Assistant

## 2018-04-29 ENCOUNTER — Ambulatory Visit: Payer: BLUE CROSS/BLUE SHIELD | Admitting: Family Medicine

## 2018-04-29 ENCOUNTER — Other Ambulatory Visit: Payer: Self-pay

## 2018-04-29 ENCOUNTER — Encounter: Payer: Self-pay | Admitting: Family Medicine

## 2018-04-29 VITALS — BP 124/66 | HR 76 | Temp 98.0°F | Resp 14 | Ht 62.0 in | Wt 155.0 lb

## 2018-04-29 DIAGNOSIS — F17200 Nicotine dependence, unspecified, uncomplicated: Secondary | ICD-10-CM

## 2018-04-29 DIAGNOSIS — Z23 Encounter for immunization: Secondary | ICD-10-CM

## 2018-04-29 DIAGNOSIS — R739 Hyperglycemia, unspecified: Secondary | ICD-10-CM | POA: Diagnosis not present

## 2018-04-29 DIAGNOSIS — E785 Hyperlipidemia, unspecified: Secondary | ICD-10-CM | POA: Diagnosis not present

## 2018-04-29 DIAGNOSIS — K219 Gastro-esophageal reflux disease without esophagitis: Secondary | ICD-10-CM

## 2018-04-29 DIAGNOSIS — G43009 Migraine without aura, not intractable, without status migrainosus: Secondary | ICD-10-CM

## 2018-04-29 DIAGNOSIS — R5383 Other fatigue: Secondary | ICD-10-CM | POA: Diagnosis not present

## 2018-04-29 LAB — T4, FREE: FREE T4: 1 ng/dL (ref 0.8–1.8)

## 2018-04-29 LAB — TSH: TSH: 0.79 mIU/L

## 2018-04-29 LAB — T3, FREE: T3, Free: 3.2 pg/mL (ref 2.3–4.2)

## 2018-04-29 NOTE — Patient Instructions (Signed)
Any worsening head aches, or headaches that are not typical for you - with any confusion or altered mental status - please come in to be rechecked - we may need to have neurology check you again.  We would also need to do a full evaluation on your neurological and cognitive function.  I will check thyroid function with your other labs and we will notify you with results  Follow up in 6 months with your PCP

## 2018-04-29 NOTE — Progress Notes (Signed)
Patient ID: Jessica Shaw, female    DOB: June 17, 1965, 52 y.o.   MRN: [id number]  PCP: Delsa Grana, PA-C   Chief Complaint  Patient presents with  . Follow-up    is fasting    Subjective:   Jessica Shaw is a 52 y.o. female, presents to clinic with CC of follow up on chronic conditions and for lab work, is fasting.   Hx of HLD tolerating statin, no SE, no myalgias, notes good compliance.   Current smoker, 30+ pack year hx, currently 1/2 ppd. Does not want to quit right now GERD - well controlled on omeprazole, takes NSAIDs as needed for arthritis and joint pain, no worsening to GERD no abd pain She complains of fatigue and sleepiness, states she is "falling sleep everywhere"    Patient Active Problem List   Diagnosis Date Noted  . S/P patent foramen ovale closure 02/28/2015  . Chronic shoulder pain 07/24/2014  . HLD (hyperlipidemia) 08/16/2013  . Hot flashes, menopausal 08/14/2013  . Migraine   . Smoker 08/07/2013  . 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 GERD (gastroesophageal reflux disease) 09/22/2012    Current Meds  Medication Sig  . atorvastatin (LIPITOR) 80 MG tablet TAKE 1 TABLET BY MOUTH ONCE DAILY  . eletriptan (RELPAX) 40 MG tablet TAKE 1 TABLET BY MOUTH ONCE DAILY AS NEEDED FOR  MIGRAINE **REPEAT IN 2 HOURS IF NEEDED**  . Ibuprofen (ADVIL MIGRAINE) 200 MG CAPS Take 400-600 mg by mouth daily as needed (migraines).   . naproxen (NAPROSYN) 500 MG tablet Take 1 tablet (500 mg total) by mouth 2 (two) times daily with a meal.  . omeprazole (PRILOSEC) 20 MG capsule TAKE 1 CAPSULE BY MOUTH ONCE DAILY     Review of Systems  Constitutional: Positive for activity change and fatigue. Negative for appetite change, chills, diaphoresis, fever and unexpected weight change.  HENT: Negative.   Eyes: Negative.   Respiratory: Positive for cough. Negative for choking, chest tightness, shortness of breath, wheezing and stridor.   Cardiovascular: Negative.  Negative for chest  pain, palpitations and leg swelling.  Gastrointestinal: Negative for abdominal distention, abdominal pain, constipation, diarrhea, nausea and vomiting.  Endocrine: Negative.  Negative for cold intolerance, heat intolerance, polydipsia and polyphagia.  Genitourinary: Negative.   Musculoskeletal: Positive for arthralgias.  Skin: Negative.  Negative for color change, pallor, rash and wound.  Allergic/Immunologic: Negative.  Negative for immunocompromised state.  Neurological: Positive for headaches. Negative for dizziness, tremors, seizures, syncope, facial asymmetry, speech difficulty, weakness and light-headedness.  Hematological: Negative.   All other systems reviewed and are negative.      Objective:    Vitals:   04/29/18 0821  BP: 124/66  Pulse: 76  Resp: 14  Temp: 98 F (36.7 C)  TempSrc: Oral  SpO2: 94%  Weight: 155 lb (70.3 kg)  Height: 5\' 2"  (1.575 m)      Physical Exam  Constitutional: She appears well-developed and well-nourished. No distress.  HENT:  Head: Normocephalic and atraumatic.  Nose: Nose normal.  Mouth/Throat: Oropharynx is clear and moist. No oropharyngeal exudate.  Eyes: Pupils are equal, round, and reactive to light. Conjunctivae and EOM are normal. Right eye exhibits no discharge. Left eye exhibits no discharge.  Neck: Normal range of motion. Neck supple. No tracheal deviation present. No thyromegaly present.  Cardiovascular: Normal rate, regular rhythm, normal heart sounds and intact distal pulses. Exam reveals no gallop and no friction rub.  No murmur heard. Pulmonary/Chest: Effort normal and breath sounds normal.  No stridor. No respiratory distress. She has no wheezes. She has no rales.  Abdominal: Soft. Bowel sounds are normal. She exhibits no distension and no mass. There is no tenderness. There is no guarding.  Musculoskeletal: Normal range of motion.  Neurological: She is alert. She exhibits normal muscle tone. Coordination normal.  Skin: Skin  is warm. Capillary refill takes less than 2 seconds. No rash noted. She is not diaphoretic. No erythema. No pallor.  Psychiatric: She has a normal mood and affect. Her behavior is normal.  Nursing note and vitals reviewed.         Assessment & Plan:   Problem List Items Addressed This Visit      Cardiovascular and Mediastinum   Migraine    Continues to have migraines several times a month but takes relpax 1/2 dose and it manages sx.          Digestive   GERD (gastroesophageal reflux disease)    Well controlled on omeprazole, continue meds, diet and lifestyle changes to prevent worsening D/c smoking would help too        Other   Smoker    1/2 ppd She has mild cough, slightly worse than her baseline No CP, no SOB Not interested in quiting      HLD (hyperlipidemia)    Tolerating statin - notes good compliance Check [place]P CMP      Relevant Orders   CBC with Differential/Platelet (Completed)   COMPLETE METABOLIC PANEL WITH GFR (Completed)   Hemoglobin A1c (Completed)   Lipid panel (Completed)    Other Visit Diagnoses    Fatigue, unspecified type    -  Primary   Relevant Orders   TSH (Completed)   T3, Free (Completed)   T4, Free (Completed)   Elevated blood sugar       Relevant Orders   CBC with Differential/Platelet (Completed)   COMPLETE METABOLIC PANEL WITH GFR (Completed)   Hemoglobin A1c (Completed)   Lipid panel (Completed)   Need for immunization against influenza       Relevant Orders   Flu Vaccine QUAD 36+ mos IM (Completed)      Patient compliant with medicines for GERD, hyperlipidemia and uses headache medicine as needed.  Will check renal function, electrolytes, fasting lipid panel and she does have a history of elevated blood sugar in the past, will recheck this with A1c.  She complains of severe fatigue, she has baseline "smoker's cough", no cardiac symptoms her any other focal symptoms causing her sleepiness and fatigue, will screen thyroid  hormones.  CBC to rule out anemia although she does not appear anemic.    Delsa Grana, PA-C 04/29/18 8:53 AM

## 2018-04-30 LAB — LIPID PANEL
CHOLESTEROL: 153 mg/dL (ref <200)
HDL: 38 mg/dL — ABNORMAL LOW (ref >50)
LDL Cholesterol (Calc): 95 mg/dL (calc)
Non-HDL Cholesterol (Calc): 115 mg/dL (calc) (ref <130)
Total CHOL/HDL Ratio: 4 (calc) (ref <5.0)
Triglycerides: 108 mg/dL (ref <150)

## 2018-04-30 LAB — COMPLETE METABOLIC PANEL WITH GFR
AG Ratio: 1.9 (calc) (ref 1.0–2.5)
ALKALINE PHOSPHATASE (APISO): 69 U/L (ref 33–130)
ALT: 17 U/L (ref 6–29)
AST: 15 U/L (ref 10–35)
Albumin: 4.3 g/dL (ref 3.6–5.1)
BUN: 14 mg/dL (ref 7–25)
CO2: 26 mmol/L (ref 20–32)
CREATININE: 0.7 mg/dL (ref 0.50–1.05)
Calcium: 9.8 mg/dL (ref 8.6–10.4)
Chloride: 105 mmol/L (ref 98–110)
GFR, Est African American: 116 mL/min/{1.73_m2} (ref >=60)
GFR, Est Non African American: 100 mL/min/{1.73_m2} (ref >=60)
GLOBULIN: 2.3 g/dL (ref 1.9–3.7)
Glucose, Bld: 92 mg/dL (ref 65–99)
POTASSIUM: 4.1 mmol/L (ref 3.5–5.3)
SODIUM: 140 mmol/L (ref 135–146)
Total Bilirubin: 0.6 mg/dL (ref 0.2–1.2)
Total Protein: 6.6 g/dL (ref 6.1–8.1)

## 2018-04-30 LAB — CBC WITH DIFFERENTIAL/PLATELET
Basophils Absolute: 56 cells/uL (ref 0–200)
Basophils Relative: 0.5 %
EOS PCT: 2.1 %
Eosinophils Absolute: 235 cells/uL (ref 15–500)
HCT: 43.1 % (ref 35.0–45.0)
Hemoglobin: 14.8 g/dL (ref 11.7–15.5)
LYMPHS ABS: 4099 {cells}/uL — AB (ref 850–3900)
MCH: 30.3 pg (ref 27.0–33.0)
MCHC: 34.3 g/dL (ref 32.0–36.0)
MCV: 88.3 fL (ref 80.0–100.0)
MONOS PCT: 4.9 %
MPV: 10.7 fL (ref 7.5–12.5)
NEUTROS PCT: 55.9 %
Neutro Abs: 6261 cells/uL (ref 1500–7800)
PLATELETS: 296 10*3/uL (ref 140–400)
RBC: 4.88 10*6/uL (ref 3.80–5.10)
RDW: 12.8 % (ref 11.0–15.0)
TOTAL LYMPHOCYTE: 36.6 %
WBC mixed population: 549 cells/uL (ref 200–950)
WBC: 11.2 10*3/uL — AB (ref 3.8–10.8)

## 2018-04-30 LAB — HEMOGLOBIN A1C
EAG (MMOL/L): 6.6 (calc)
HEMOGLOBIN A1C: 5.8 %{Hb} — AB (ref <5.7)
MEAN PLASMA GLUCOSE: 120 (calc)

## 2018-05-02 ENCOUNTER — Encounter: Payer: Self-pay | Admitting: Orthopedic Surgery

## 2018-05-02 ENCOUNTER — Ambulatory Visit: Payer: BLUE CROSS/BLUE SHIELD | Admitting: Orthopedic Surgery

## 2018-05-02 VITALS — BP 143/92 | HR 92 | Ht 62.0 in | Wt 155.0 lb

## 2018-05-02 DIAGNOSIS — M24131 Other articular cartilage disorders, right wrist: Secondary | ICD-10-CM | POA: Diagnosis not present

## 2018-05-02 NOTE — Progress Notes (Signed)
NEW PATIENT OFFICE VISIT  Chief Complaint  Patient presents with  . Wrist Pain    Right wrist swelling and pain for 1- 1 1/2 mos. No known injury    52 year old female presents with 60-month history of ulnar-sided wrist pain and swelling with no history of trauma.  She complains of ulnar deviation pain and pain with supination of the wrist unresponsive to naproxen ibuprofen and wrist splinting for the last 3 weeks.   Review of Systems  Constitutional: Negative for chills and fever.  Skin: Negative.   Neurological: Negative for tingling.     Past Medical History:  Diagnosis Date  . Complication of anesthesia   . Embolism - blood clot    arm  . GERD (gastroesophageal reflux disease)   . HLD (hyperlipidemia)   . Migraine   . PONV (postoperative nausea and vomiting)   . Smoker   . Stroke [facility])    TIA    Past Surgical History:  Procedure Laterality Date  . CHOLECYSTECTOMY    . COLONOSCOPY  08/04/2005   [id number] external hemorrhoids and anal papilla, otherwise normal  . COLONOSCOPY N/A 10/07/2012   Procedure: COLONOSCOPY;  Surgeon: Daneil Dolin, MD;  Location: [facility];  Service: Endoscopy;  Laterality: N/A;  10:30  . COLONOSCOPY N/A 10/13/2016   Procedure: COLONOSCOPY;  Surgeon: Daneil Dolin, MD;  Location: [facility];  Service: Endoscopy;  Laterality: N/A;  9:00am  . ESOPHAGOGASTRODUODENOSCOPY  04/20/2003   [id number] changes of reflux esophagitis/Limited gastroesophageal junction, otherwise normal  . ESOPHAGOGASTRODUODENOSCOPY N/A 10/07/2012   Procedure: ESOPHAGOGASTRODUODENOSCOPY (EGD);  Surgeon: Daneil Dolin, MD;  Location: [facility];  Service: Endoscopy;  Laterality: N/A;  . PATENT FORAMEN OVALE CLOSURE    . POLYPECTOMY  10/13/2016   Procedure: POLYPECTOMY;  Surgeon: Daneil Dolin, MD;  Location: [facility];  Service: Endoscopy;;  colon  . SHOULDER ARTHROSCOPY     left  . SPINAL CORD STIMULATOR IMPLANT    . TUBAL LIGATION      Family History   Problem Relation Age of Onset  . Diabetes Father   . Diabetes Brother   . Hypertension Brother   . Diabetes Brother   . Hypertension Brother   . Diabetes Brother   . Diabetes Brother   . Diabetes Brother   . Cancer Mother        unsure primary, deceased at age 75  . Diabetes Unknown   . Colon cancer Neg Hx    Social History   Tobacco Use  . Smoking status: Current Every Day Smoker    Packs/day: 1.00    Years: 30.00    Pack years: 30.00    Types: Cigarettes  . Smokeless tobacco: Never Used  Substance Use Topics  . Alcohol use: No  . Drug use: No    Allergies  Allergen Reactions  . Aspirin Other (See Comments)    G.I. Upset   . Demerol [Meperidine]     Gi upset,   . Morphine And Related Other (See Comments)    G.I. Upset  . Latex Itching and Rash    Current Meds  Medication Sig  . atorvastatin (LIPITOR) 80 MG tablet TAKE 1 TABLET BY MOUTH ONCE DAILY  . eletriptan (RELPAX) 40 MG tablet TAKE 1 TABLET BY MOUTH ONCE DAILY AS NEEDED FOR  MIGRAINE **REPEAT IN 2 HOURS IF NEEDED**  . naproxen (NAPROSYN) 500 MG tablet Take 1 tablet (500 mg total) by mouth 2 (two) times daily with a  meal.  . omeprazole (PRILOSEC) 20 MG capsule TAKE 1 CAPSULE BY MOUTH ONCE DAILY    BP (!) 143/92   Pulse 92   Ht 5\' 2"  (1.575 m)   Wt 155 lb (70.3 kg)   BMI 28.35 kg/m   Physical Exam  Constitutional: She is oriented to person, place, and time. She appears well-developed and well-nourished.  Neurological: She is alert and oriented to person, place, and time.  Psychiatric: She has a normal mood and affect. Judgment normal.  Vitals reviewed.   Ortho Exam  Left wrist no pain no swelling no tenderness full range of motion skin normal neurovascular exam intact  Right wrist ulnar-sided wrist swelling tenderness to palpation over the TCC painful ulnar deviation and flexion extension rotation of the wrist I did not detect any instability strength was normal in the hand the skin looks good  pulses were good color was normal epitrochlear lymph nodes were negative and sensation was normal in the hand  MEDICAL DECISION SECTION  Xrays were done at [facility], x-rays are in epic  My independent reading of xrays:  I agree with the report as dictated no fracture dislocation or arthritis  Encounter Diagnosis  Name Primary?  . Degenerative tear of triangular fibrocartilage complex (TFCC) of right wrist Yes    PLAN: (Rx., injectx, surgery, frx, mri/ct) She has had splinting anti-inflammatories does not want to have oral steroids recommend referral to hand specialist for injection and/or MRI arthrography for further custom splinting  No orders of the defined types were placed in this encounter.   Arther Abbott, MD  05/02/2018 4:14 PM

## 2018-05-03 NOTE — Assessment & Plan Note (Signed)
1/2 ppd She has mild cough, slightly worse than her baseline No CP, no SOB Not interested in quiting

## 2018-05-03 NOTE — Assessment & Plan Note (Signed)
Continues to have migraines several times a month but takes relpax 1/2 dose and it manages sx. 

## 2018-05-03 NOTE — Assessment & Plan Note (Addendum)
Well controlled on omeprazole, continue meds, diet and lifestyle changes to prevent worsening D/c smoking would help too

## 2018-05-03 NOTE — Assessment & Plan Note (Signed)
Tolerating statin - notes good compliance Check [place]P CMP

## 2018-05-09 ENCOUNTER — Encounter: Payer: Self-pay | Admitting: Family Medicine

## 2018-05-09 DIAGNOSIS — R0689 Other abnormalities of breathing: Secondary | ICD-10-CM

## 2018-05-09 DIAGNOSIS — R05 Cough: Secondary | ICD-10-CM

## 2018-05-09 DIAGNOSIS — R059 Cough, unspecified: Secondary | ICD-10-CM

## 2018-05-11 DIAGNOSIS — M24131 Other articular cartilage disorders, right wrist: Secondary | ICD-10-CM | POA: Diagnosis not present

## 2018-05-11 DIAGNOSIS — R52 Pain, unspecified: Secondary | ICD-10-CM | POA: Diagnosis not present

## 2018-05-19 ENCOUNTER — Other Ambulatory Visit: Payer: Self-pay | Admitting: Family Medicine

## 2018-05-19 MED ORDER — ELETRIPTAN HYDROBROMIDE 40 MG PO TABS: ORAL_TABLET | ORAL | 1 refills | Status: DC

## 2018-05-20 ENCOUNTER — Other Ambulatory Visit: Payer: Self-pay | Admitting: Orthopedic Surgery

## 2018-05-20 DIAGNOSIS — M24131 Other articular cartilage disorders, right wrist: Secondary | ICD-10-CM

## 2018-05-25 ENCOUNTER — Other Ambulatory Visit: Payer: Self-pay

## 2018-06-03 ENCOUNTER — Ambulatory Visit
Admission: RE | Admit: 2018-06-03 | Discharge: 2018-06-03 | Disposition: A | Discharge status: Home / Self Care | Payer: BLUE CROSS/BLUE SHIELD | Source: Ambulatory Visit | Attending: Orthopedic Surgery | Admitting: Orthopedic Surgery

## 2018-06-03 DIAGNOSIS — M24131 Other articular cartilage disorders, right wrist: Secondary | ICD-10-CM

## 2018-06-03 DIAGNOSIS — M25531 Pain in right wrist: Secondary | ICD-10-CM | POA: Diagnosis not present

## 2018-06-03 MED ORDER — IOHEXOL 180 MG/ML  SOLN
1.5000 mL | Freq: Once | INTRAMUSCULAR | Status: AC | PRN
  Administered 2018-06-03: 1.5 mL via INTRA_ARTICULAR

## 2018-06-10 ENCOUNTER — Encounter: Payer: Self-pay | Admitting: Family Medicine

## 2018-06-13 NOTE — Telephone Encounter (Signed)
Please notify the pt that since her last visit was over a month ago, she would need to be seen and evaluated.  Since she's going to Dr. Fredna Dow she may be able to go to him for this too.  CXR does not see everything a shoulder x-ray would, and appropriateness of X-ray imaging also needs to be decided with an Ov/exam.  Thanks

## 2018-06-16 NOTE — Telephone Encounter (Signed)
I don't understand why she feels she needs a CXR?   There are specific history and physical exam findings which make x-ray imaging indicated.  In general, I do not order any imaging prior to OV so that they are "ready" when seen.  That is bad care with possibly tons of unnecessary radiation.   CXR's sometimes are put in to recheck that pneumonia is clearing or for other monitoring of other heart or lung conditions.    Please call and clarify what she's asking for and explain standard of care to her.   Thank you

## 2018-06-20 ENCOUNTER — Other Ambulatory Visit: Payer: Self-pay | Admitting: Orthopedic Surgery

## 2018-06-20 DIAGNOSIS — M25331 Other instability, right wrist: Secondary | ICD-10-CM | POA: Insufficient documentation

## 2018-06-20 DIAGNOSIS — M24131 Other articular cartilage disorders, right wrist: Secondary | ICD-10-CM | POA: Diagnosis not present

## 2018-06-21 ENCOUNTER — Encounter (HOSPITAL_BASED_OUTPATIENT_CLINIC_OR_DEPARTMENT_OTHER): Payer: Self-pay | Admitting: *Deleted

## 2018-06-21 ENCOUNTER — Ambulatory Visit: Payer: BLUE CROSS/BLUE SHIELD | Admitting: Family Medicine

## 2018-06-21 ENCOUNTER — Encounter: Payer: Self-pay | Admitting: Family Medicine

## 2018-06-21 ENCOUNTER — Other Ambulatory Visit: Payer: Self-pay

## 2018-06-21 VITALS — BP 124/90 | HR 87 | Temp 97.8°F | Resp 15 | Ht 62.0 in | Wt 152.5 lb

## 2018-06-21 DIAGNOSIS — R05 Cough: Secondary | ICD-10-CM | POA: Diagnosis not present

## 2018-06-21 DIAGNOSIS — F17200 Nicotine dependence, unspecified, uncomplicated: Secondary | ICD-10-CM

## 2018-06-21 DIAGNOSIS — M25511 Pain in right shoulder: Secondary | ICD-10-CM | POA: Diagnosis not present

## 2018-06-21 DIAGNOSIS — R058 Other specified cough: Secondary | ICD-10-CM

## 2018-06-21 DIAGNOSIS — R1013 Epigastric pain: Secondary | ICD-10-CM

## 2018-06-21 MED ORDER — PREDNISONE 20 MG PO TABS: ORAL_TABLET | ORAL | 0 refills | Status: DC

## 2018-06-21 MED ORDER — OMEPRAZOLE 20 MG PO CPDR: 20.0000 mg | DELAYED_RELEASE_CAPSULE | Freq: Every day | ORAL | 0 refills | Status: DC

## 2018-06-21 NOTE — Progress Notes (Signed)
Patient ID: Jessica Shaw, female    DOB: Aug 06, 1965, 53 y.o.   MRN: [id number]  PCP: Delsa Grana, PA-C  Chief Complaint  Patient presents with  . Cough    Patient in today with c/o productive cough, and occasional voice hoarsness. Onset 3 weeks.  . Shoulder Pain    Patient also has c/o right shoulder pain.    Subjective:   Jessica Shaw is a 53 y.o. female, presents to clinic with CC of right shoulder pain onset roughly a month and a half ago, she points to her mid upper lateral right a arm indicating that is where she feels the pain the most severe, she also states that it becomes more painful with any lifting of her arm above the shoulder level.  She did ask her hand specialist who is doing surgery on her right hand, he thought that there could be some stress or strain on the right shoulder because of compensating for right wrist pain and weakness but he did not evaluate her in she does need to go to a different specialist for her shoulder.  She states is been fairly constant for a month and a half, worse with use and with flexing her arm and up or internally rotating her arm.  She has had no fall strain or injury that she can recall.  She did have some surgeries on her left shoulder in the past, she states that she is having some aches up and down her right side which she has had before.  She also endorses some pain that feels like it is behind her scapula and is brought on by yawning or sneezing. She also complains of a productive cough that has began over the last couple weeks, she did have some abnormal breath sounds several months ago when I did see her for routine office visit and for right wrist pain.  At that time she was going to go get outpatient x-ray but she states that when she went to any pain radiology they could not see an x-ray order.  He has tried to call her office to request repeated x-rays but a did not understand why she was asking for another order and I did not  get the message that the order in the computer is not visible by any pain radiology department therefore she has still not gotten this x-ray completed.  She states that she has been smoking about half pack a day since she was 53 years old.  She has never had a chronic or daily cough but over the past several weeks she has productive cough with gray to green to brown sputum.  She does not feel ill.  Denies shortness of breath, wheeze, fever, sweats, weight loss or any other pain in her chest except for intermittently to her left upper back under her scapula and that is only when she sneezes or yawns.  With deep inspiration does not have any pain.  When trying to leave exam room patient also begins to mention that she has been having intermittent tremors up and down her right side associated with joint pain but she has had in the past that [facility] did work-up in 2017.  She cannot describe the tremors they are not exactly muscle spasms they feel like she is about to have a charley horse.  Sometimes she says the tremors come on when she is trying to grab something with her hand.  He also reports a history  of having charley horses in her buttocks.  She cannot tell me any past diagnoses or treatment for this.  I have reviewed office visit from 03/12/2016 with Almyra Free states that she reports has been having a couple of tremors in her hands at times occurs with holding a coffee cup or with writing -she was treated with propranolol to see if it helped with her tremor, and I do not see any other visits which addressed it or which did any work up regarding it.  Patient also states that she is very anxious about getting x-rays done because her mother had vague right shoulder pain for several months it was treated with injections and physical therapy and had 6 months of treatment done prior to getting x-rays and when x-rays were obtained it showed that she had lung cancer so she is very concerned that she may have  lung cancer and that may be the cause of her right shoulder pain.   Patient Active Problem List   Diagnosis Date Noted  . S/P patent foramen ovale closure 02/28/2015  . Chronic shoulder pain 07/24/2014  . HLD (hyperlipidemia) 08/16/2013  . Hot flashes, menopausal 08/14/2013  . Migraine   . Smoker 08/07/2013  . 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 GERD (gastroesophageal reflux disease) 09/22/2012     Prior to Admission medications   Medication Sig Start Date End Date Taking? Authorizing Provider  atorvastatin (LIPITOR) 80 MG tablet TAKE 1 TABLET BY MOUTH ONCE DAILY 04/22/18  Yes Delsa Grana, PA-C  eletriptan (RELPAX) 40 MG tablet TAKE 1 TABLET BY MOUTH ONCE DAILY AS NEEDED FOR  MIGRAINE **REPEAT IN 2 HOURS IF NEEDED** 05/19/18  Yes Delsa Grana, PA-C  Ibuprofen (ADVIL MIGRAINE) 200 MG CAPS Take 400-600 mg by mouth daily as needed (migraines).    Yes [provider]  omeprazole (PRILOSEC) 20 MG capsule Take 1 capsule (20 mg total) by mouth daily. 06/21/18  Yes Delsa Grana, PA-C     Allergies  Allergen Reactions  . Aspirin Other (See Comments)    G.I. Upset   . Demerol [Meperidine]     Gi upset,   . Morphine And Related Other (See Comments)    G.I. Upset  . Latex Itching and Rash     Family History  Problem Relation Age of Onset  . Diabetes Father   . Diabetes Brother   . Hypertension Brother   . Diabetes Brother   . Hypertension Brother   . Diabetes Brother   . Diabetes Brother   . Diabetes Brother   . Cancer Mother        unsure primary, deceased at age 14  . Diabetes Other   . Colon cancer Neg Hx      Social History   Socioeconomic History  . Marital status: Married    Spouse name: Not on file  . Number of children: Not on file  . Years of education: Not on file  . Highest education level: Not on file  Occupational History  . Occupation: Heritage manager: [employer]  Social Needs  . Financial resource strain: Not on file  . Food  insecurity:    Worry: Not on file    Inability: Not on file  . Transportation needs:    Medical: Not on file    Non-medical: Not on file  Tobacco Use  . Smoking status: Current Every Day Smoker    Packs/day: 0.50    Years: 30.00    Pack years: 15.00  Types: Cigarettes  . Smokeless tobacco: Never Used  Substance and Sexual Activity  . Alcohol use: No  . Drug use: No  . Sexual activity: Yes  Lifestyle  . Physical activity:    Days per week: Not on file    Minutes per session: Not on file  . Stress: Not on file  Relationships  . Social connections:    Talks on phone: Not on file    Gets together: Not on file    Attends religious service: Not on file    Active member of club or organization: Not on file    Attends meetings of clubs or organizations: Not on file    Relationship status: Not on file  . Intimate partner violence:    Fear of current or ex partner: Not on file    Emotionally abused: Not on file    Physically abused: Not on file    Forced sexual activity: Not on file  Other Topics Concern  . Not on file  Social History Narrative         She works as a Pharmacist, hospital at [employer].   Does no exercise.   Has smoked about 1/2 ppd since age 32 years old-- now this has been for 30 years.(as of 2015)     Review of Systems  Constitutional: Negative.  Negative for activity change, appetite change, chills, diaphoresis, fatigue and fever.  HENT: Negative.   Eyes: Negative.   Respiratory: Negative.  Negative for apnea, choking, chest tightness, shortness of breath, wheezing and stridor.   Cardiovascular: Negative.  Negative for chest pain.  Gastrointestinal: Negative.   Endocrine: Negative.   Genitourinary: Negative.   Musculoskeletal: Positive for arthralgias and back pain.  Skin: Negative.  Negative for color change and pallor.  Allergic/Immunologic: Negative.  Negative for environmental allergies and immunocompromised state.    Neurological: Positive for tremors. Negative for weakness.  Hematological: Negative.   Psychiatric/Behavioral: Negative.   All other systems reviewed and are negative.      Objective:    Vitals:   06/21/18 1605  BP: 124/90  Pulse: 87  Resp: 15  Temp: 97.8 F (36.6 C)  TempSrc: Oral  SpO2: 99%  Weight: 152 lb 8 oz (69.2 kg)  Height: 5\' 2"  (1.575 m)      Physical Exam Vitals signs and nursing note reviewed.  Constitutional:      General: She is not in acute distress.    Appearance: Normal appearance. She is well-developed and normal weight. She is not ill-appearing, toxic-appearing or diaphoretic.  HENT:     Head: Normocephalic and atraumatic.     Right Ear: Tympanic membrane, ear canal and external ear normal. There is no impacted cerumen.     Left Ear: Tympanic membrane, ear canal and external ear normal. There is no impacted cerumen.     Nose: Mucosal edema (mild edema and erythema of nasal mucosa), congestion and rhinorrhea present.     Right Turbinates: Not enlarged, swollen or pale.     Left Turbinates: Not enlarged, swollen or pale.     Right Sinus: No maxillary sinus tenderness or frontal sinus tenderness.     Left Sinus: No maxillary sinus tenderness or frontal sinus tenderness.     Mouth/Throat:     Lips: No lesions.     Mouth: Mucous membranes are moist.     Pharynx: Posterior oropharyngeal erythema present. No pharyngeal swelling, oropharyngeal exudate or uvula swelling.     Tonsils:  Swelling: 0 on the right. 0 on the left.  Eyes:     General: No scleral icterus.       Right eye: No discharge.        Left eye: No discharge.     Conjunctiva/sclera: Conjunctivae normal.     Pupils: Pupils are equal, round, and reactive to light.  Neck:     Trachea: No tracheal deviation.  Cardiovascular:     Rate and Rhythm: Normal rate and regular rhythm.     Pulses: Normal pulses.     Heart sounds: No murmur. No friction rub. No gallop.   Pulmonary:     Effort:  Pulmonary effort is normal. No tachypnea, accessory muscle usage, prolonged expiration, respiratory distress or retractions.     Breath sounds: Normal breath sounds and air entry. No stridor, decreased air movement or transmitted upper airway sounds. No decreased breath sounds, wheezing, rhonchi or rales.  Chest:     Chest wall: No tenderness.  Abdominal:     General: Abdomen is flat. Bowel sounds are normal.  Musculoskeletal: Normal range of motion.     Right shoulder: She exhibits tenderness. She exhibits normal range of motion, no bony tenderness, normal pulse and normal strength.     Comments: Right shoulder - ttp to biceps groove (mild), lower lateral deltoid muscle.  No bony ttp to [facility] joint, glenohumeral fossa. Pain with flexion above 90 degrees but she can do passive and active range of motion.  Pain with internal rotation Negative open can test, negative drop test + apley's scratch test Some mild ttp to superior aspect of rigth scapula, no ttp to right rhomboid muscles, trapezius, or thoracic paraspinal muscles  Skin:    General: Skin is warm and dry.     Findings: No rash.  Neurological:     Mental Status: She is alert.     Sensory: No sensory deficit.     Motor: No abnormal muscle tone.     Coordination: Coordination normal.     Gait: Gait normal.  Psychiatric:        Behavior: Behavior normal. Behavior is cooperative.           Assessment & Plan:      ICD-10-CM   1. Acute pain of right shoulder M25.511 DG Shoulder Right   suspect rotator cuff tendinopathy, mild, rest ice NSAIDs, xray - if no improvement refer to ortho  2. Productive cough R05 DG Chest 2 View   suspect URI/bronchitis, current smoker, CXR due to past abnormal BS and now productive cough  3. Smoker F17.200     Tx bronchitis with steroids, cough meds, mucinex CXR pending  Right shoulder - possible rotator cuff tendinopathy (subscapularis/supraspinatus most suspicious for mild strain based off PE, she  is tender to lateral deltoid area/upper arm? -Will obtain x-rays, she is having surgery on her right wrist this week so she currently defers physical therapy or any referrals at this time and wants to wait and see how her wrist surgery does.    Patient also briefly mentions some very atypical whole/bizarre tremors or muscle spasms that of come and gone occurred to her right side of her body associated with some kind of tremor that is not present on exam, she says she has muscle aches but also says she has joint aches but is only limited to the right side of her body.  I have discussed with her that I will have to research her chart because it was kind of  a door knob complaint -explained that have to review her chart to see what is been done and have her come back and we could see what else would need to be done for that.  From what I can see she was she was given a panel on the past sentence any lab pertinent to any muscle spasm or tremor complaint.  His labs were less than 2 months ago and fairly unremarkable she had no anemia, no electrolyte derangement.  More than 50% of this 30 min visit was spent face to face with pt addressing multiple acute and remote complaints.   Delsa Grana, PA-C 06/21/18 4:21 PM

## 2018-06-22 ENCOUNTER — Ambulatory Visit (HOSPITAL_COMMUNITY)
Admission: RE | Admit: 2018-06-22 | Discharge: 2018-06-22 | Disposition: A | Discharge status: Home / Self Care | Payer: BLUE CROSS/BLUE SHIELD | Source: Ambulatory Visit | Attending: Family Medicine | Admitting: Family Medicine

## 2018-06-22 ENCOUNTER — Other Ambulatory Visit: Payer: Self-pay | Admitting: Family Medicine

## 2018-06-22 DIAGNOSIS — M25511 Pain in right shoulder: Secondary | ICD-10-CM | POA: Insufficient documentation

## 2018-06-22 DIAGNOSIS — R05 Cough: Secondary | ICD-10-CM | POA: Diagnosis not present

## 2018-06-22 NOTE — Progress Notes (Signed)
Reviewed with Dr Rayburn Mundis Robert, Bernie to proceed

## 2018-06-23 ENCOUNTER — Other Ambulatory Visit: Payer: Self-pay

## 2018-06-23 ENCOUNTER — Encounter (HOSPITAL_BASED_OUTPATIENT_CLINIC_OR_DEPARTMENT_OTHER): Payer: Self-pay | Admitting: Anesthesiology

## 2018-06-23 ENCOUNTER — Ambulatory Visit (HOSPITAL_BASED_OUTPATIENT_CLINIC_OR_DEPARTMENT_OTHER): Payer: BLUE CROSS/BLUE SHIELD | Admitting: Anesthesiology

## 2018-06-23 ENCOUNTER — Encounter (HOSPITAL_BASED_OUTPATIENT_CLINIC_OR_DEPARTMENT_OTHER)
Admission: RE | Disposition: A | Discharge status: Home / Self Care | Payer: Self-pay | Source: Ambulatory Visit | Attending: Orthopedic Surgery

## 2018-06-23 ENCOUNTER — Ambulatory Visit (HOSPITAL_BASED_OUTPATIENT_CLINIC_OR_DEPARTMENT_OTHER)
Admission: RE | Admit: 2018-06-23 | Discharge: 2018-06-23 | Disposition: A | Discharge status: Home / Self Care | Payer: BLUE CROSS/BLUE SHIELD | Source: Ambulatory Visit | Attending: Orthopedic Surgery | Admitting: Orthopedic Surgery

## 2018-06-23 DIAGNOSIS — K219 Gastro-esophageal reflux disease without esophagitis: Secondary | ICD-10-CM | POA: Diagnosis not present

## 2018-06-23 DIAGNOSIS — Z8673 Personal history of transient ischemic attack (TIA), and cerebral infarction without residual deficits: Secondary | ICD-10-CM | POA: Insufficient documentation

## 2018-06-23 DIAGNOSIS — S63511A Sprain of carpal joint of right wrist, initial encounter: Secondary | ICD-10-CM | POA: Insufficient documentation

## 2018-06-23 DIAGNOSIS — F17210 Nicotine dependence, cigarettes, uncomplicated: Secondary | ICD-10-CM | POA: Diagnosis not present

## 2018-06-23 DIAGNOSIS — Z809 Family history of malignant neoplasm, unspecified: Secondary | ICD-10-CM | POA: Insufficient documentation

## 2018-06-23 DIAGNOSIS — Z8249 Family history of ischemic heart disease and other diseases of the circulatory system: Secondary | ICD-10-CM | POA: Diagnosis not present

## 2018-06-23 DIAGNOSIS — X58XXXA Exposure to other specified factors, initial encounter: Secondary | ICD-10-CM | POA: Diagnosis not present

## 2018-06-23 DIAGNOSIS — G4730 Sleep apnea, unspecified: Secondary | ICD-10-CM | POA: Diagnosis not present

## 2018-06-23 DIAGNOSIS — G43909 Migraine, unspecified, not intractable, without status migrainosus: Secondary | ICD-10-CM | POA: Diagnosis not present

## 2018-06-23 DIAGNOSIS — Z9049 Acquired absence of other specified parts of digestive tract: Secondary | ICD-10-CM | POA: Insufficient documentation

## 2018-06-23 DIAGNOSIS — Z86718 Personal history of other venous thrombosis and embolism: Secondary | ICD-10-CM | POA: Diagnosis not present

## 2018-06-23 DIAGNOSIS — Z888 Allergy status to other drugs, medicaments and biological substances status: Secondary | ICD-10-CM | POA: Diagnosis not present

## 2018-06-23 DIAGNOSIS — Z86010 Personal history of colonic polyps: Secondary | ICD-10-CM | POA: Diagnosis not present

## 2018-06-23 DIAGNOSIS — Z91040 Latex allergy status: Secondary | ICD-10-CM | POA: Insufficient documentation

## 2018-06-23 DIAGNOSIS — Z833 Family history of diabetes mellitus: Secondary | ICD-10-CM | POA: Diagnosis not present

## 2018-06-23 DIAGNOSIS — Z885 Allergy status to narcotic agent status: Secondary | ICD-10-CM | POA: Diagnosis not present

## 2018-06-23 DIAGNOSIS — Z886 Allergy status to analgesic agent status: Secondary | ICD-10-CM | POA: Diagnosis not present

## 2018-06-23 DIAGNOSIS — E785 Hyperlipidemia, unspecified: Secondary | ICD-10-CM | POA: Insufficient documentation

## 2018-06-23 DIAGNOSIS — S63591A Other specified sprain of right wrist, initial encounter: Secondary | ICD-10-CM | POA: Diagnosis not present

## 2018-06-23 DIAGNOSIS — I2510 Atherosclerotic heart disease of native coronary artery without angina pectoris: Secondary | ICD-10-CM | POA: Insufficient documentation

## 2018-06-23 DIAGNOSIS — G4733 Obstructive sleep apnea (adult) (pediatric): Secondary | ICD-10-CM | POA: Insufficient documentation

## 2018-06-23 HISTORY — DX: Atherosclerotic heart disease of native coronary artery without angina pectoris: I25.10

## 2018-06-23 HISTORY — PX: WRIST ARTHROSCOPY WITH DEBRIDEMENT: SHX6194

## 2018-06-23 HISTORY — DX: Presence of other specified functional implants: Z96.89

## 2018-06-23 HISTORY — DX: Sleep apnea, unspecified: G47.30

## 2018-06-23 SURGERY — WRIST ARTHROSCOPY WITH DEBRIDEMENT
Anesthesia: Regional | Site: Wrist | Laterality: Right

## 2018-06-23 MED ORDER — ONDANSETRON HCL 4 MG/2ML IJ SOLN
INTRAMUSCULAR | Status: DC | PRN
  Administered 2018-06-23 (×2): 4 mg via INTRAVENOUS

## 2018-06-23 MED ORDER — CEFAZOLIN SODIUM-DEXTROSE 2-4 GM/100ML-% IV SOLN
INTRAVENOUS | Status: AC
  Filled 2018-06-23: qty 100

## 2018-06-23 MED ORDER — MIDAZOLAM HCL 2 MG/2ML IJ SOLN
INTRAMUSCULAR | Status: AC
  Filled 2018-06-23: qty 2

## 2018-06-23 MED ORDER — FENTANYL CITRATE (PF) 100 MCG/2ML IJ SOLN
INTRAMUSCULAR | Status: AC
  Filled 2018-06-23: qty 2

## 2018-06-23 MED ORDER — LIDOCAINE 2% (20 MG/ML) 5 ML SYRINGE
INTRAMUSCULAR | Status: AC
  Filled 2018-06-23: qty 5

## 2018-06-23 MED ORDER — CEFAZOLIN SODIUM-DEXTROSE 2-4 GM/100ML-% IV SOLN
2.0000 g | INTRAVENOUS | Status: AC
  Administered 2018-06-23 (×2): 2 g via INTRAVENOUS

## 2018-06-23 MED ORDER — MIDAZOLAM HCL 2 MG/2ML IJ SOLN
1.0000 mg | INTRAMUSCULAR | Status: DC | PRN
  Administered 2018-06-23 (×2): 2 mg via INTRAVENOUS

## 2018-06-23 MED ORDER — SUCCINYLCHOLINE CHLORIDE 200 MG/10ML IV SOSY
PREFILLED_SYRINGE | INTRAVENOUS | Status: AC
  Filled 2018-06-23: qty 10

## 2018-06-23 MED ORDER — PROPOFOL 500 MG/50ML IV EMUL
INTRAVENOUS | Status: DC | PRN
  Administered 2018-06-23: 100 ug/kg/min via INTRAVENOUS

## 2018-06-23 MED ORDER — ONDANSETRON HCL 4 MG/2ML IJ SOLN
INTRAMUSCULAR | Status: AC
  Filled 2018-06-23: qty 2

## 2018-06-23 MED ORDER — DEXAMETHASONE SODIUM PHOSPHATE 10 MG/ML IJ SOLN
INTRAMUSCULAR | Status: AC
  Filled 2018-06-23: qty 1

## 2018-06-23 MED ORDER — SCOPOLAMINE 1 MG/3DAYS TD PT72
1.0000 | MEDICATED_PATCH | Freq: Once | TRANSDERMAL | Status: DC | PRN
  Administered 2018-06-23: 1.5 mg via TRANSDERMAL

## 2018-06-23 MED ORDER — LIDOCAINE HCL (CARDIAC) PF 100 MG/5ML IV SOSY
PREFILLED_SYRINGE | INTRAVENOUS | Status: DC | PRN
  Administered 2018-06-23: 40 mg via INTRAVENOUS

## 2018-06-23 MED ORDER — LACTATED RINGERS IV SOLN
INTRAVENOUS | Status: DC
  Administered 2018-06-23: 08:00:00 via INTRAVENOUS

## 2018-06-23 MED ORDER — HEPARIN SODIUM (PORCINE) 1000 UNIT/ML IJ SOLN
INTRAMUSCULAR | Status: AC
  Filled 2018-06-23: qty 1

## 2018-06-23 MED ORDER — CHLORHEXIDINE GLUCONATE 4 % EX LIQD: 60.0000 mL | Freq: Once | CUTANEOUS | Status: DC

## 2018-06-23 MED ORDER — SCOPOLAMINE 1 MG/3DAYS TD PT72
MEDICATED_PATCH | TRANSDERMAL | Status: AC
  Filled 2018-06-23: qty 1

## 2018-06-23 MED ORDER — BUPIVACAINE HCL (PF) 0.25 % IJ SOLN
INTRAMUSCULAR | Status: AC
  Filled 2018-06-23: qty 90

## 2018-06-23 MED ORDER — LIDOCAINE HCL (PF) 1 % IJ SOLN
INTRAMUSCULAR | Status: AC
  Filled 2018-06-23: qty 30

## 2018-06-23 MED ORDER — TRAMADOL HCL 50 MG PO TABS: 50.0000 mg | ORAL_TABLET | Freq: Four times a day (QID) | ORAL | 0 refills | Status: DC | PRN

## 2018-06-23 MED ORDER — PHENYLEPHRINE 40 MCG/ML (10ML) SYRINGE FOR IV PUSH (FOR BLOOD PRESSURE SUPPORT)
PREFILLED_SYRINGE | INTRAVENOUS | Status: AC
  Filled 2018-06-23: qty 10

## 2018-06-23 MED ORDER — PROPOFOL 10 MG/ML IV BOLUS
INTRAVENOUS | Status: DC | PRN
  Administered 2018-06-23: 20 mg via INTRAVENOUS

## 2018-06-23 MED ORDER — SCOPOLAMINE 1 MG/3DAYS TD PT72: 1.0000 | MEDICATED_PATCH | TRANSDERMAL | Status: DC

## 2018-06-23 MED ORDER — FENTANYL CITRATE (PF) 100 MCG/2ML IJ SOLN
50.0000 ug | INTRAMUSCULAR | Status: DC | PRN
  Administered 2018-06-23: 100 ug via INTRAVENOUS

## 2018-06-23 MED ORDER — FENTANYL CITRATE (PF) 100 MCG/2ML IJ SOLN: 25.0000 ug | INTRAMUSCULAR | Status: DC | PRN

## 2018-06-23 MED ORDER — BUPIVACAINE-EPINEPHRINE (PF) 0.5% -1:200000 IJ SOLN
INTRAMUSCULAR | Status: DC | PRN
  Administered 2018-06-23: 30 mL via PERINEURAL

## 2018-06-23 MED ORDER — EPHEDRINE 5 MG/ML INJ
INTRAVENOUS | Status: AC
  Filled 2018-06-23: qty 10

## 2018-06-23 MED ORDER — PROMETHAZINE HCL 25 MG/ML IJ SOLN: 6.2500 mg | INTRAMUSCULAR | Status: DC | PRN

## 2018-06-23 SURGICAL SUPPLY — 76 items
BLADE CUDA 2.0 (BLADE) IMPLANT
BLADE EAR TYMPAN 2.5 60D BEAV (BLADE) IMPLANT
BLADE MINI RND TIP GREEN BEAV (BLADE) IMPLANT
BLADE SURG 15 STRL LF DISP TIS (BLADE) ×2 IMPLANT
BLADE SURG 15 STRL SS (BLADE) ×3
BNDG CMPR 9X4 STRL LF SNTH (GAUZE/BANDAGES/DRESSINGS) ×2
BNDG COHESIVE 3X5 TAN STRL LF (GAUZE/BANDAGES/DRESSINGS) ×3 IMPLANT
BNDG ESMARK 4X9 LF (GAUZE/BANDAGES/DRESSINGS) ×1 IMPLANT
BNDG GAUZE ELAST 4 BULKY (GAUZE/BANDAGES/DRESSINGS) ×3 IMPLANT
BUR CUDA 2.9 (BURR) IMPLANT
BUR FULL RADIUS 2.0 (BURR) ×1 IMPLANT
BUR FULL RADIUS 2.9 (BURR) IMPLANT
BUR GATOR 2.9 (BURR) IMPLANT
BUR SPHERICAL 2.9 (BURR) IMPLANT
CANISTER SUCT 1200ML W/VALVE (MISCELLANEOUS) IMPLANT
CHLORAPREP W/TINT 26ML (MISCELLANEOUS) ×3 IMPLANT
CORD BIPOLAR FORCEPS 12FT (ELECTRODE) IMPLANT
COVER BACK TABLE 60X90IN (DRAPES) ×3 IMPLANT
COVER MAYO STAND STRL (DRAPES) ×3 IMPLANT
COVER WAND RF STERILE (DRAPES) IMPLANT
CUFF TOURNIQUET SINGLE 18IN (TOURNIQUET CUFF) ×1 IMPLANT
DRAPE EXTREMITY T 121X128X90 (DISPOSABLE) ×3 IMPLANT
DRAPE IMP U-DRAPE 54X76 (DRAPES) ×3 IMPLANT
DRAPE OEC MINIVIEW 54X84 (DRAPES) IMPLANT
DRAPE SURG 17X23 STRL (DRAPES) ×3 IMPLANT
ELECT SMALL JOINT 90D BASC (ELECTRODE) IMPLANT
GAUZE SPONGE 4X4 12PLY STRL (GAUZE/BANDAGES/DRESSINGS) ×3 IMPLANT
GAUZE XEROFORM 1X8 LF (GAUZE/BANDAGES/DRESSINGS) ×3 IMPLANT
GLOVE BIOGEL PI IND STRL 7.5 (GLOVE) IMPLANT
GLOVE BIOGEL PI IND STRL 8.5 (GLOVE) ×2 IMPLANT
GLOVE BIOGEL PI INDICATOR 7.5 (GLOVE) ×1
GLOVE BIOGEL PI INDICATOR 8.5 (GLOVE) ×1
GLOVE SURG ORTHO 8.0 STRL STRW (GLOVE) ×2 IMPLANT
GLOVE SURG SS PI 7.0 STRL IVOR (GLOVE) ×1 IMPLANT
GLOVE SURG SS PI 8.0 STRL IVOR (GLOVE) ×1 IMPLANT
GOWN STRL REUS W/ TWL LRG LVL3 (GOWN DISPOSABLE) ×2 IMPLANT
GOWN STRL REUS W/TWL LRG LVL3 (GOWN DISPOSABLE) ×3
GOWN STRL REUS W/TWL XL LVL3 (GOWN DISPOSABLE) ×2 IMPLANT
IV NS IRRIG 3000ML ARTHROMATIC (IV SOLUTION) ×3 IMPLANT
IV SET EXT 30 76VOL 4 MALE LL (IV SETS) ×3 IMPLANT
NDL EPIDURAL TUOHY 20GX3.5 (NEEDLE) IMPLANT
NDL SAFETY ECLIPSE 18X1.5 (NEEDLE) ×6 IMPLANT
NDL SPNL 18GX3.5 QUINCKE PK (NEEDLE) IMPLANT
NEEDLE HYPO 18GX1.5 SHARP (NEEDLE) ×3
NEEDLE HYPO 22GX1.5 SAFETY (NEEDLE) ×3 IMPLANT
NEEDLE SPNL 18GX3.5 QUINCKE PK (NEEDLE) IMPLANT
NEEDLE TUOHY 20GX3.5 (NEEDLE) IMPLANT
NS IRRIG 1000ML POUR BTL (IV SOLUTION) IMPLANT
PACK BASIN DAY SURGERY FS (CUSTOM PROCEDURE TRAY) ×3 IMPLANT
PAD CAST 3X4 CTTN HI CHSV (CAST SUPPLIES) ×2 IMPLANT
PADDING CAST ABS 3INX4YD NS (CAST SUPPLIES) ×1
PADDING CAST ABS 4INX4YD NS (CAST SUPPLIES) ×1
PADDING CAST ABS COTTON 3X4 (CAST SUPPLIES) ×2 IMPLANT
PADDING CAST ABS COTTON 4X4 ST (CAST SUPPLIES) ×2 IMPLANT
PADDING CAST COTTON 3X4 STRL (CAST SUPPLIES) ×3
ROUTER HOODED VORTEX 2.9MM (BLADE) IMPLANT
SET SM JOINT TUBING/CANN (CANNULA) IMPLANT
SLEEVE SCD COMPRESS KNEE MED (MISCELLANEOUS) ×1 IMPLANT
SLING ARM FOAM STRAP MED (SOFTGOODS) ×1 IMPLANT
SPLINT PLASTER CAST XFAST 3X15 (CAST SUPPLIES) IMPLANT
SPLINT PLASTER XTRA FASTSET 3X (CAST SUPPLIES) ×10
STOCKINETTE 4X48 STRL (DRAPES) ×3 IMPLANT
SUCTION FRAZIER HANDLE 10FR (MISCELLANEOUS)
SUCTION TUBE FRAZIER 10FR DISP (MISCELLANEOUS) IMPLANT
SUT MERSILENE 4 0 P 3 (SUTURE) IMPLANT
SUT PDS AB 2-0 CT2 27 (SUTURE) IMPLANT
SUT STEEL 4 0 (SUTURE) IMPLANT
SUT VIC AB 2-0 PS2 27 (SUTURE) IMPLANT
SUT VICRYL 4-0 PS2 18IN ABS (SUTURE) IMPLANT
SYR BULB 3OZ (MISCELLANEOUS) ×3 IMPLANT
SYR CONTROL 10ML LL (SYRINGE) ×3 IMPLANT
TUBE CONNECTING 20X1/4 (TUBING) ×1 IMPLANT
TUBING ARTHRO INFLOW-ONLY STRL (TUBING) ×1 IMPLANT
UNDERPAD 30X30 (UNDERPADS AND DIAPERS) ×3 IMPLANT
WAND SHORT BEVEL W/CORD (SURGICAL WAND) IMPLANT
WATER STERILE IRR 1000ML POUR (IV SOLUTION) ×3 IMPLANT

## 2018-06-23 NOTE — Op Note (Signed)
NAME: Jessica Shaw MEDICAL RECORD NO: [id number] DATE OF BIRTH: 1966-03-26 FACILITY[facility] LOCATION: [facility] PHYSICIAN: Wynonia Sours, MD   OPERATIVE REPORT   DATE OF PROCEDURE: 06/23/18    PREOPERATIVE DIAGNOSIS:   Lunotriquetral tear right wrist   POSTOPERATIVE DIAGNOSIS:   Same   PROCEDURE:   Arthroscopy right wrist with debridement lunotriquetral tear   SURGEON: Daryll Brod, [name].   ASSISTANT: none   ANESTHESIA:  Regional with sedation   INTRAVENOUS FLUIDS:  Per anesthesia flow sheet.   ESTIMATED BLOOD LOSS:  Minimal.   COMPLICATIONS:  None.   SPECIMENS:  none   TOURNIQUET TIME:   * Missing tourniquet times found for documented tourniquets in log: [id number] *   DISPOSITION:  Stable to PACU.   INDICATIONS: Patient is a 53 year old female with a history of ulnar-sided wrist pain.  CT arthrogram reveals a lunotriquetral tear.  This not responded to conservative treatment she is like to undergo arthroscopy with debridement.  Pre-peri-and postoperative course been discussed along with risks and complications.  She is aware that there is no guarantee to the surgery the possibility of infection recurrence injury to arteries nerves tendons complete relief symptoms and dystrophy.  In the preoperative area the patient is seen extremity marked by both patient and surgeon antibiotic given  OPERATIVE COURSE: Patient is brought to the operating room after supraclavicular block was carried out without difficulty in the preoperative area.  She was prepped using ChloraPrep in the supine position with the right arm free.  A three-minute dry time was allowed and timeout taken to confirm patient procedure.  The limb was placed in the Arthrex arc arthroscopy tower.  10 pounds of traction was applied.  The joint was inflated to the 3-4 portal a transverse incision made deepened with a hemostat and a blunt trocar trocar was used to enter the joint.  The volar radial wrist  ligaments were intact there was no injury to the scapholunate ligament.  There was no significant degenerative changes in the radiocarpal joint.  And irrigation catheter was placed in 6 you using 18-gauge needle.  Significant fraying of cartilage ulnarly was noted.  Significant synovitis was present on the dorsal aspect.  A 4-5 portal was then opened after localization with a 22-gauge needle.  Again a transverse incision was made deepened with a hemostat drain blunt trocar used to enter the joint.  A probe was introduced and the lunotriquetral tear was immediately apparent.  A full-radius shaver was then introduced and a debridement of the synovitis was then performed the triangular fibrocartilage complex was easily visualized at this point time and found to be intact with a normal trampoline effect.  A significant tear of the lunotriquetral joint was noted.  A the shaver was then introduced to removing the large flap tear attached to the lunate.  The scope was introduced on the ulnar side and this was able to visualize the tear which was near complete.  A further debridement was then performed after reestablishing the scope in the 3-4 portal and using full-radius shaver.  The midcarpal joint was then entered on the ulnar aspect after localization with a 22-gauge needle.  A transverse incision made deepened with a hemostat blunt trocar used to enter the joint.  Midcarpal joint was inspected the cartilage was present both proximally and distally but significant instability was noted to the lunotriquetral joint.  Scope was not able to be fully introduced into the interval.  The scope and instruments  were removed.  A sterile compressive dressing was applied after closure of the portals with interrupted 4 nylon sutures.  Splint was applied volarly.  The patient tolerated the procedure well was taken to the recovery room for observation in satisfactory condition.  She will be discharged home to return to hand center of  [facility] in 1 week on Tylenol and ibuprofen for pain with Ultram as a backup.   Daryll Brod, MD Electronically signed, 06/23/18

## 2018-06-23 NOTE — Transfer of Care (Signed)
Immediate Anesthesia Transfer of Care Note  Patient: Jessica Shaw  Procedure(s) Performed: RIGHT ARTHROSCOPY WRIST DEBRIDEMENT LUNO TRIQUETRAL TEAR SHRINKAGE (Right Wrist)  Patient Location: PACU  Anesthesia Type:MAC combined with regional for post-op pain  Level of Consciousness: awake, alert  and oriented  Airway & Oxygen Therapy: Patient Spontanous Breathing and Patient connected to nasal cannula oxygen  Post-op Assessment: Report given to RN and Post -op Vital signs reviewed and stable  Post vital signs: Reviewed and stable  Last Vitals:  Vitals Value Taken Time  BP    Temp    Pulse 98 06/23/2018  9:36 AM  Resp 23 06/23/2018  9:36 AM  SpO2 98 % 06/23/2018  9:36 AM  Vitals shown include unvalidated device data.  Last Pain:  Vitals:   06/23/18 0722  TempSrc: Oral  PainSc: 4       Patients Stated Pain Goal: 4 (47/09/62 8366)  Complications: No apparent anesthesia complications

## 2018-06-23 NOTE — Progress Notes (Signed)
Assisted Dr. Singer with right, ultrasound guided, supraclavicular block. Side rails up, monitors on throughout procedure. See vital signs in flow sheet. Tolerated Procedure well. 

## 2018-06-23 NOTE — Brief Op Note (Signed)
06/23/2018  9:42 AM  PATIENT:  Jessica Shaw  53 y.o. female  PRE-OPERATIVE DIAGNOSIS:  LUNOTRIQUETRAL TEAR RIGHT WRIST  POST-OPERATIVE DIAGNOSIS:  LUNOTRIQUETRAL TEAR RIGHT WRIST  PROCEDURE:  Procedure(s) with comments: RIGHT ARTHROSCOPY WRIST DEBRIDEMENT LUNO TRIQUETRAL TEAR SHRINKAGE (Right) - AXILLARY BLAOCK  SURGEON:  Surgeon(s) and Role:    * Daryll Brod, MD - Primary  PHYSICIAN ASSISTANT:   ASSISTANTS: none   ANESTHESIA:   regional and IV sedation  EBL:  2 mL   BLOOD ADMINISTERED:none  DRAINS: none   LOCAL MEDICATIONS USED:  NONE  SPECIMEN:  No Specimen  DISPOSITION OF SPECIMEN:  N/A  COUNTS:  YES  TOURNIQUET:  Not used DICTATION: .Dragon Dictation  PLAN OF CARE: Discharge to home after PACU  PATIENT DISPOSITION:  PACU - hemodynamically stable.

## 2018-06-23 NOTE — Anesthesia Preprocedure Evaluation (Addendum)
Anesthesia Evaluation  Patient identified by MRN, date of birth, ID band Patient awake    Reviewed: Allergy & Precautions, NPO status , Patient's Chart, lab work & pertinent test results  History of Anesthesia Complications (+) PONV and history of anesthetic complications  Airway Mallampati: II  TM Distance: >3 FB Neck ROM: Full    Dental no notable dental hx. (+) Dental Advisory Given   Pulmonary sleep apnea , Current Smoker,    Pulmonary exam normal        Cardiovascular negative cardio ROS Normal cardiovascular exam     Neuro/Psych TIAnegative psych ROS   GI/Hepatic Neg liver ROS, GERD  ,  Endo/Other  negative endocrine ROS  Renal/GU negative Renal ROS  negative genitourinary   Musculoskeletal negative musculoskeletal ROS (+)   Abdominal   Peds negative pediatric ROS (+)  Hematology negative hematology ROS (+)   Anesthesia Other Findings   Reproductive/Obstetrics negative OB ROS                            Anesthesia Physical Anesthesia Plan  ASA: II  Anesthesia Plan: Regional   Post-op Pain Management:    Induction:   PONV Risk Score and Plan: 2 and Ondansetron, Scopolamine patch - Pre-op and Propofol infusion  Airway Management Planned: Natural Airway  Additional Equipment:   Intra-op Plan:   Post-operative Plan:   Informed Consent: I have reviewed the patients History and Physical, chart, labs and discussed the procedure including the risks, benefits and alternatives for the proposed anesthesia with the patient or authorized representative who has indicated his/her understanding and acceptance.   Dental advisory given  Plan Discussed with: CRNA and Anesthesiologist  Anesthesia Plan Comments:        Anesthesia Quick Evaluation

## 2018-06-23 NOTE — Anesthesia Postprocedure Evaluation (Signed)
Anesthesia Post Note  Patient: Jessica Shaw  Procedure(s) Performed: RIGHT ARTHROSCOPY WRIST DEBRIDEMENT LUNO TRIQUETRAL TEAR SHRINKAGE (Right Wrist)     Patient location during evaluation: PACU Anesthesia Type: Regional Level of consciousness: awake and alert Pain management: pain level controlled Vital Signs Assessment: post-procedure vital signs reviewed and stable Respiratory status: spontaneous breathing and respiratory function stable Cardiovascular status: stable Postop Assessment: no apparent nausea or vomiting Anesthetic complications: no    Last Vitals:  Vitals:   06/23/18 1027 06/23/18 1050  BP:  100/73  Pulse: 91 83  Resp:  20  Temp:  36.5 C  SpO2: 98% 96%    Last Pain:  Vitals:   06/23/18 1050  TempSrc:   PainSc: 0-No pain                 Khary Schaben DANIEL

## 2018-06-23 NOTE — Anesthesia Procedure Notes (Signed)
Anesthesia Regional Block: Interscalene brachial plexus block   Pre-Anesthetic Checklist: ,, timeout performed, Correct Patient, Correct Site, Correct Laterality, Correct Procedure, Correct Position, site marked, Risks and benefits discussed,  Surgical consent,  Pre-op evaluation,  At surgeon's request and post-op pain management  Laterality: Right  Prep: chloraprep       Needles:  Injection technique: Single-shot  Needle Type: Echogenic Stimulator Needle     Needle Length: 5cm  Needle Gauge: 22     Additional Needles:   Narrative:  Start time: 06/23/2018 7:49 AM End time: 06/23/2018 7:59 AM Injection made incrementally with aspirations every 5 mL.  Performed by: Personally  Anesthesiologist: Duane Boston, MD  Additional Notes: Functioning IV was confirmed and monitors applied.  A 50mm 22ga echogenic arrow stimulator was used. Sterile prep and drape,hand hygiene and sterile gloves were used.Ultrasound guidance: relevant anatomy identified, needle position confirmed, local anesthetic spread visualized around nerve(s)., vascular puncture avoided.  Image printed for medical record.  Negative aspiration and negative test dose prior to incremental administration of local anesthetic. The patient tolerated the procedure well.

## 2018-06-23 NOTE — Discharge Instructions (Addendum)
Post Anesthesia Home Care Instructions  Activity: Get plenty of rest for the remainder of the day. A responsible individual must stay with you for 24 hours following the procedure.  For the next 24 hours, DO NOT: -Drive a car -Operate machinery -Drink alcoholic beverages -Take any medication unless instructed by your physician -Make any legal decisions or sign important papers.  Meals: Start with liquid foods such as gelatin or soup. Progress to regular foods as tolerated. Avoid greasy, spicy, heavy foods. If nausea and/or vomiting occur, drink only clear liquids until the nausea and/or vomiting subsides. Call your physician if vomiting continues.  Special Instructions/Symptoms: Your throat may feel dry or sore from the anesthesia or the breathing tube placed in your throat during surgery. If this causes discomfort, gargle with warm salt water. The discomfort should disappear within 24 hours.  If you had a scopolamine patch placed behind your ear for the management of post- operative nausea and/or vomiting:  1. The medication in the patch is effective for 72 hours, after which it should be removed.  Wrap patch in a tissue and discard in the trash. Wash hands thoroughly with soap and water. 2. You may remove the patch earlier than 72 hours if you experience unpleasant side effects which may include dry mouth, dizziness or visual disturbances. 3. Avoid touching the patch. Wash your hands with soap and water after contact with the patch.      Regional Anesthesia Blocks  1. Numbness or the inability to move the "blocked" extremity may last from 3-48 hours after placement. The length of time depends on the medication injected and your individual response to the medication. If the numbness is not going away after 48 hours, call your surgeon.  2. The extremity that is blocked will need to be protected until the numbness is gone and the  Strength has returned. Because you cannot feel it, you  will need to take extra care to avoid injury. Because it may be weak, you may have difficulty moving it or using it. You may not know what position it is in without looking at it while the block is in effect.  3. For blocks in the legs and feet, returning to weight bearing and walking needs to be done carefully. You will need to wait until the numbness is entirely gone and the strength has returned. You should be able to move your leg and foot normally before you try and bear weight or walk. You will need someone to be with you when you first try to ensure you do not fall and possibly risk injury.  4. Bruising and tenderness at the needle site are common side effects and will resolve in a few days.  5. Persistent numbness or new problems with movement should be communicated to the surgeon or the [facility] ([phone])/ [facility] ([phone]).    [facility] Center Instructions Hand Surgery  Wound Care: Keep your hand elevated above the level of your heart.  Do not allow it to dangle by your side.  Keep the dressing dry and do not remove it unless your doctor advises you to do so.  He will usually change it at the time of your post-op visit.  Moving your fingers is advised to stimulate circulation but will depend on the site of your surgery.  If you have a splint applied, your doctor will advise you regarding movement.  Activity: Do not drive or operate machinery today.  Rest today and   then you may return to your normal activity and work as indicated by your physician.  Diet:  Drink liquids today or eat a light diet.  You may resume a regular diet tomorrow.    General expectations: Pain for two to three days. Fingers may become slightly swollen.  Call your doctor if any of the following occur: Severe pain not relieved by pain medication. Elevated temperature. Dressing soaked with blood. Inability to move fingers. White or bluish color to fingers.  
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
## 2018-06-24 ENCOUNTER — Encounter (HOSPITAL_BASED_OUTPATIENT_CLINIC_OR_DEPARTMENT_OTHER): Payer: Self-pay | Admitting: Orthopedic Surgery

## 2018-07-01 DIAGNOSIS — M25331 Other instability, right wrist: Secondary | ICD-10-CM | POA: Diagnosis not present

## 2018-07-11 DIAGNOSIS — M5412 Radiculopathy, cervical region: Secondary | ICD-10-CM | POA: Diagnosis not present

## 2018-07-27 ENCOUNTER — Other Ambulatory Visit: Payer: Self-pay | Admitting: Family Medicine

## 2018-07-27 DIAGNOSIS — E785 Hyperlipidemia, unspecified: Secondary | ICD-10-CM

## 2018-09-06 DIAGNOSIS — G43909 Migraine, unspecified, not intractable, without status migrainosus: Secondary | ICD-10-CM | POA: Insufficient documentation

## 2018-09-25 ENCOUNTER — Other Ambulatory Visit: Payer: Self-pay | Admitting: Family Medicine

## 2018-09-25 DIAGNOSIS — R1013 Epigastric pain: Secondary | ICD-10-CM

## 2018-10-29 ENCOUNTER — Other Ambulatory Visit: Payer: Self-pay | Admitting: Family Medicine

## 2018-10-29 DIAGNOSIS — E785 Hyperlipidemia, unspecified: Secondary | ICD-10-CM

## 2018-12-02 ENCOUNTER — Other Ambulatory Visit: Payer: Self-pay | Admitting: Family Medicine

## 2018-12-02 DIAGNOSIS — E785 Hyperlipidemia, unspecified: Secondary | ICD-10-CM

## 2018-12-26 ENCOUNTER — Other Ambulatory Visit: Payer: Self-pay | Admitting: Family Medicine

## 2018-12-26 DIAGNOSIS — R1013 Epigastric pain: Secondary | ICD-10-CM

## 2019-01-04 ENCOUNTER — Other Ambulatory Visit: Payer: Self-pay | Admitting: Family Medicine

## 2019-01-04 DIAGNOSIS — E785 Hyperlipidemia, unspecified: Secondary | ICD-10-CM

## 2019-01-10 ENCOUNTER — Other Ambulatory Visit: Payer: Self-pay

## 2019-01-10 ENCOUNTER — Encounter: Payer: Self-pay | Admitting: Family Medicine

## 2019-01-10 ENCOUNTER — Ambulatory Visit: Payer: BLUE CROSS/BLUE SHIELD | Admitting: Family Medicine

## 2019-01-10 VITALS — BP 120/80 | HR 80 | Temp 97.9°F | Ht 62.0 in | Wt 153.5 lb

## 2019-01-10 DIAGNOSIS — G43009 Migraine without aura, not intractable, without status migrainosus: Secondary | ICD-10-CM

## 2019-01-10 DIAGNOSIS — R05 Cough: Secondary | ICD-10-CM

## 2019-01-10 DIAGNOSIS — R55 Syncope and collapse: Secondary | ICD-10-CM

## 2019-01-10 DIAGNOSIS — R002 Palpitations: Secondary | ICD-10-CM | POA: Diagnosis not present

## 2019-01-10 DIAGNOSIS — E785 Hyperlipidemia, unspecified: Secondary | ICD-10-CM | POA: Diagnosis not present

## 2019-01-10 DIAGNOSIS — D72829 Elevated white blood cell count, unspecified: Secondary | ICD-10-CM

## 2019-01-10 DIAGNOSIS — F17200 Nicotine dependence, unspecified, uncomplicated: Secondary | ICD-10-CM

## 2019-01-10 DIAGNOSIS — Z801 Family history of malignant neoplasm of trachea, bronchus and lung: Secondary | ICD-10-CM

## 2019-01-10 DIAGNOSIS — R053 Chronic cough: Secondary | ICD-10-CM

## 2019-01-10 DIAGNOSIS — R7303 Prediabetes: Secondary | ICD-10-CM

## 2019-01-10 DIAGNOSIS — K219 Gastro-esophageal reflux disease without esophagitis: Secondary | ICD-10-CM

## 2019-01-10 MED ORDER — OMEPRAZOLE 20 MG PO CPDR: 20.0000 mg | DELAYED_RELEASE_CAPSULE | Freq: Every day | ORAL | 3 refills | Status: DC

## 2019-01-10 MED ORDER — ATORVASTATIN CALCIUM 80 MG PO TABS: 80.0000 mg | ORAL_TABLET | Freq: Every day | ORAL | 3 refills | Status: DC

## 2019-01-10 MED ORDER — ELETRIPTAN HYDROBROMIDE 40 MG PO TABS: ORAL_TABLET | ORAL | 1 refills | Status: DC

## 2019-01-10 NOTE — Progress Notes (Signed)
Patient ID: Jessica Shaw, female    DOB: Feb 16, 1966, 53 y.o.   MRN: [id number]  PCP: Delsa Grana, PA-C  Chief Complaint  Patient presents with  . Hyperlipidemia    Subjective:   Jessica Shaw is a 52 y.o. female, presents to clinic with CC of "med refill"  She has HLD, prediabetes, is current smoker, also complains of frequent severe palpitations and rapid heart rate with given hx of "afib" having seen Dr. Einar Gip in the past.     Complains of intermittent frequent palpitations, feels like skipping beats and also very rapid and forceful contractions, occurs at rest, but more frequently occurs with activity like hiking or when riding her motorcycle.  Her watch monitors her HR, and sometimes while at rest HR is >140's. Episodes are associated with severe exhaustion, near syncope and sometimes SOB.  She reports hx of afib and seeing cardiology in the past and was previously treated with BB, metoprolol.  After reviewing chart and prior records she saw cardiology last year, report was given to past PCP, Karis Juba who took over care and pt was initially treated with metoprolol and then Dr. Einar Gip changed it to propranolol.  Since I have seen pt for quick f/up appts or acute office visits, she has reported not taking, and there have been no med refill requests.   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  She has continued to have some chronic cough.  She denis    HLD - still compliant with meds, though by refills looks like pt would be out and is due for refills and labs.  She denies SE.    Patient Active Problem List   Diagnosis Date Noted  . S/P patent foramen ovale closure 02/28/2015  . Chronic shoulder pain 07/24/2014  . HLD (hyperlipidemia) 08/16/2013  .  Hot flashes, menopausal 08/14/2013  . Migraine   . Smoker 08/07/2013  . 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 GERD (gastroesophageal reflux disease) 09/22/2012    Current Meds  Medication Sig  . atorvastatin (LIPITOR) 80 MG tablet TAKE 1 TABLET BY MOUTH ONCE DAILY -- **NEEDS OFFICE VISIT FOR FURTHER REFILLS**  . eletriptan (RELPAX) 40 MG tablet TAKE 1 TABLET BY MOUTH ONCE DAILY AS NEEDED FOR  MIGRAINE **REPEAT IN 2 HOURS IF NEEDED**  . Ibuprofen (ADVIL MIGRAINE) 200 MG CAPS Take 400-600 mg by mouth daily as needed (migraines).   Marland Kitchen omeprazole (PRILOSEC) 20 MG capsule Take 1 capsule by mouth once daily     Review of Systems  Constitutional: Negative.   HENT: Negative.   Eyes: Negative.   Respiratory: Negative.   Cardiovascular: Negative.   Gastrointestinal: Negative.   Endocrine: Negative.   Genitourinary: Negative.   Musculoskeletal: Negative.   Skin: Negative.   Allergic/Immunologic: Negative.   Neurological: Negative.   Hematological: Negative.   Psychiatric/Behavioral: Negative.   All other systems reviewed and are negative.      Objective:    Vitals:   01/10/19 1532  BP: 120/80  Pulse: 80  Temp: 97.9 F (36.6 C)  TempSrc: Oral  SpO2: 98%  Weight: 153 lb 8 oz (69.6 kg)  Height: 5\' 2"  (1.575 m)      Physical Exam Vitals signs and nursing note reviewed.  Constitutional:  General: She is not in acute distress.    Appearance: Normal appearance. She is well-developed. She is not ill-appearing, toxic-appearing or diaphoretic.  HENT:     Head: Normocephalic and atraumatic.     Right Ear: External ear normal.     Left Ear: External ear normal.     Nose: Nose normal.     Mouth/Throat:     Mouth: Mucous membranes are moist.     Pharynx: Oropharynx is clear. Uvula midline.  Eyes:     General: Lids are normal. No scleral icterus.       Right eye: No discharge.        Left eye: No discharge.     Conjunctiva/sclera: Conjunctivae normal.     Pupils: Pupils are  equal, round, and reactive to light.  Neck:     Musculoskeletal: Normal range of motion and neck supple.     Trachea: Phonation normal. No tracheal deviation.  Cardiovascular:     Rate and Rhythm: Normal rate and regular rhythm.  No extrasystoles are present.    Chest Wall: PMI is not displaced. No thrill.     Pulses: Normal pulses.          Radial pulses are 2+ on the right side and 2+ on the left side.       Posterior tibial pulses are 2+ on the right side and 2+ on the left side.     Heart sounds: Normal heart sounds. Heart sounds not distant. No murmur. No friction rub. No gallop.   Pulmonary:     Effort: Pulmonary effort is normal. No tachypnea, accessory muscle usage, prolonged expiration, respiratory distress or retractions.     Breath sounds: Normal breath sounds and air entry. No stridor or decreased air movement. No decreased breath sounds, wheezing, rhonchi or rales.  Chest:     Chest wall: No mass, swelling, tenderness or crepitus.  Abdominal:     General: Bowel sounds are normal. There is no distension.     Palpations: Abdomen is soft.     Tenderness: There is no abdominal tenderness. There is no guarding or rebound.  Musculoskeletal: Normal range of motion.        General: No deformity.     Right lower leg: No edema.     Left lower leg: No edema.  Lymphadenopathy:     Cervical: No cervical adenopathy.     Upper Body:     Right upper body: No supraclavicular, axillary or pectoral adenopathy.     Left upper body: No supraclavicular, axillary or pectoral adenopathy.  Skin:    General: Skin is warm and dry.     Capillary Refill: Capillary refill takes less than 2 seconds.     Coloration: Skin is not jaundiced or pale.     Findings: No rash.  Neurological:     Mental Status: She is alert and oriented to person, place, and time.     Motor: No abnormal muscle tone.     Coordination: Coordination normal.     Gait: Gait normal.  Psychiatric:        Speech: Speech normal.         Behavior: Behavior normal.           Assessment & Plan:   Problem List Items Addressed This Visit      Cardiovascular and Mediastinum   Migraine    Continues to have migraines several times a month but takes relpax 1/2 dose and it manages sx.  Relevant Medications   atorvastatin (LIPITOR) 80 MG tablet   eletriptan (RELPAX) 40 MG tablet     Digestive   GERD (gastroesophageal reflux disease)    Still well controlled with omeprazole - she request refills      Relevant Medications   omeprazole (PRILOSEC) 20 MG capsule     Other   Smoker    36 years hx smoking 1/2-1 ppd - developing chronic cough sx some cardiac sx, discussed smoking cessation, but does not desire to quit right now      Relevant Orders   CBC (INCLUDES DIFF/PLT) WITH PATHOLOGIST REVIEW   Lipid panel   HLD (hyperlipidemia)    Compliant with statin, no SE No particular diet right now Recheck fasting labs - pt will return for fasting labs      Relevant Medications   atorvastatin (LIPITOR) 80 MG tablet   Other Relevant Orders   Lipid panel   COMPLETE METABOLIC PANEL WITH GFR    Other Visit Diagnoses    Palpitations    -  Primary - see also near syncope -  No pain but palpitations and near syncope, frequent, HR at rest tachycardic sometimes, other times with mild activity causes near syncope episodes, not associated with PND, LE edema, CP, SOB, diaphoresis   Relevant Orders   TSH   T4, free   Ambulatory referral to Cardiology   Near syncope       secondary to arrhythmia?  previously on propanolol and cardiology turned care back over to PCP [facility], pt off meds, sx worsening - back to cardiology? Repeat holter monitor?  Workup was done about 1-2 years ago and was pertinent for    Relevant Medications   atorvastatin (LIPITOR) 80 MG tablet   Other Relevant Orders   TSH   T4, free   Ambulatory referral to Cardiology   Chronic cough       suspect secondary to smoking, some palpitations and  near syncope, but denies DOE wheeze, CP, sweats, fever, weight loss   Leukocytosis, unspecified type       mildly elevated WBC and lymphocytes in reviewing chart - CBC with blood smear - possibly from smoking?     Relevant Orders   CBC (INCLUDES DIFF/PLT) WITH PATHOLOGIST REVIEW   Prediabetes       repeat labs, work on diet and exercise/lifestyle changes   Relevant Orders   Lipid panel   Hemoglobin A1c   COMPLETE METABOLIC PANEL WITH GFR       10/20/2017 chart review from [facility] f/up appt "She then had a Holter monitor and echocardiogram and then presented to Dr. Einar Gip for follow-up visit 11/15/17.   Reviewed that Holter monitor had shown occasional PVC, PAC and trigeminy, rare atrial bigeminy and atrial couplets. Echocardiogram was performed on 11/04/2017.  Left Ventricle cavity normal in size.  Normal global wall motion.  Normal diastolic filling pattern.  EF 56%.  Left atrial cavity normal in size.  ASD closure device without residual leak or thrombus.  IVC is dilated with respiratory variation.  May suggest elevated central venous pressure. At that visit with Dr. Einar Gip 11/15/2017 he increased dose of propranolol from 60 mg to 120 mg daily in hopes that that would help with palpitations, migraine headaches, and essential tremors.  If symptoms do not improve with this he simply reassured her that these were PACs these were benign and that weight loss and exercise would be helpful."   Other cardiology records not available to me -  would feel better with holter monitor confirming no change to arrhythmia with only PAC/PVC etc, and r/o A fib or SVT.  HR and cardiopulmonary exam at time of visit completely unremarkable.     Delsa Grana, PA-C 01/10/19 10:35 PM

## 2019-01-10 NOTE — Patient Instructions (Addendum)
Return for fasting labs We will call you about CT for your chest or any follow up testing Call your cardiologist and see when he can see you again, I will put in the referral as well.  I will refill all your meds.

## 2019-01-11 NOTE — Assessment & Plan Note (Signed)
36 years hx smoking 1/2-1 ppd - developing chronic cough sx some cardiac sx, discussed smoking cessation, but does not desire to quit right now

## 2019-01-11 NOTE — Assessment & Plan Note (Signed)
Compliant with statin, no SE No particular diet right now Recheck fasting labs - pt will return for fasting labs

## 2019-01-11 NOTE — Assessment & Plan Note (Signed)
Continues to have migraines several times a month but takes relpax 1/2 dose and it manages sx.

## 2019-01-11 NOTE — Assessment & Plan Note (Signed)
Still well controlled with omeprazole - she request refills

## 2019-01-16 ENCOUNTER — Other Ambulatory Visit (HOSPITAL_COMMUNITY): Payer: Self-pay | Admitting: Family Medicine

## 2019-01-16 DIAGNOSIS — Z1231 Encounter for screening mammogram for malignant neoplasm of breast: Secondary | ICD-10-CM

## 2019-01-24 ENCOUNTER — Other Ambulatory Visit: Payer: Self-pay | Admitting: *Deleted

## 2019-01-24 DIAGNOSIS — E785 Hyperlipidemia, unspecified: Secondary | ICD-10-CM

## 2019-01-24 MED ORDER — ATORVASTATIN CALCIUM 80 MG PO TABS: 80.0000 mg | ORAL_TABLET | Freq: Every day | ORAL | 0 refills | Status: DC

## 2019-01-27 ENCOUNTER — Encounter: Payer: Self-pay | Admitting: Family Medicine

## 2019-01-27 ENCOUNTER — Other Ambulatory Visit: Payer: Self-pay | Admitting: Family Medicine

## 2019-01-27 ENCOUNTER — Encounter (HOSPITAL_COMMUNITY): Payer: Self-pay

## 2019-01-27 ENCOUNTER — Other Ambulatory Visit: Payer: Self-pay

## 2019-01-27 ENCOUNTER — Ambulatory Visit (HOSPITAL_COMMUNITY)
Admission: RE | Admit: 2019-01-27 | Discharge: 2019-01-27 | Disposition: A | Discharge status: Home / Self Care | Payer: BC Managed Care – PPO | Source: Ambulatory Visit | Attending: Family Medicine | Admitting: Family Medicine

## 2019-01-27 ENCOUNTER — Other Ambulatory Visit (HOSPITAL_COMMUNITY)
Admission: RE | Admit: 2019-01-27 | Discharge: 2019-01-27 | Disposition: A | Discharge status: Home / Self Care | Payer: BC Managed Care – PPO | Source: Ambulatory Visit | Attending: Family Medicine | Admitting: Family Medicine

## 2019-01-27 DIAGNOSIS — I25118 Atherosclerotic heart disease of native coronary artery with other forms of angina pectoris: Secondary | ICD-10-CM | POA: Insufficient documentation

## 2019-01-27 DIAGNOSIS — I700 Atherosclerosis of aorta: Secondary | ICD-10-CM | POA: Insufficient documentation

## 2019-01-27 DIAGNOSIS — F17200 Nicotine dependence, unspecified, uncomplicated: Secondary | ICD-10-CM | POA: Insufficient documentation

## 2019-01-27 DIAGNOSIS — Z1231 Encounter for screening mammogram for malignant neoplasm of breast: Secondary | ICD-10-CM

## 2019-01-27 DIAGNOSIS — D72829 Elevated white blood cell count, unspecified: Secondary | ICD-10-CM

## 2019-01-27 DIAGNOSIS — E785 Hyperlipidemia, unspecified: Secondary | ICD-10-CM

## 2019-01-27 DIAGNOSIS — Z801 Family history of malignant neoplasm of trachea, bronchus and lung: Secondary | ICD-10-CM | POA: Diagnosis not present

## 2019-01-27 DIAGNOSIS — R05 Cough: Secondary | ICD-10-CM | POA: Insufficient documentation

## 2019-01-27 DIAGNOSIS — I2510 Atherosclerotic heart disease of native coronary artery without angina pectoris: Secondary | ICD-10-CM | POA: Insufficient documentation

## 2019-01-27 DIAGNOSIS — R053 Chronic cough: Secondary | ICD-10-CM

## 2019-01-27 DIAGNOSIS — R55 Syncope and collapse: Secondary | ICD-10-CM

## 2019-01-27 DIAGNOSIS — R7303 Prediabetes: Secondary | ICD-10-CM

## 2019-01-27 DIAGNOSIS — R002 Palpitations: Secondary | ICD-10-CM | POA: Diagnosis not present

## 2019-01-27 LAB — COMPREHENSIVE METABOLIC PANEL
ALT: 22 U/L (ref 0–44)
AST: 16 U/L (ref 15–41)
Albumin: 4.3 g/dL (ref 3.5–5.0)
Alkaline Phosphatase: 67 U/L (ref 38–126)
Anion gap: 11 (ref 5–15)
BUN: 26 mg/dL — ABNORMAL HIGH (ref 6–20)
CO2: 22 mmol/L (ref 22–32)
Calcium: 9.5 mg/dL (ref 8.9–10.3)
Chloride: 103 mmol/L (ref 98–111)
Creatinine, Ser: 0.61 mg/dL (ref 0.44–1.00)
GFR calc Af Amer: >60 mL/min (ref >60)
GFR calc non Af Amer: >60 mL/min (ref >60)
Glucose, Bld: 105 mg/dL — ABNORMAL HIGH (ref 70–99)
Potassium: 4.2 mmol/L (ref 3.5–5.1)
Sodium: 136 mmol/L (ref 135–145)
Total Bilirubin: 0.7 mg/dL (ref 0.3–1.2)
Total Protein: 6.9 g/dL (ref 6.5–8.1)

## 2019-01-27 LAB — LIPID PANEL
Cholesterol: 259 mg/dL — ABNORMAL HIGH (ref 0–200)
HDL: 35 mg/dL — ABNORMAL LOW (ref >40)
LDL Cholesterol: 182 mg/dL — ABNORMAL HIGH (ref 0–99)
Total CHOL/HDL Ratio: 7.4 RATIO
Triglycerides: 210 mg/dL — ABNORMAL HIGH (ref <150)
VLDL: 42 mg/dL — ABNORMAL HIGH (ref 0–40)

## 2019-01-27 LAB — HEMOGLOBIN A1C
Hgb A1c MFr Bld: 6 % — ABNORMAL HIGH (ref 4.8–5.6)
Mean Plasma Glucose: 125.5 mg/dL

## 2019-01-27 LAB — T4, FREE: Free T4: 0.91 ng/dL (ref 0.61–1.12)

## 2019-01-27 LAB — TSH: TSH: 0.83 u[IU]/mL (ref 0.350–4.500)

## 2019-01-27 MED ORDER — ATORVASTATIN CALCIUM 80 MG PO TABS: 80.0000 mg | ORAL_TABLET | Freq: Every day | ORAL | 3 refills | Status: DC

## 2019-01-27 MED ORDER — IOHEXOL 300 MG/ML  SOLN
75.0000 mL | Freq: Once | INTRAMUSCULAR | Status: AC | PRN
  Administered 2019-01-27: 75 mL via INTRAVENOUS

## 2019-02-02 ENCOUNTER — Encounter: Payer: Self-pay | Admitting: Cardiology

## 2019-02-03 ENCOUNTER — Ambulatory Visit (INDEPENDENT_AMBULATORY_CARE_PROVIDER_SITE_OTHER): Payer: BC Managed Care – PPO | Admitting: Cardiology

## 2019-02-03 ENCOUNTER — Encounter: Payer: Self-pay | Admitting: Cardiology

## 2019-02-03 ENCOUNTER — Other Ambulatory Visit: Payer: Self-pay

## 2019-02-03 VITALS — Ht 62.0 in | Wt 152.0 lb

## 2019-02-03 DIAGNOSIS — I2510 Atherosclerotic heart disease of native coronary artery without angina pectoris: Secondary | ICD-10-CM | POA: Diagnosis not present

## 2019-02-03 DIAGNOSIS — R002 Palpitations: Secondary | ICD-10-CM

## 2019-02-03 DIAGNOSIS — E782 Mixed hyperlipidemia: Secondary | ICD-10-CM

## 2019-02-03 DIAGNOSIS — I2584 Coronary atherosclerosis due to calcified coronary lesion: Secondary | ICD-10-CM

## 2019-02-03 DIAGNOSIS — R0609 Other forms of dyspnea: Secondary | ICD-10-CM

## 2019-02-03 DIAGNOSIS — R0600 Dyspnea, unspecified: Secondary | ICD-10-CM

## 2019-02-03 NOTE — Progress Notes (Signed)
Follow up visit  Subjective:   Jessica Shaw, female    DOB: 13-Jun-1966, 53 y.o.   MRN: [id number]   Chief Complaint  Patient presents with  . Palpitations  . Loss of Consciousness  . New Patient (Initial Visit)    HPI  52 y.o. Caucasian female with h/o stroke and PFO, s/p successful PFO closure in 2006, h/o PVC's last seen by Dr. Einar Gip in 11/2017.  Patient has recently had episodes of sudden onset palpitations, lasting for few seconds to minutes. [place], where she felt short of breath while climbing up, which was unusual for her. She recently underwent CT chest for cough, which showed aorta, left main and LAD calcification. Recent lipid panel showed LDL 182, which is significantly higher than baseline for her. She denies any chest pain, but complains of "pressure in her neck" at times, unrelated to exertion.    Past Medical History:  Diagnosis Date  . Complication of anesthesia   . Coronary artery disease    PFO closure 2006  . Embolism - blood clot    arm  . GERD (gastroesophageal reflux disease)   . HLD (hyperlipidemia)   . Migraine   . PONV (postoperative nausea and vomiting)   . Sleep apnea    mild OSA, no CPAP  . Smoker   . Spinal cord stimulator status 2006   in lower back, pt patient  . Stroke [place])    TIA, no deficits     Past Surgical History:  Procedure Laterality Date  . CHOLECYSTECTOMY    . COLONOSCOPY  08/04/2005   [id number] external hemorrhoids and anal papilla, otherwise normal  . COLONOSCOPY N/A 10/07/2012   Procedure: COLONOSCOPY;  Surgeon: Daneil Dolin, MD;  [facility];  Service: Endoscopy;  Laterality: N/A;  10:30  . COLONOSCOPY N/A 10/13/2016   Procedure: COLONOSCOPY;  Surgeon: Daneil Dolin, MD;  [facility];  Service: Endoscopy;  Laterality: N/A;  9:00am  . ESOPHAGOGASTRODUODENOSCOPY  04/20/2003   [id number] changes of reflux esophagitis/Limited gastroesophageal junction,  otherwise normal  . ESOPHAGOGASTRODUODENOSCOPY N/A 10/07/2012   Procedure: ESOPHAGOGASTRODUODENOSCOPY (EGD);  Surgeon: Daneil Dolin, MD;  [facility];  Service: Endoscopy;  Laterality: N/A;  . PATENT FORAMEN OVALE CLOSURE    . POLYPECTOMY  10/13/2016   Procedure: POLYPECTOMY;  Surgeon: Daneil Dolin, MD;  [facility];  Service: Endoscopy;;  colon  . SHOULDER ARTHROSCOPY     left  . SPINAL CORD STIMULATOR IMPLANT    . TUBAL LIGATION    . WRIST ARTHROSCOPY WITH DEBRIDEMENT Right 06/23/2018   Procedure: RIGHT ARTHROSCOPY WRIST DEBRIDEMENT LUNO TRIQUETRAL TEAR SHRINKAGE;  Surgeon: Daryll Brod, MD;  Location: [facility];  Service: Orthopedics;  Laterality: Right;  AXILLARY BLAOCK     Social History   Socioeconomic History  . Marital status: Married    Spouse name: Not on file  . Number of children: 2  . Years of education: Not on file  . Highest education level: Not on file  Occupational History  . Occupation: Heritage manager: [employer]  Social Needs  . Financial resource strain: Not on file  . Food insecurity    Worry: Not on file    Inability: Not on file  . Transportation needs    Medical: Not on file    Non-medical: Not on file  Tobacco Use  . Smoking status: Current Every Day Smoker  Packs/day: 0.50    Years: 36.00    Pack years: 18.00    Types: Cigarettes  . Smokeless tobacco: Never Used  Substance and Sexual Activity  . Alcohol use: No  . Drug use: No  . Sexual activity: Yes  Lifestyle  . Physical activity    Days per week: Not on file    Minutes per session: Not on file  . Stress: Not on file  Relationships  . Social Herbalist on phone: Not on file    Gets together: Not on file    Attends religious service: Not on file    Active member of club or organization: Not on file    Attends meetings of clubs or organizations: Not on file    Relationship status: Not on file  . Intimate partner  violence    Fear of current or ex partner: Not on file    Emotionally abused: Not on file    Physically abused: Not on file    Forced sexual activity: Not on file  Other Topics Concern  . Not on file  Social History Narrative         She works as a Pharmacist, hospital at [employer].   Does no exercise.   Has smoked about 1/2 ppd since age 74 years old-- now this has been for 30 years.(as of 2015)     Family History  Problem Relation Age of Onset  . Diabetes Father   . Diabetes Brother   . Hypertension Brother   . Diabetes Brother   . Hypertension Brother   . Diabetes Brother   . Diabetes Brother   . Diabetes Brother   . Cancer Mother        unsure primary, deceased at age 33  . Diabetes Other   . Colon cancer Neg Hx      Current Outpatient Medications on File Prior to Visit  Medication Sig Dispense Refill  . atorvastatin (LIPITOR) 80 MG tablet Take 1 tablet (80 mg total) by mouth daily at 6 PM. 90 tablet 3  . eletriptan (RELPAX) 40 MG tablet TAKE 1 TABLET BY MOUTH ONCE DAILY AS NEEDED FOR  MIGRAINE **REPEAT IN 2 HOURS IF NEEDED** 60 tablet 1  . Ibuprofen (ADVIL MIGRAINE) 200 MG CAPS Take 400-600 mg by mouth daily as needed (migraines).     Marland Kitchen omeprazole (PRILOSEC) 20 MG capsule Take 1 capsule (20 mg total) by mouth daily. 90 capsule 3   No current facility-administered medications on file prior to visit.     Cardiovascular studies:  EKG 02/03/2019: Sinus rhythm 81 bpm. Normal EKG.  Echocardiogram 11/04/2017: Left ventricle cavity is normal in size. Normal global wall motion. Normal diastolic filling pattern. Calculated EF 56%. Left atrial cavity is normal in size. There is a ASD closure device without residual leak or thrombus. IVC is dilated with respiratory variation. May suggest elevated central venous pressure.  TEE 2006: - The left ventricle was normal.  - The septal occluder is well visualized. There are 2 small mobile      thrombi noted on tee left atrial side. The left atrial     appendage function was normal (normal emptying velocity).     There was no left atrial appendage thrombus identified.  - The septal occluder device is located away from the pulmonary     vein. The pulmonary veins were normal.    Recent labs: Results for TAWSHA, VANGORP" (MRN [id number])  as of 02/03/2019 08:36  Ref. Range 01/27/2019 08:30 01/27/2019 08:37  COMPREHENSIVE METABOLIC PANEL Unknown  Rpt (A)  Sodium Latest Ref Range: 135 - 145 mmol/L  136  Potassium Latest Ref Range: 3.5 - 5.1 mmol/L  4.2  Chloride Latest Ref Range: 98 - 111 mmol/L  103  CO2 Latest Ref Range: 22 - 32 mmol/L  22  Glucose Latest Ref Range: 70 - 99 mg/dL  105 (H)  Mean Plasma Glucose Latest Units: mg/dL 125.5   BUN Latest Ref Range: 6 - 20 mg/dL  26 (H)  Creatinine Latest Ref Range: 0.44 - 1.00 mg/dL  0.61  Calcium Latest Ref Range: 8.9 - 10.3 mg/dL  9.5  Anion gap Latest Ref Range: 5 - 15   11  Alkaline Phosphatase Latest Ref Range: 38 - 126 U/L  67  Albumin Latest Ref Range: 3.5 - 5.0 g/dL  4.3  AST Latest Ref Range: 15 - 41 U/L  16  ALT Latest Ref Range: 0 - 44 U/L  22  Total Protein Latest Ref Range: 6.5 - 8.1 g/dL  6.9  Total Bilirubin Latest Ref Range: 0.3 - 1.2 mg/dL  0.7  GFR, Est Non African American Latest Ref Range: >60 mL/min  >60  GFR, Est African American Latest Ref Range: >60 mL/min  >60  Total CHOL/HDL Ratio Latest Units: RATIO 7.4   Cholesterol Latest Ref Range: 0 - 200 mg/dL 259 (H)   HDL Cholesterol Latest Ref Range: >40 mg/dL 35 (L)   LDL (calc) Latest Ref Range: 0 - 99 mg/dL 182 (H)   Triglycerides Latest Ref Range: <150 mg/dL 210 (H)   VLDL Latest Ref Range: 0 - 40 mg/dL 42 (H)    Results for AARTI, SWIDERSKI" (MRN [id number]) as of 02/03/2019 08:36  Ref. Range 04/29/2018 08:27  WBC Latest Ref Range: 3.8 - 10.8 Thousand/uL 11.2 (H)  WBC mixed population Latest Ref Range: 200 - 950 cells/uL 549   RBC Latest Ref Range: 3.80 - 5.10 Million/uL 4.88  Hemoglobin Latest Ref Range: 11.7 - 15.5 g/dL 14.8  HCT Latest Ref Range: 35.0 - 45.0 % 43.1  MCV Latest Ref Range: 80.0 - 100.0 fL 88.3  MCH Latest Ref Range: 27.0 - 33.0 pg 30.3  MCHC Latest Ref Range: 32.0 - 36.0 g/dL 34.3  RDW Latest Ref Range: 11.0 - 15.0 % 12.8  Platelets Latest Ref Range: 140 - 400 Thousand/uL 296  MPV Latest Ref Range: 7.5 - 12.5 fL 10.7   Results for ALIANY, GEARHART" (MRN [id number]) as of 02/03/2019 08:36  Ref. Range 01/27/2019 08:29 01/27/2019 08:30 01/27/2019 08:37  Glucose Latest Ref Range: 70 - 99 mg/dL   105 (H)  Hemoglobin A1C Latest Ref Range: 4.8 - 5.6 %  6.0 (H)   TSH Latest Ref Range: 0.350 - 4.500 uIU/mL  0.830   T4,Free(Direct) Latest Ref Range: 0.61 - 1.12 ng/dL 0.91     Results for SHARLETT, GRAMLEY" (MRN [id number]) as of 02/03/2019 14:28  Ref. Range 01/27/2019 08:30  Total CHOL/HDL Ratio Latest Units: RATIO 7.4  Cholesterol Latest Ref Range: 0 - 200 mg/dL 259 (H)  HDL Cholesterol Latest Ref Range: >40 mg/dL 35 (L)  LDL (calc) Latest Ref Range: 0 - 99 mg/dL 182 (H)  Triglycerides Latest Ref Range: <150 mg/dL 210 (H)  VLDL Latest Ref Range: 0 - 40 mg/dL 42 (H)    Review of Systems  Constitution: Negative for decreased appetite, malaise/fatigue, weight gain and weight loss.  HENT: Negative for  congestion.        "Pressure in her neck sensation"  Eyes: Negative for visual disturbance.  Cardiovascular: Positive for dyspnea on exertion and palpitations. Negative for chest pain, leg swelling and syncope.  Respiratory: Negative for cough.   Endocrine: Negative for cold intolerance.  Hematologic/Lymphatic: Does not bruise/bleed easily.  Skin: Negative for itching and rash.  Musculoskeletal: Negative for myalgias.  Gastrointestinal: Negative for abdominal pain, nausea and vomiting.  Genitourinary: Negative for dysuria.  Neurological: Negative for dizziness and weakness.   Psychiatric/Behavioral: The patient is not nervous/anxious.   All other systems reviewed and are negative.        Vitals:   02/03/19 1410 02/03/19 1411  SpO2: 97% 97%   BP  128/78  125/75  125/75    BP Location  LeftArm  LeftArm  LeftArm   Patient Position  Supine  Sitting  Standing   Cuff Size  Normal  Normal  Normal   Pulse  80  84  87     Body mass index is 27.8 kg/m. Filed Weights   02/03/19 1404  Weight: 152 lb (68.9 kg)     Objective:   Physical Exam  Constitutional: She is oriented to person, place, and time. She appears well-developed and well-nourished. No distress.  HENT:  Head: Normocephalic and atraumatic.  Eyes: Pupils are equal, round, and reactive to light. Conjunctivae are normal.  Neck: No JVD present.  Cardiovascular: Normal rate, regular rhythm and intact distal pulses.  Pulmonary/Chest: Effort normal and breath sounds normal. She has no wheezes. She has no rales.  Abdominal: Soft. Bowel sounds are normal. There is no rebound.  Musculoskeletal:        General: No edema.  Lymphadenopathy:    She has no cervical adenopathy.  Neurological: She is alert and oriented to person, place, and time. No cranial nerve deficit.  Skin: Skin is warm and dry.  Psychiatric: She has a normal mood and affect.  Nursing note and vitals reviewed.         Assessment & Recommendations:   53 y.o. Caucasian female with h/o stroke and PFO, s/p successful PFO closure in 2006, h/o PVC's last seen by Dr. Einar Gip in 11/2017, now with palpitations, exertional dyspnea.  Exertional dyspnea, palpitations: Essentially normal echocardiogram (2019), other than prior septal occluder. No heart failure on exam. However, concern for CAD given left main and LAD calcification noted on CT chest, and hyperlipidemia. Recommend exercise nuclear stress test. Continue lipitor 80 mg gaily. Patient is reluctant to use aspirin due to intolerance. She is also reluctant to use  plavix at this time. Will discuss further after stress test. Will place on event monitor after stress test. Will also repeat lipid panel, as LDL seems an outlier compared to her prior lipid panels.    Nigel Mormon, MD [facility] Pager: [phone] Office: [phone] If no answer Cell [phone]

## 2019-02-04 ENCOUNTER — Encounter: Payer: Self-pay | Admitting: Cardiology

## 2019-02-04 DIAGNOSIS — I2584 Coronary atherosclerosis due to calcified coronary lesion: Secondary | ICD-10-CM | POA: Insufficient documentation

## 2019-02-04 DIAGNOSIS — R0600 Dyspnea, unspecified: Secondary | ICD-10-CM | POA: Insufficient documentation

## 2019-02-04 DIAGNOSIS — I2510 Atherosclerotic heart disease of native coronary artery without angina pectoris: Secondary | ICD-10-CM | POA: Insufficient documentation

## 2019-02-04 DIAGNOSIS — R002 Palpitations: Secondary | ICD-10-CM | POA: Insufficient documentation

## 2019-02-04 DIAGNOSIS — R0609 Other forms of dyspnea: Secondary | ICD-10-CM | POA: Insufficient documentation

## 2019-03-02 DIAGNOSIS — R002 Palpitations: Secondary | ICD-10-CM | POA: Diagnosis not present

## 2019-03-06 ENCOUNTER — Ambulatory Visit: Payer: BC Managed Care – PPO

## 2019-03-06 ENCOUNTER — Ambulatory Visit (INDEPENDENT_AMBULATORY_CARE_PROVIDER_SITE_OTHER): Payer: BC Managed Care – PPO

## 2019-03-06 ENCOUNTER — Other Ambulatory Visit: Payer: Self-pay

## 2019-03-06 DIAGNOSIS — R002 Palpitations: Secondary | ICD-10-CM

## 2019-03-06 DIAGNOSIS — R0609 Other forms of dyspnea: Secondary | ICD-10-CM

## 2019-03-06 DIAGNOSIS — R0600 Dyspnea, unspecified: Secondary | ICD-10-CM

## 2019-03-09 ENCOUNTER — Encounter: Payer: Self-pay | Admitting: Cardiology

## 2019-03-09 ENCOUNTER — Other Ambulatory Visit: Payer: Self-pay

## 2019-03-09 ENCOUNTER — Ambulatory Visit (INDEPENDENT_AMBULATORY_CARE_PROVIDER_SITE_OTHER): Payer: BC Managed Care – PPO | Admitting: Cardiology

## 2019-03-09 VITALS — BP 154/99 | HR 99 | Temp 98.0°F | Ht 62.0 in | Wt 151.8 lb

## 2019-03-09 DIAGNOSIS — I2510 Atherosclerotic heart disease of native coronary artery without angina pectoris: Secondary | ICD-10-CM | POA: Diagnosis not present

## 2019-03-09 DIAGNOSIS — R0609 Other forms of dyspnea: Secondary | ICD-10-CM | POA: Diagnosis not present

## 2019-03-09 DIAGNOSIS — R0600 Dyspnea, unspecified: Secondary | ICD-10-CM

## 2019-03-09 DIAGNOSIS — R9439 Abnormal result of other cardiovascular function study: Secondary | ICD-10-CM | POA: Insufficient documentation

## 2019-03-09 DIAGNOSIS — E782 Mixed hyperlipidemia: Secondary | ICD-10-CM

## 2019-03-09 DIAGNOSIS — I2584 Coronary atherosclerosis due to calcified coronary lesion: Secondary | ICD-10-CM

## 2019-03-09 MED ORDER — NITROGLYCERIN 0.4 MG SL SUBL: 0.4000 mg | SUBLINGUAL_TABLET | SUBLINGUAL | 3 refills | Status: DC | PRN

## 2019-03-09 MED ORDER — METOPROLOL SUCCINATE ER 50 MG PO TB24: 50.0000 mg | ORAL_TABLET | Freq: Every day | ORAL | 3 refills | Status: DC

## 2019-03-09 NOTE — H&P (View-Only) (Signed)
Follow up visit  Subjective:   Jessica Shaw, female    DOB: 02-23-1966, 53 y.o.   MRN: [id number]   Chief Complaint  Patient presents with  . Palpitations    f/u results  . Chest Pain    pt c/o chest pain    HPI  53 year old Caucasian female with history of stroke and PFO, status post PFO closure in 2006, seen for exertional dyspnea.  Exercise nuclear stress test was significantly abnormal for medium-sized moderate defect noted from the base towards the apex antero-septal wall.  This defect is not noted on rest images, consistent with ischemia.  Left ventricle systolic function calculated by QGS was moderately depressed at 37%.  There was septal hypokinesis. Study was deemed high risk.  Patient continues to have episodes of exertional dyspnea, as well chest and neck pressure.   Past Medical History:  Diagnosis Date  . Complication of anesthesia   . Coronary artery disease    PFO closure 2006  . Embolism - blood clot    arm  . GERD (gastroesophageal reflux disease)   . HLD (hyperlipidemia)   . Migraine   . PONV (postoperative nausea and vomiting)   . Sleep apnea    mild OSA, no CPAP  . Smoker   . Spinal cord stimulator status 2006   in lower back, pt patient  . Stroke [facility])    TIA, no deficits     Past Surgical History:  Procedure Laterality Date  . CHOLECYSTECTOMY    . COLONOSCOPY  08/04/2005   [id number] external hemorrhoids and anal papilla, otherwise normal  . COLONOSCOPY N/A 10/07/2012   Procedure: COLONOSCOPY;  Surgeon: Daneil Dolin, MD;  [facility];  Service: Endoscopy;  Laterality: N/A;  10:30  . COLONOSCOPY N/A 10/13/2016   Procedure: COLONOSCOPY;  Surgeon: Daneil Dolin, MD;  [facility];  Service: Endoscopy;  Laterality: N/A;  9:00am  . ESOPHAGOGASTRODUODENOSCOPY  04/20/2003   [id number] changes of reflux esophagitis/Limited gastroesophageal junction, otherwise normal  . ESOPHAGOGASTRODUODENOSCOPY N/A 10/07/2012   Procedure: ESOPHAGOGASTRODUODENOSCOPY (EGD);  Surgeon: Daneil Dolin, MD;  [facility];  Service: Endoscopy;  Laterality: N/A;  . PATENT FORAMEN OVALE CLOSURE    . POLYPECTOMY  10/13/2016   Procedure: POLYPECTOMY;  Surgeon: Daneil Dolin, MD;  [facility];  Service: Endoscopy;;  colon  . SHOULDER ARTHROSCOPY     left  . SPINAL CORD STIMULATOR IMPLANT    . TUBAL LIGATION    . WRIST ARTHROSCOPY WITH DEBRIDEMENT Right 06/23/2018   Procedure: RIGHT ARTHROSCOPY WRIST DEBRIDEMENT LUNO TRIQUETRAL TEAR SHRINKAGE;  Surgeon: Daryll Brod, MD;  Location: [place];  Service: Orthopedics;  Laterality: Right;  AXILLARY BLAOCK     Social History   Socioeconomic History  . Marital status: Married    Spouse name: Not on file  . Number of children: 2  . Years of education: Not on file  . Highest education level: Not on file  Occupational History  . Occupation: Heritage manager: [employer]  Social Needs  . Financial resource strain: Not on file  . Food insecurity    Worry: Not on file    Inability: Not on file  . Transportation needs    Medical: Not on file    Non-medical: Not on file  Tobacco Use  . Smoking status: Current Every Day Smoker    Packs/day: 0.50    Years: 36.00    Pack years: 18.00  Types: Cigarettes  . Smokeless tobacco: Never Used  Substance and Sexual Activity  . Alcohol use: No  . Drug use: No  . Sexual activity: Yes  Lifestyle  . Physical activity    Days per week: Not on file    Minutes per session: Not on file  . Stress: Not on file  Relationships  . Social Herbalist on phone: Not on file    Gets together: Not on file    Attends religious service: Not on file    Active member of club or organization: Not on file    Attends meetings of clubs or organizations: Not on file    Relationship status: Not on file  . Intimate partner violence    Fear of current or ex partner: Not on file    Emotionally  abused: Not on file    Physically abused: Not on file    Forced sexual activity: Not on file  Other Topics Concern  . Not on file  Social History Narrative         She works as a Pharmacist, hospital at [employer].   Does no exercise.   Has smoked about 1/2 ppd since age 51 years old-- now this has been for 30 years.(as of 2015)     Family History  Problem Relation Age of Onset  . Diabetes Father   . Heart attack Father   . Diabetes Brother   . Hypertension Brother   . Diabetes Brother   . Hypertension Brother   . Diabetes Brother   . Diabetes Brother   . Diabetes Brother   . Cancer Mother        unsure primary, deceased at age 55  . Diabetes Other   . Colon cancer Neg Hx      Current Outpatient Medications on File Prior to Visit  Medication Sig Dispense Refill  . atorvastatin (LIPITOR) 80 MG tablet Take 1 tablet (80 mg total) by mouth daily at 6 PM. 90 tablet 3  . eletriptan (RELPAX) 40 MG tablet TAKE 1 TABLET BY MOUTH ONCE DAILY AS NEEDED FOR  MIGRAINE **REPEAT IN 2 HOURS IF NEEDED** 60 tablet 1  . Ibuprofen (ADVIL MIGRAINE) 200 MG CAPS Take 400-600 mg by mouth daily as needed (migraines).     Marland Kitchen omeprazole (PRILOSEC) 20 MG capsule Take 1 capsule (20 mg total) by mouth daily. 90 capsule 3   No current facility-administered medications on file prior to visit.     Cardiovascular studies:  EKG 03/09/2019: Sinus rhythm 95 bpm with rate variation  Nonspecific ST-T changes  Exercise Sestamibi Stress Test 03/06/19  Patient exercised on Bruce protocol. The resting electrocardiogram demonstrated normal sinus rhythm, normal resting conduction. Patient exercised on Bruce protocol for 6:00  minutes and achieved  92 % of Max Predicted HR (Target HR was >85% MPHR) and 7.04 METS. Stress symptoms included fatigue. Normal BP response.  Exercise capacity was normal. Perfusion imaging study demonstrates a medium-sized moderate defect noted from the base towards  the apex antero-septal wall.  This defect is not noted on rest images, consistent with ischemia.  Left ventricle systolic function calculated by QGS was moderately depressed at 37%.  There is septal hypokinesis.  High risk study. No previous exam available for comparison.   EKG 02/03/2019: Sinus rhythm 81 bpm. Normal EKG.  Echocardiogram 11/04/2017: Left ventricle cavity is normal in size. Normal global wall motion. Normal diastolic filling pattern. Calculated EF 56%. Left atrial  cavity is normal in size. There is a ASD closure device without residual leak or thrombus. IVC is dilated with respiratory variation. May suggest elevated central venous pressure.  TEE 2006: - The left ventricle was normal.  - The septal occluder is well visualized. There are 2 small mobile     thrombi noted on tee left atrial side. The left atrial     appendage function was normal (normal emptying velocity).     There was no left atrial appendage thrombus identified.  - The septal occluder device is located away from the pulmonary     vein. The pulmonary veins were normal.    Recent labs: Results for ROZANA, GORA" (MRN [id number]) as of 02/03/2019 08:36  Ref. Range 01/27/2019 08:30 01/27/2019 08:37  COMPREHENSIVE METABOLIC PANEL Unknown  Rpt (A)  Sodium Latest Ref Range: 135 - 145 mmol/L  136  Potassium Latest Ref Range: 3.5 - 5.1 mmol/L  4.2  Chloride Latest Ref Range: 98 - 111 mmol/L  103  CO2 Latest Ref Range: 22 - 32 mmol/L  22  Glucose Latest Ref Range: 70 - 99 mg/dL  105 (H)  Mean Plasma Glucose Latest Units: mg/dL 125.5   BUN Latest Ref Range: 6 - 20 mg/dL  26 (H)  Creatinine Latest Ref Range: 0.44 - 1.00 mg/dL  0.61  Calcium Latest Ref Range: 8.9 - 10.3 mg/dL  9.5  Anion gap Latest Ref Range: 5 - 15   11  Alkaline Phosphatase Latest Ref Range: 38 - 126 U/L  67  Albumin Latest Ref Range: 3.5 - 5.0 g/dL  4.3  AST Latest Ref Range: 15 - 41 U/L  16  ALT Latest Ref  Range: 0 - 44 U/L  22  Total Protein Latest Ref Range: 6.5 - 8.1 g/dL  6.9  Total Bilirubin Latest Ref Range: 0.3 - 1.2 mg/dL  0.7  GFR, Est Non African American Latest Ref Range: >60 mL/min  >60  GFR, Est African American Latest Ref Range: >60 mL/min  >60  Total CHOL/HDL Ratio Latest Units: RATIO 7.4   Cholesterol Latest Ref Range: 0 - 200 mg/dL 259 (H)   HDL Cholesterol Latest Ref Range: >40 mg/dL 35 (L)   LDL (calc) Latest Ref Range: 0 - 99 mg/dL 182 (H)   Triglycerides Latest Ref Range: <150 mg/dL 210 (H)   VLDL Latest Ref Range: 0 - 40 mg/dL 42 (H)    Results for LAURYL, RIEMERSMA" (MRN [id number]) as of 02/03/2019 08:36  Ref. Range 04/29/2018 08:27  WBC Latest Ref Range: 3.8 - 10.8 Thousand/uL 11.2 (H)  WBC mixed population Latest Ref Range: 200 - 950 cells/uL 549  RBC Latest Ref Range: 3.80 - 5.10 Million/uL 4.88  Hemoglobin Latest Ref Range: 11.7 - 15.5 g/dL 14.8  HCT Latest Ref Range: 35.0 - 45.0 % 43.1  MCV Latest Ref Range: 80.0 - 100.0 fL 88.3  MCH Latest Ref Range: 27.0 - 33.0 pg 30.3  MCHC Latest Ref Range: 32.0 - 36.0 g/dL 34.3  RDW Latest Ref Range: 11.0 - 15.0 % 12.8  Platelets Latest Ref Range: 140 - 400 Thousand/uL 296  MPV Latest Ref Range: 7.5 - 12.5 fL 10.7   Results for AABRIELLA, BENDON" (MRN [id number]) as of 02/03/2019 08:36  Ref. Range 01/27/2019 08:29 01/27/2019 08:30 01/27/2019 08:37  Glucose Latest Ref Range: 70 - 99 mg/dL   105 (H)  Hemoglobin A1C Latest Ref Range: 4.8 - 5.6 %  6.0 (H)   TSH Latest Ref  Range: 0.350 - 4.500 uIU/mL  0.830   T4,Free(Direct) Latest Ref Range: 0.61 - 1.12 ng/dL 0.91     Results for BRETTE, PICCINI" (MRN [id number]) as of 02/03/2019 14:28  Ref. Range 01/27/2019 08:30  Total CHOL/HDL Ratio Latest Units: RATIO 7.4  Cholesterol Latest Ref Range: 0 - 200 mg/dL 259 (H)  HDL Cholesterol Latest Ref Range: >40 mg/dL 35 (L)  LDL (calc) Latest Ref Range: 0 - 99 mg/dL 182 (H)  Triglycerides Latest Ref Range: <150  mg/dL 210 (H)  VLDL Latest Ref Range: 0 - 40 mg/dL 42 (H)    Review of Systems  Constitution: Negative for decreased appetite, malaise/fatigue, weight gain and weight loss.  HENT: Negative for congestion.        "Pressure in her neck sensation"  Eyes: Negative for visual disturbance.  Cardiovascular: Positive for dyspnea on exertion and palpitations. Negative for chest pain, leg swelling and syncope.  Respiratory: Negative for cough.   Endocrine: Negative for cold intolerance.  Hematologic/Lymphatic: Does not bruise/bleed easily.  Skin: Negative for itching and rash.  Musculoskeletal: Negative for myalgias.  Gastrointestinal: Negative for abdominal pain, nausea and vomiting.  Genitourinary: Negative for dysuria.  Neurological: Negative for dizziness and weakness.  Psychiatric/Behavioral: The patient is not nervous/anxious.   All other systems reviewed and are negative.        Vitals:   03/09/19 1325  BP: (!) 154/99  Pulse: 99  Temp: 98 F (36.7 C)  SpO2: 98%    Body mass index is 27.76 kg/m. Filed Weights   03/09/19 1325  Weight: 68.9 kg     Objective:   Physical Exam  Constitutional: She is oriented to person, place, and time. She appears well-developed and well-nourished. No distress.  HENT:  Head: Normocephalic and atraumatic.  Eyes: Pupils are equal, round, and reactive to light. Conjunctivae are normal.  Neck: No JVD present.  Cardiovascular: Normal rate, regular rhythm and intact distal pulses.  Pulmonary/Chest: Effort normal and breath sounds normal. She has no wheezes. She has no rales.  Abdominal: Soft. Bowel sounds are normal. There is no rebound.  Musculoskeletal:        General: No edema.  Lymphadenopathy:    She has no cervical adenopathy.  Neurological: She is alert and oriented to person, place, and time. No cranial nerve deficit.  Skin: Skin is warm and dry.  Psychiatric: She has a normal mood and affect.  Nursing note and vitals reviewed.          Assessment & Recommendations:   53 year old Caucasian female with history of stroke and PFO, status post PFO closure in 2006, now with exertional dyspnea, chest/neck pressure, high risk stress test.   Exertional chest pain, dyspnea: High risk stress test concerning for obstructive CAD. Started metoprolol succiante 50 mg daily. Continue Aspirin/ lipitor 80 mg. SL NTG as needed.   We discussed regarding risks, benefits, alternatives to this including stress testing, CTA and continued medical therapy. Patient wants to proceed. Understands <1-2% risk of death, stroke, MI, urgent CABG, bleeding, infection, renal failure but not limited to these.   Nigel Mormon, MD [facility] Pager: [phone] Office: [phone] If no answer Cell [phone]

## 2019-03-09 NOTE — Progress Notes (Signed)
Follow up visit  Subjective:   Jessica Shaw, female    DOB: [birth year], 53 y.o.   MRN: [id number]   Chief Complaint  Patient presents with  . Palpitations    f/u results  . Chest Pain    pt c/o chest pain    HPI  53 year old Caucasian female with history of stroke and PFO, status post PFO closure in 2006, seen for exertional dyspnea.  Exercise nuclear stress test was significantly abnormal for medium-sized moderate defect noted from the base towards the apex antero-septal wall.  This defect is not noted on rest images, consistent with ischemia.  Left ventricle systolic function calculated by QGS was moderately depressed at 37%.  There was septal hypokinesis. Study was deemed high risk.  Patient continues to have episodes of exertional dyspnea, as well chest and neck pressure.   Past Medical History:  Diagnosis Date  . Complication of anesthesia   . Coronary artery disease    PFO closure 2006  . Embolism - blood clot    arm  . GERD (gastroesophageal reflux disease)   . HLD (hyperlipidemia)   . Migraine   . PONV (postoperative nausea and vomiting)   . Sleep apnea    mild OSA, no CPAP  . Smoker   . Spinal cord stimulator status 2006   in lower back, pt patient  . Stroke [facility])    TIA, no deficits     Past Surgical History:  Procedure Laterality Date  . CHOLECYSTECTOMY    . COLONOSCOPY  08/04/2005   [id number] external hemorrhoids and anal papilla, otherwise normal  . COLONOSCOPY N/A 10/07/2012   Procedure: COLONOSCOPY;  Surgeon: Daneil Dolin, MD;  [facility];  Service: Endoscopy;  Laterality: N/A;  10:30  . COLONOSCOPY N/A 10/13/2016   Procedure: COLONOSCOPY;  Surgeon: Daneil Dolin, MD;  [facility];  Service: Endoscopy;  Laterality: N/A;  9:00am  . ESOPHAGOGASTRODUODENOSCOPY  04/20/2003   [id number] changes of reflux esophagitis/Limited gastroesophageal junction, otherwise normal  . ESOPHAGOGASTRODUODENOSCOPY N/A 10/07/2012   Procedure: ESOPHAGOGASTRODUODENOSCOPY (EGD);  Surgeon: Daneil Dolin, MD;  [facility];  Service: Endoscopy;  Laterality: N/A;  . PATENT FORAMEN OVALE CLOSURE    . POLYPECTOMY  10/13/2016   Procedure: POLYPECTOMY;  Surgeon: Daneil Dolin, MD;  [facility];  Service: Endoscopy;;  colon  . SHOULDER ARTHROSCOPY     left  . SPINAL CORD STIMULATOR IMPLANT    . TUBAL LIGATION    . WRIST ARTHROSCOPY WITH DEBRIDEMENT Right 06/23/2018   Procedure: RIGHT ARTHROSCOPY WRIST DEBRIDEMENT LUNO TRIQUETRAL TEAR SHRINKAGE;  Surgeon: Daryll Brod, MD;  Location: [place];  Service: Orthopedics;  Laterality: Right;  AXILLARY BLAOCK     Social History   Socioeconomic History  . Marital status: Married    Spouse name: Not on file  . Number of children: 2  . Years of education: Not on file  . Highest education level: Not on file  Occupational History  . Occupation: Heritage manager: [employer]  Social Needs  . Financial resource strain: Not on file  . Food insecurity    Worry: Not on file    Inability: Not on file  . Transportation needs    Medical: Not on file    Non-medical: Not on file  Tobacco Use  . Smoking status: Current Every Day Smoker    Packs/day: 0.50    Years: 36.00    Pack years: 18.00  Types: Cigarettes  . Smokeless tobacco: Never Used  Substance and Sexual Activity  . Alcohol use: No  . Drug use: No  . Sexual activity: Yes  Lifestyle  . Physical activity    Days per week: Not on file    Minutes per session: Not on file  . Stress: Not on file  Relationships  . Social Herbalist on phone: Not on file    Gets together: Not on file    Attends religious service: Not on file    Active member of club or organization: Not on file    Attends meetings of clubs or organizations: Not on file    Relationship status: Not on file  . Intimate partner violence    Fear of current or ex partner: Not on file    Emotionally  abused: Not on file    Physically abused: Not on file    Forced sexual activity: Not on file  Other Topics Concern  . Not on file  Social History Narrative         She works as a Pharmacist, hospital at [employer].   Does no exercise.   Has smoked about 1/2 ppd since age 5 years old-- now this has been for 30 years.(as of 2015)     Family History  Problem Relation Age of Onset  . Diabetes Father   . Heart attack Father   . Diabetes Brother   . Hypertension Brother   . Diabetes Brother   . Hypertension Brother   . Diabetes Brother   . Diabetes Brother   . Diabetes Brother   . Cancer Mother        unsure primary, deceased at age 57  . Diabetes Other   . Colon cancer Neg Hx      Current Outpatient Medications on File Prior to Visit  Medication Sig Dispense Refill  . atorvastatin (LIPITOR) 80 MG tablet Take 1 tablet (80 mg total) by mouth daily at 6 PM. 90 tablet 3  . eletriptan (RELPAX) 40 MG tablet TAKE 1 TABLET BY MOUTH ONCE DAILY AS NEEDED FOR  MIGRAINE **REPEAT IN 2 HOURS IF NEEDED** 60 tablet 1  . Ibuprofen (ADVIL MIGRAINE) 200 MG CAPS Take 400-600 mg by mouth daily as needed (migraines).     Marland Kitchen omeprazole (PRILOSEC) 20 MG capsule Take 1 capsule (20 mg total) by mouth daily. 90 capsule 3   No current facility-administered medications on file prior to visit.     Cardiovascular studies:  EKG 03/09/2019: Sinus rhythm 95 bpm with rate variation  Nonspecific ST-T changes  Exercise Sestamibi Stress Test 03/06/19  Patient exercised on Bruce protocol. The resting electrocardiogram demonstrated normal sinus rhythm, normal resting conduction. Patient exercised on Bruce protocol for 6:00  minutes and achieved  92 % of Max Predicted HR (Target HR was >85% MPHR) and 7.04 METS. Stress symptoms included fatigue. Normal BP response.  Exercise capacity was normal. Perfusion imaging study demonstrates a medium-sized moderate defect noted from the base towards  the apex antero-septal wall.  This defect is not noted on rest images, consistent with ischemia.  Left ventricle systolic function calculated by QGS was moderately depressed at 37%.  There is septal hypokinesis.  High risk study. No previous exam available for comparison.   EKG 02/03/2019: Sinus rhythm 81 bpm. Normal EKG.  Echocardiogram 11/04/2017: Left ventricle cavity is normal in size. Normal global wall motion. Normal diastolic filling pattern. Calculated EF 56%. Left atrial  cavity is normal in size. There is a ASD closure device without residual leak or thrombus. IVC is dilated with respiratory variation. May suggest elevated central venous pressure.  TEE 2006: - The left ventricle was normal.  - The septal occluder is well visualized. There are 2 small mobile     thrombi noted on tee left atrial side. The left atrial     appendage function was normal (normal emptying velocity).     There was no left atrial appendage thrombus identified.  - The septal occluder device is located away from the pulmonary     vein. The pulmonary veins were normal.    Recent labs: Results for HILAREE, ROTRUCK" (MRN [id number]) as of 02/03/2019 08:36  Ref. Range 01/27/2019 08:30 01/27/2019 08:37  COMPREHENSIVE METABOLIC PANEL Unknown  Rpt (A)  Sodium Latest Ref Range: 135 - 145 mmol/L  136  Potassium Latest Ref Range: 3.5 - 5.1 mmol/L  4.2  Chloride Latest Ref Range: 98 - 111 mmol/L  103  CO2 Latest Ref Range: 22 - 32 mmol/L  22  Glucose Latest Ref Range: 70 - 99 mg/dL  105 (H)  Mean Plasma Glucose Latest Units: mg/dL 125.5   BUN Latest Ref Range: 6 - 20 mg/dL  26 (H)  Creatinine Latest Ref Range: 0.44 - 1.00 mg/dL  0.61  Calcium Latest Ref Range: 8.9 - 10.3 mg/dL  9.5  Anion gap Latest Ref Range: 5 - 15   11  Alkaline Phosphatase Latest Ref Range: 38 - 126 U/L  67  Albumin Latest Ref Range: 3.5 - 5.0 g/dL  4.3  AST Latest Ref Range: 15 - 41 U/L  16  ALT Latest Ref  Range: 0 - 44 U/L  22  Total Protein Latest Ref Range: 6.5 - 8.1 g/dL  6.9  Total Bilirubin Latest Ref Range: 0.3 - 1.2 mg/dL  0.7  GFR, Est Non African American Latest Ref Range: >60 mL/min  >60  GFR, Est African American Latest Ref Range: >60 mL/min  >60  Total CHOL/HDL Ratio Latest Units: RATIO 7.4   Cholesterol Latest Ref Range: 0 - 200 mg/dL 259 (H)   HDL Cholesterol Latest Ref Range: >40 mg/dL 35 (L)   LDL (calc) Latest Ref Range: 0 - 99 mg/dL 182 (H)   Triglycerides Latest Ref Range: <150 mg/dL 210 (H)   VLDL Latest Ref Range: 0 - 40 mg/dL 42 (H)    Results for JOVANI, ENTWISTLE" (MRN [id number]) as of 02/03/2019 08:36  Ref. Range 04/29/2018 08:27  WBC Latest Ref Range: 3.8 - 10.8 Thousand/uL 11.2 (H)  WBC mixed population Latest Ref Range: 200 - 950 cells/uL 549  RBC Latest Ref Range: 3.80 - 5.10 Million/uL 4.88  Hemoglobin Latest Ref Range: 11.7 - 15.5 g/dL 14.8  HCT Latest Ref Range: 35.0 - 45.0 % 43.1  MCV Latest Ref Range: 80.0 - 100.0 fL 88.3  MCH Latest Ref Range: 27.0 - 33.0 pg 30.3  MCHC Latest Ref Range: 32.0 - 36.0 g/dL 34.3  RDW Latest Ref Range: 11.0 - 15.0 % 12.8  Platelets Latest Ref Range: 140 - 400 Thousand/uL 296  MPV Latest Ref Range: 7.5 - 12.5 fL 10.7   Results for BRIDGETTE, NECOCHEA" (MRN [id number]) as of 02/03/2019 08:36  Ref. Range 01/27/2019 08:29 01/27/2019 08:30 01/27/2019 08:37  Glucose Latest Ref Range: 70 - 99 mg/dL   105 (H)  Hemoglobin A1C Latest Ref Range: 4.8 - 5.6 %  6.0 (H)   TSH Latest Ref  Range: 0.350 - 4.500 uIU/mL  0.830   T4,Free(Direct) Latest Ref Range: 0.61 - 1.12 ng/dL 0.91     Results for GARIELLE, BLAKE" (MRN [id number]) as of 02/03/2019 14:28  Ref. Range 01/27/2019 08:30  Total CHOL/HDL Ratio Latest Units: RATIO 7.4  Cholesterol Latest Ref Range: 0 - 200 mg/dL 259 (H)  HDL Cholesterol Latest Ref Range: >40 mg/dL 35 (L)  LDL (calc) Latest Ref Range: 0 - 99 mg/dL 182 (H)  Triglycerides Latest Ref Range: <150  mg/dL 210 (H)  VLDL Latest Ref Range: 0 - 40 mg/dL 42 (H)    Review of Systems  Constitution: Negative for decreased appetite, malaise/fatigue, weight gain and weight loss.  HENT: Negative for congestion.        "Pressure in her neck sensation"  Eyes: Negative for visual disturbance.  Cardiovascular: Positive for dyspnea on exertion and palpitations. Negative for chest pain, leg swelling and syncope.  Respiratory: Negative for cough.   Endocrine: Negative for cold intolerance.  Hematologic/Lymphatic: Does not bruise/bleed easily.  Skin: Negative for itching and rash.  Musculoskeletal: Negative for myalgias.  Gastrointestinal: Negative for abdominal pain, nausea and vomiting.  Genitourinary: Negative for dysuria.  Neurological: Negative for dizziness and weakness.  Psychiatric/Behavioral: The patient is not nervous/anxious.   All other systems reviewed and are negative.        Vitals:   03/09/19 1325  BP: (!) 154/99  Pulse: 99  Temp: 98 F (36.7 C)  SpO2: 98%    Body mass index is 27.76 kg/m. Filed Weights   03/09/19 1325  Weight: 68.9 kg     Objective:   Physical Exam  Constitutional: She is oriented to person, place, and time. She appears well-developed and well-nourished. No distress.  HENT:  Head: Normocephalic and atraumatic.  Eyes: Pupils are equal, round, and reactive to light. Conjunctivae are normal.  Neck: No JVD present.  Cardiovascular: Normal rate, regular rhythm and intact distal pulses.  Pulmonary/Chest: Effort normal and breath sounds normal. She has no wheezes. She has no rales.  Abdominal: Soft. Bowel sounds are normal. There is no rebound.  Musculoskeletal:        General: No edema.  Lymphadenopathy:    She has no cervical adenopathy.  Neurological: She is alert and oriented to person, place, and time. No cranial nerve deficit.  Skin: Skin is warm and dry.  Psychiatric: She has a normal mood and affect.  Nursing note and vitals reviewed.          Assessment & Recommendations:   53 year old Caucasian female with history of stroke and PFO, status post PFO closure in 2006, now with exertional dyspnea, chest/neck pressure, high risk stress test.   Exertional chest pain, dyspnea: High risk stress test concerning for obstructive CAD. Started metoprolol succiante 50 mg daily. Continue Aspirin/ lipitor 80 mg. SL NTG as needed.   We discussed regarding risks, benefits, alternatives to this including stress testing, CTA and continued medical therapy. Patient wants to proceed. Understands <1-2% risk of death, stroke, MI, urgent CABG, bleeding, infection, renal failure but not limited to these.   Nigel Mormon, MD [facility] Pager: [phone] Office: [phone] If no answer Cell [phone]

## 2019-03-10 ENCOUNTER — Other Ambulatory Visit (HOSPITAL_COMMUNITY)
Admission: RE | Admit: 2019-03-10 | Discharge: 2019-03-10 | Disposition: A | Discharge status: Home / Self Care | Payer: BC Managed Care – PPO | Source: Ambulatory Visit | Attending: Cardiology | Admitting: Cardiology

## 2019-03-10 DIAGNOSIS — Z01812 Encounter for preprocedural laboratory examination: Secondary | ICD-10-CM | POA: Insufficient documentation

## 2019-03-10 DIAGNOSIS — Z20828 Contact with and (suspected) exposure to other viral communicable diseases: Secondary | ICD-10-CM | POA: Insufficient documentation

## 2019-03-10 LAB — BASIC METABOLIC PANEL
BUN/Creatinine Ratio: 19 (ref 9–23)
BUN: 15 mg/dL (ref 6–24)
CO2: 25 mmol/L (ref 20–29)
Calcium: 10 mg/dL (ref 8.7–10.2)
Chloride: 105 mmol/L (ref 96–106)
Creatinine, Ser: 0.79 mg/dL (ref 0.57–1.00)
GFR calc Af Amer: 100 mL/min/{1.73_m2} (ref >59)
GFR calc non Af Amer: 86 mL/min/{1.73_m2} (ref >59)
Glucose: 118 mg/dL — ABNORMAL HIGH (ref 65–99)
Potassium: 3.9 mmol/L (ref 3.5–5.2)
Sodium: 142 mmol/L (ref 134–144)

## 2019-03-10 LAB — CBC
Hematocrit: 43.9 % (ref 34.0–46.6)
Hemoglobin: 14.8 g/dL (ref 11.1–15.9)
MCH: 30 pg (ref 26.6–33.0)
MCHC: 33.7 g/dL (ref 31.5–35.7)
MCV: 89 fL (ref 79–97)
Platelets: 290 10*3/uL (ref 150–450)
RBC: 4.94 x10E6/uL (ref 3.77–5.28)
RDW: 12.7 % (ref 11.7–15.4)
WBC: 9.6 10*3/uL (ref 3.4–10.8)

## 2019-03-10 LAB — SARS CORONAVIRUS 2 (TAT 6-24 HRS): SARS Coronavirus 2: NEGATIVE

## 2019-03-11 ENCOUNTER — Encounter: Payer: Self-pay | Admitting: Cardiology

## 2019-03-14 ENCOUNTER — Encounter (HOSPITAL_COMMUNITY): Payer: Self-pay | Admitting: *Deleted

## 2019-03-14 ENCOUNTER — Encounter (HOSPITAL_COMMUNITY)
Admission: RE | Disposition: A | Discharge status: Home / Self Care | Payer: Self-pay | Source: Home / Self Care | Attending: Cardiology

## 2019-03-14 ENCOUNTER — Ambulatory Visit (HOSPITAL_COMMUNITY)
Admission: RE | Admit: 2019-03-14 | Discharge: 2019-03-14 | Disposition: A | Discharge status: Home / Self Care | Payer: BC Managed Care – PPO | Attending: Cardiology | Admitting: Cardiology

## 2019-03-14 ENCOUNTER — Other Ambulatory Visit: Payer: Self-pay

## 2019-03-14 DIAGNOSIS — E785 Hyperlipidemia, unspecified: Secondary | ICD-10-CM | POA: Insufficient documentation

## 2019-03-14 DIAGNOSIS — G4733 Obstructive sleep apnea (adult) (pediatric): Secondary | ICD-10-CM | POA: Diagnosis not present

## 2019-03-14 DIAGNOSIS — Z79899 Other long term (current) drug therapy: Secondary | ICD-10-CM | POA: Diagnosis not present

## 2019-03-14 DIAGNOSIS — R0609 Other forms of dyspnea: Secondary | ICD-10-CM | POA: Diagnosis present

## 2019-03-14 DIAGNOSIS — I25118 Atherosclerotic heart disease of native coronary artery with other forms of angina pectoris: Secondary | ICD-10-CM | POA: Diagnosis present

## 2019-03-14 DIAGNOSIS — K219 Gastro-esophageal reflux disease without esophagitis: Secondary | ICD-10-CM | POA: Diagnosis not present

## 2019-03-14 DIAGNOSIS — I2510 Atherosclerotic heart disease of native coronary artery without angina pectoris: Secondary | ICD-10-CM | POA: Diagnosis present

## 2019-03-14 DIAGNOSIS — R9439 Abnormal result of other cardiovascular function study: Secondary | ICD-10-CM | POA: Diagnosis present

## 2019-03-14 DIAGNOSIS — R079 Chest pain, unspecified: Secondary | ICD-10-CM | POA: Diagnosis present

## 2019-03-14 DIAGNOSIS — Z8673 Personal history of transient ischemic attack (TIA), and cerebral infarction without residual deficits: Secondary | ICD-10-CM | POA: Diagnosis not present

## 2019-03-14 DIAGNOSIS — F17210 Nicotine dependence, cigarettes, uncomplicated: Secondary | ICD-10-CM | POA: Insufficient documentation

## 2019-03-14 DIAGNOSIS — R0600 Dyspnea, unspecified: Secondary | ICD-10-CM | POA: Diagnosis present

## 2019-03-14 DIAGNOSIS — R0789 Other chest pain: Secondary | ICD-10-CM

## 2019-03-14 HISTORY — PX: LEFT HEART CATH AND CORONARY ANGIOGRAPHY: CATH118249

## 2019-03-14 HISTORY — PX: INTRAVASCULAR PRESSURE WIRE/FFR STUDY: CATH118243

## 2019-03-14 LAB — POCT ACTIVATED CLOTTING TIME: Activated Clotting Time: 356 seconds

## 2019-03-14 SURGERY — LEFT HEART CATH AND CORONARY ANGIOGRAPHY
Anesthesia: LOCAL

## 2019-03-14 MED ORDER — SODIUM CHLORIDE 0.9 % WEIGHT BASED INFUSION: 1.0000 mL/kg/h | INTRAVENOUS | Status: DC

## 2019-03-14 MED ORDER — SODIUM CHLORIDE 0.9 % WEIGHT BASED INFUSION
3.0000 mL/kg/h | INTRAVENOUS | Status: AC
  Administered 2019-03-14: 3 mL/kg/h via INTRAVENOUS

## 2019-03-14 MED ORDER — HYDRALAZINE HCL 20 MG/ML IJ SOLN: 10.0000 mg | INTRAMUSCULAR | Status: DC | PRN

## 2019-03-14 MED ORDER — ACETAMINOPHEN 325 MG PO TABS: 650.0000 mg | ORAL_TABLET | ORAL | Status: DC | PRN

## 2019-03-14 MED ORDER — SODIUM CHLORIDE 0.9% FLUSH: 3.0000 mL | Freq: Two times a day (BID) | INTRAVENOUS | Status: DC

## 2019-03-14 MED ORDER — FENTANYL CITRATE (PF) 100 MCG/2ML IJ SOLN
INTRAMUSCULAR | Status: DC | PRN
  Administered 2019-03-14 (×2): 25 ug via INTRAVENOUS

## 2019-03-14 MED ORDER — LIDOCAINE HCL (PF) 1 % IJ SOLN
INTRAMUSCULAR | Status: AC
  Filled 2019-03-14: qty 30

## 2019-03-14 MED ORDER — HEPARIN SODIUM (PORCINE) 1000 UNIT/ML IJ SOLN
INTRAMUSCULAR | Status: DC | PRN
  Administered 2019-03-14 (×2): 3500 [IU] via INTRAVENOUS

## 2019-03-14 MED ORDER — FENTANYL CITRATE (PF) 100 MCG/2ML IJ SOLN
INTRAMUSCULAR | Status: AC
  Filled 2019-03-14: qty 2

## 2019-03-14 MED ORDER — HEPARIN SODIUM (PORCINE) 1000 UNIT/ML IJ SOLN
INTRAMUSCULAR | Status: AC
  Filled 2019-03-14: qty 1

## 2019-03-14 MED ORDER — SODIUM CHLORIDE 0.9% FLUSH: 3.0000 mL | INTRAVENOUS | Status: DC | PRN

## 2019-03-14 MED ORDER — HEPARIN (PORCINE) IN NACL 1000-0.9 UT/500ML-% IV SOLN
INTRAVENOUS | Status: AC
  Filled 2019-03-14: qty 1000

## 2019-03-14 MED ORDER — IOHEXOL 350 MG/ML SOLN
INTRAVENOUS | Status: DC | PRN
  Administered 2019-03-14: 110 mL

## 2019-03-14 MED ORDER — VERAPAMIL HCL 2.5 MG/ML IV SOLN
INTRAVENOUS | Status: DC | PRN
  Administered 2019-03-14: 10 mL via INTRA_ARTERIAL

## 2019-03-14 MED ORDER — HEPARIN (PORCINE) IN NACL 1000-0.9 UT/500ML-% IV SOLN
INTRAVENOUS | Status: DC | PRN
  Administered 2019-03-14 (×3): 500 mL

## 2019-03-14 MED ORDER — SODIUM CHLORIDE 0.9 % IV SOLN: 250.0000 mL | INTRAVENOUS | Status: DC | PRN

## 2019-03-14 MED ORDER — MIDAZOLAM HCL 2 MG/2ML IJ SOLN
INTRAMUSCULAR | Status: AC
  Filled 2019-03-14: qty 2

## 2019-03-14 MED ORDER — HEPARIN (PORCINE) IN NACL 1000-0.9 UT/500ML-% IV SOLN
INTRAVENOUS | Status: AC
  Filled 2019-03-14: qty 500

## 2019-03-14 MED ORDER — SODIUM CHLORIDE 0.9 % IV SOLN: INTRAVENOUS | Status: AC

## 2019-03-14 MED ORDER — VERAPAMIL HCL 2.5 MG/ML IV SOLN
INTRA_ARTERIAL | Status: DC | PRN
  Administered 2019-03-14: 10 mL via INTRA_ARTERIAL

## 2019-03-14 MED ORDER — ONDANSETRON HCL 4 MG/2ML IJ SOLN: 4.0000 mg | Freq: Four times a day (QID) | INTRAMUSCULAR | Status: DC | PRN

## 2019-03-14 MED ORDER — VERAPAMIL HCL 2.5 MG/ML IV SOLN
INTRAVENOUS | Status: AC
  Filled 2019-03-14: qty 2

## 2019-03-14 MED ORDER — LIDOCAINE HCL (PF) 1 % IJ SOLN
INTRAMUSCULAR | Status: DC | PRN
  Administered 2019-03-14: 2 mL

## 2019-03-14 MED ORDER — NITROGLYCERIN 1 MG/10 ML FOR IR/CATH LAB
INTRA_ARTERIAL | Status: AC
  Filled 2019-03-14: qty 10

## 2019-03-14 MED ORDER — MIDAZOLAM HCL 2 MG/2ML IJ SOLN
INTRAMUSCULAR | Status: DC | PRN
  Administered 2019-03-14 (×2): 1 mg via INTRAVENOUS

## 2019-03-14 MED ORDER — NITROGLYCERIN 1 MG/10 ML FOR IR/CATH LAB
INTRA_ARTERIAL | Status: DC | PRN
  Administered 2019-03-14: 200 ug via INTRACORONARY

## 2019-03-14 MED ORDER — ASPIRIN 81 MG PO CHEW
81.0000 mg | CHEWABLE_TABLET | ORAL | Status: AC
  Administered 2019-03-14: 81 mg via ORAL
  Filled 2019-03-14: qty 1

## 2019-03-14 MED ORDER — LABETALOL HCL 5 MG/ML IV SOLN: 10.0000 mg | INTRAVENOUS | Status: DC | PRN

## 2019-03-14 SURGICAL SUPPLY — 14 items
CATH LAUNCHER 6FR EBU 3 (CATHETERS) ×1 IMPLANT
CATH OPTITORQUE TIG 4.0 5F (CATHETERS) ×1 IMPLANT
DEVICE RAD COMP TR BAND LRG (VASCULAR PRODUCTS) ×1 IMPLANT
GLIDESHEATH SLEND A-KIT 6F 22G (SHEATH) ×1 IMPLANT
GUIDEWIRE INQWIRE 1.5J.035X260 (WIRE) IMPLANT
GUIDEWIRE PRESSURE COMET II (WIRE) ×1 IMPLANT
INQWIRE 1.5J .035X260CM (WIRE) ×2
KIT HEART LEFT (KITS) ×2 IMPLANT
KIT HEMO VALVE WATCHDOG (MISCELLANEOUS) ×1 IMPLANT
PACK CARDIAC CATHETERIZATION (CUSTOM PROCEDURE TRAY) ×2 IMPLANT
SYR 10CC CONTROL (SYRINGE) ×1 IMPLANT
TRANSDUCER W/STOPCOCK (MISCELLANEOUS) ×2 IMPLANT
TUBING CIL FLEX 10 FLL-RA (TUBING) ×2 IMPLANT
VALVE MANIFOLD 3 PORT W/RA/ON (MISCELLANEOUS) ×1 IMPLANT

## 2019-03-14 NOTE — Progress Notes (Signed)
Report given to Amy,RN who will assume care at this time

## 2019-03-14 NOTE — Interval H&P Note (Signed)
History and Physical Interval Note:  03/14/2019 2:04 PM  Jessica Shaw  has presented today for surgery, with the diagnosis of CAD.  The various methods of treatment have been discussed with the patient and family. After consideration of risks, benefits and other options for treatment, the patient has consented to  Procedure(s): LEFT HEART CATH AND CORONARY ANGIOGRAPHY (N/A) as a surgical intervention.  The patient's history has been reviewed, patient examined, no change in status, stable for surgery.  I have reviewed the patient's chart and labs.  Questions were answered to the patient's satisfaction.    2016/2017 Appropriate Use Criteria for Coronary Revascularization Clinical Presentation: Diabetes Mellitus? Symptom Status? S/P CABG? Antianginal Therapy (# of long-acting drugs)? Results of Non-invasive testing? FFR/iFR results in all diseased vessels? Patient undergoing renal transplant? Patient undergoing percutaneous valve procedure (TAVR, MitraClip, Others)? Symptom Status:  Ischemic Symptoms  Non-invasive Testing:  High risk  If no or indeterminate stress test, FFR/iFR results in all diseased vessels:  N/A  Diabetes Mellitus:  No  S/P CABG:  No  Antianginal therapy (number of long-acting drugs):  1  Patient undergoing renal transplant:  No  Patient undergoing percutaneous valve procedure:  No    newline 1 Vessel Disease PCI CABG  No proximal LAD involvement, No proximal left dominant LCX involvement M (6); Indication 2 M (4); Indication 2   Proximal left dominant LCX involvement A (7); Indication 5 A (7); Indication 5   Proximal LAD involvement A (7); Indication 5 A (7); Indication 5   newline 2 Vessel Disease  No proximal LAD involvement A (7); Indication 8 M (6); Indication 8   Proximal LAD involvement A (7); Indication 11 A (7); Indication 11   newline 3 Vessel Disease  Low disease complexity (e.g., focal stenoses, SYNTAX <=22) A (7); Indication 17 A (8); Indication 17    Intermediate or high disease complexity (e.g., SYNTAX >=23) M (6); Indication 21 A (8); Indication 21   newline Left Main Disease  Isolated LMCA disease: ostial or midshaft A (7); Indication 24 A (9); Indication 24   Isolated LMCA disease: bifurcation involvement M (5); Indication 25 A (9); Indication 25   LMCA ostial or midshaft, concurrent low disease burden multivessel disease (e.g., 1-2 additional focal stenoses, SYNTAX <=22) A (7); Indication 26 A (9); Indication 26   LMCA ostial or midshaft, concurrent intermediate or high disease burden multivessel disease (e.g., 1-2 additional bifurcation stenoses, long stenoses, SYNTAX >=23) M (4); Indication 27 A (9); Indication 27   LMCA bifurcation involvement, concurrent low disease burden multivessel disease (e.g., 1-2 additional focal stenoses, SYNTAX <=22) M (5); Indication 28 A (9); Indication 28   LMCA bifurcation involvement, concurrent intermediate or high disease burden multivessel disease (e.g., 1-2 additional bifurcation stenoses, long stenoses, SYNTAX >=23) R (3); Indication 29 A (9); Indication [place]

## 2019-03-14 NOTE — Discharge Instructions (Signed)
Radial Site Care ° °This sheet gives you information about how to care for yourself after your procedure. Your health care provider may also give you more specific instructions. If you have problems or questions, contact your health care provider. °What can I expect after the procedure? °After the procedure, it is common to have: °· Bruising and tenderness at the catheter insertion area. °Follow these instructions at home: °Medicines °· Take over-the-counter and prescription medicines only as told by your health care provider. °Insertion site care °· Follow instructions from your health care provider about how to take care of your insertion site. Make sure you: °? Wash your hands with soap and water before you change your bandage (dressing). If soap and water are not available, use hand sanitizer. °? Change your dressing as told by your health care provider. °? Leave stitches (sutures), skin glue, or adhesive strips in place. These skin closures may need to stay in place for 2 weeks or longer. If adhesive strip edges start to loosen and curl up, you may trim the loose edges. Do not remove adhesive strips completely unless your health care provider tells you to do that. °· Check your insertion site every day for signs of infection. Check for: °? Redness, swelling, or pain. °? Fluid or blood. °? Pus or a bad smell. °? Warmth. °· Do not take baths, swim, or use a hot tub until your health care provider approves. °· You may shower 24-48 hours after the procedure, or as directed by your health care provider. °? Remove the dressing and gently wash the site with plain soap and water. °? Pat the area dry with a clean towel. °? Do not rub the site. That could cause bleeding. °· Do not apply powder or lotion to the site. °Activity ° °· For 24 hours after the procedure, or as directed by your health care provider: °? Do not flex or bend the affected arm. °? Do not push or pull heavy objects with the affected arm. °? Do not  drive yourself home from the hospital or clinic. You may drive 24 hours after the procedure unless your health care provider tells you not to. °? Do not operate machinery or power tools. °· Do not lift anything that is heavier than 10 lb (4.5 kg), or the limit that you are told, until your health care provider says that it is safe. °· Ask your health care provider when it is okay to: °? Return to work or school. °? Resume usual physical activities or sports. °? Resume sexual activity. °General instructions °· If the catheter site starts to bleed, raise your arm and put firm pressure on the site. If the bleeding does not stop, get help right away. This is a medical emergency. °· If you went home on the same day as your procedure, a responsible adult should be with you for the first 24 hours after you arrive home. °· Keep all follow-up visits as told by your health care provider. This is important. °Contact a health care provider if: °· You have a fever. °· You have redness, swelling, or yellow drainage around your insertion site. °Get help right away if: °· You have unusual pain at the radial site. °· The catheter insertion area swells very fast. °· The insertion area is bleeding, and the bleeding does not stop when you hold steady pressure on the area. °· Your arm or hand becomes pale, cool, tingly, or numb. °These symptoms may represent a serious problem   that is an emergency. Do not wait to see if the symptoms will go away. Get medical help right away. Call your local emergency services (911 in the [place] Do not drive yourself to the hospital. °Summary °· After the procedure, it is common to have bruising and tenderness at the site. °· Follow instructions from your health care provider about how to take care of your radial site wound. Check the wound every day for signs of infection. °· Do not lift anything that is heavier than 10 lb (4.5 kg), or the limit that you are told, until your health care provider says  that it is safe. °This information is not intended to replace advice given to you by your health care provider. Make sure you discuss any questions you have with your health care provider. °Document Released: 07/04/2010 Document Revised: 07/07/2017 Document Reviewed: 07/07/2017 °Elsevier Patient Education © 2020 [employer] ° °

## 2019-03-15 ENCOUNTER — Encounter (HOSPITAL_COMMUNITY): Payer: Self-pay | Admitting: Cardiology

## 2019-03-16 ENCOUNTER — Encounter: Payer: Self-pay | Admitting: Cardiology

## 2019-03-16 ENCOUNTER — Ambulatory Visit (INDEPENDENT_AMBULATORY_CARE_PROVIDER_SITE_OTHER): Payer: BC Managed Care – PPO | Admitting: Cardiology

## 2019-03-16 ENCOUNTER — Other Ambulatory Visit: Payer: Self-pay

## 2019-03-16 VITALS — BP 118/80 | HR 84 | Temp 98.4°F | Ht 62.0 in | Wt 151.0 lb

## 2019-03-16 DIAGNOSIS — R0609 Other forms of dyspnea: Secondary | ICD-10-CM

## 2019-03-16 DIAGNOSIS — I25118 Atherosclerotic heart disease of native coronary artery with other forms of angina pectoris: Secondary | ICD-10-CM | POA: Diagnosis not present

## 2019-03-16 DIAGNOSIS — E782 Mixed hyperlipidemia: Secondary | ICD-10-CM | POA: Diagnosis not present

## 2019-03-16 DIAGNOSIS — R0600 Dyspnea, unspecified: Secondary | ICD-10-CM | POA: Diagnosis not present

## 2019-03-16 MED ORDER — AMLODIPINE BESYLATE 2.5 MG PO TABS: 2.5000 mg | ORAL_TABLET | Freq: Every day | ORAL | 2 refills | Status: DC

## 2019-03-16 NOTE — Progress Notes (Signed)
Follow up visit  Subjective:   Jessica Shaw, female    DOB: 12-06-1965, 53 y.o.   MRN: [id number]   Chief Complaint  Patient presents with  . Shortness of Breath  . Follow-up    HPI  53 year old Caucasian female with history of stroke and PFO, status post PFO closure in 2006, now with exertional dyspnea, chest/neck pressure, high risk stress test.  Coronary angiogram showed mid LAD 50%, ostial diag 40% stenosis with negative dFR. There was slow flow seen in all vessels, s/o microvascular disease.She still has episodes of chest and neck pressure, that sometimes occur with rest, and at times with exertion. However, most days she is able to walk for 2 miles without significant difficulty.    Past Medical History:  Diagnosis Date  . Complication of anesthesia   . Coronary artery disease    PFO closure 2006  . Embolism - blood clot    arm  . GERD (gastroesophageal reflux disease)   . HLD (hyperlipidemia)   . Migraine   . PONV (postoperative nausea and vomiting)   . Sleep apnea    mild OSA, no CPAP  . Smoker   . Spinal cord stimulator status 2006   in lower back, pt patient  . Stroke [facility])    TIA, no deficits     Past Surgical History:  Procedure Laterality Date  . CHOLECYSTECTOMY    . COLONOSCOPY  08/04/2005   [id number] external hemorrhoids and anal papilla, otherwise normal  . COLONOSCOPY N/A 10/07/2012   Procedure: COLONOSCOPY;  Surgeon: Daneil Dolin, MD;  [facility];  Service: Endoscopy;  Laterality: N/A;  10:30  . COLONOSCOPY N/A 10/13/2016   Procedure: COLONOSCOPY;  Surgeon: Daneil Dolin, MD;  [facility];  Service: Endoscopy;  Laterality: N/A;  9:00am  . ESOPHAGOGASTRODUODENOSCOPY  04/20/2003   [id number] changes of reflux esophagitis/Limited gastroesophageal junction, otherwise normal  . ESOPHAGOGASTRODUODENOSCOPY N/A 10/07/2012   Procedure: ESOPHAGOGASTRODUODENOSCOPY (EGD);  Surgeon: Daneil Dolin, MD;  [facility];   Service: Endoscopy;  Laterality: N/A;  . INTRAVASCULAR PRESSURE WIRE/FFR STUDY N/A 03/14/2019   Procedure: INTRAVASCULAR PRESSURE WIRE/FFR STUDY;  Surgeon: Nigel Mormon, MD;  Location: [place] [facility];  Service: Cardiovascular;  Laterality: N/A;  . LEFT HEART CATH AND CORONARY ANGIOGRAPHY N/A 03/14/2019   Procedure: LEFT HEART CATH AND CORONARY ANGIOGRAPHY;  Surgeon: Nigel Mormon, MD;  Location: [place] [facility];  Service: Cardiovascular;  Laterality: N/A;  . PATENT FORAMEN OVALE CLOSURE    . POLYPECTOMY  10/13/2016   Procedure: POLYPECTOMY;  Surgeon: Daneil Dolin, MD;  [facility];  Service: Endoscopy;;  colon  . SHOULDER ARTHROSCOPY     left  . SPINAL CORD STIMULATOR IMPLANT    . TUBAL LIGATION    . WRIST ARTHROSCOPY WITH DEBRIDEMENT Right 06/23/2018   Procedure: RIGHT ARTHROSCOPY WRIST DEBRIDEMENT LUNO TRIQUETRAL TEAR SHRINKAGE;  Surgeon: Daryll Brod, MD;  Location: [place];  Service: Orthopedics;  Laterality: Right;  AXILLARY BLAOCK     Social History   Socioeconomic History  . Marital status: Married    Spouse name: Not on file  . Number of children: 2  . Years of education: Not on file  . Highest education level: Not on file  Occupational History  . Occupation: Heritage manager: [employer]  Social Needs  . Financial resource strain: Not on file  . Food insecurity    Worry: Not on  file    Inability: Not on file  . Transportation needs    Medical: Not on file    Non-medical: Not on file  Tobacco Use  . Smoking status: Current Every Day Smoker    Packs/day: 0.50    Years: 36.00    Pack years: 18.00    Types: Cigarettes  . Smokeless tobacco: Never Used  Substance and Sexual Activity  . Alcohol use: No  . Drug use: No  . Sexual activity: Yes  Lifestyle  . Physical activity    Days per week: Not on file    Minutes per session: Not on file  . Stress: Not on file  Relationships  . Social Product manager on phone: Not on file    Gets together: Not on file    Attends religious service: Not on file    Active member of club or organization: Not on file    Attends meetings of clubs or organizations: Not on file    Relationship status: Not on file  . Intimate partner violence    Fear of current or ex partner: Not on file    Emotionally abused: Not on file    Physically abused: Not on file    Forced sexual activity: Not on file  Other Topics Concern  . Not on file  Social History Narrative         She works as a Pharmacist, hospital at [employer].   Does no exercise.   Has smoked about 1/2 ppd since age 53 years old-- now this has been for 30 years.(as of 2015)     Family History  Problem Relation Age of Onset  . Diabetes Father   . Heart attack Father   . Diabetes Brother   . Hypertension Brother   . Diabetes Brother   . Hypertension Brother   . Diabetes Brother   . Diabetes Brother   . Diabetes Brother   . Cancer Mother        unsure primary, deceased at age 21  . Diabetes Other   . Colon cancer Neg Hx      Current Outpatient Medications on File Prior to Visit  Medication Sig Dispense Refill  . aspirin EC 81 MG tablet Take 81 mg by mouth daily.    Marland Kitchen atorvastatin (LIPITOR) 80 MG tablet Take 1 tablet (80 mg total) by mouth daily at 6 PM. (Patient taking differently: Take 80 mg by mouth daily. ) 90 tablet 3  . eletriptan (RELPAX) 40 MG tablet TAKE 1 TABLET BY MOUTH ONCE DAILY AS NEEDED FOR  MIGRAINE **REPEAT IN 2 HOURS IF NEEDED** (Patient taking differently: Take 40 mg by mouth every 2 (two) hours as needed for migraine. ) 60 tablet 1  . Ibuprofen (ADVIL MIGRAINE) 200 MG CAPS Take 400 mg by mouth daily as needed (migraines).     . metoprolol succinate (TOPROL-XL) 50 MG 24 hr tablet Take 1 tablet (50 mg total) by mouth daily. Take with or immediately following a meal. 30 tablet 3  . nitroGLYCERIN (NITROSTAT) 0.4 MG SL tablet Place 1 tablet (0.4  mg total) under the tongue every 5 (five) minutes as needed for chest pain. 90 tablet 3  . omeprazole (PRILOSEC) 20 MG capsule Take 1 capsule (20 mg total) by mouth daily. 90 capsule 3   No current facility-administered medications on file prior to visit.     Cardiovascular studies:  EKG 03/16/2019: Sinus rhythm 74 bpm. Normal  EKG.  Coronary angiography 03/14/2019: LM: Normal LAD: Mid LAD 50% and ostial Diag 40% stenosis at LAD/Diag bifurcation. LAD tapers rapidly in size after the bifurcation. dFR LAD: 0.90 Diag 0.97 LCx: Normal RCA: Distal RCA 20% disease.  LVEDP normal.  Moderate nonobstructive epicardial CAD. Possible microvascular disease, as evidenced by slow TIMI 2 flow in all vessels. Recommend medical management.   EKG 03/09/2019: Sinus rhythm 95 bpm with rate variation  Nonspecific ST-T changes  Exercise Sestamibi Stress Test 03/06/19  Patient exercised on Bruce protocol. The resting electrocardiogram demonstrated normal sinus rhythm, normal resting conduction. Patient exercised on Bruce protocol for 6:00  minutes and achieved  92 % of Max Predicted HR (Target HR was >85% MPHR) and 7.04 METS. Stress symptoms included fatigue. Normal BP response.  Exercise capacity was normal. Perfusion imaging study demonstrates a medium-sized moderate defect noted from the base towards the apex antero-septal wall.  This defect is not noted on rest images, consistent with ischemia.  Left ventricle systolic function calculated by QGS was moderately depressed at 37%.  There is septal hypokinesis.  High risk study. No previous exam available for comparison.   EKG 02/03/2019: Sinus rhythm 81 bpm. Normal EKG.  Echocardiogram 11/04/2017: Left ventricle cavity is normal in size. Normal global wall motion. Normal diastolic filling pattern. Calculated EF 56%. Left atrial cavity is normal in size. There is a ASD closure device without residual leak or thrombus. IVC is dilated with  respiratory variation. May suggest elevated central venous pressure.  TEE 2006: - The left ventricle was normal.  - The septal occluder is well visualized. There are 2 small mobile     thrombi noted on tee left atrial side. The left atrial     appendage function was normal (normal emptying velocity).     There was no left atrial appendage thrombus identified.  - The septal occluder device is located away from the pulmonary     vein. The pulmonary veins were normal.    Recent labs: Results for RAASHIDA, MORANDO" (MRN [id number]) as of 02/03/2019 08:36  Ref. Range 01/27/2019 08:30 01/27/2019 08:37  COMPREHENSIVE METABOLIC PANEL Unknown  Rpt (A)  Sodium Latest Ref Range: 135 - 145 mmol/L  136  Potassium Latest Ref Range: 3.5 - 5.1 mmol/L  4.2  Chloride Latest Ref Range: 98 - 111 mmol/L  103  CO2 Latest Ref Range: 22 - 32 mmol/L  22  Glucose Latest Ref Range: 70 - 99 mg/dL  105 (H)  Mean Plasma Glucose Latest Units: mg/dL 125.5   BUN Latest Ref Range: 6 - 20 mg/dL  26 (H)  Creatinine Latest Ref Range: 0.44 - 1.00 mg/dL  0.61  Calcium Latest Ref Range: 8.9 - 10.3 mg/dL  9.5  Anion gap Latest Ref Range: 5 - 15   11  Alkaline Phosphatase Latest Ref Range: 38 - 126 U/L  67  Albumin Latest Ref Range: 3.5 - 5.0 g/dL  4.3  AST Latest Ref Range: 15 - 41 U/L  16  ALT Latest Ref Range: 0 - 44 U/L  22  Total Protein Latest Ref Range: 6.5 - 8.1 g/dL  6.9  Total Bilirubin Latest Ref Range: 0.3 - 1.2 mg/dL  0.7  GFR, Est Non African American Latest Ref Range: >60 mL/min  >60  GFR, Est African American Latest Ref Range: >60 mL/min  >60  Total CHOL/HDL Ratio Latest Units: RATIO 7.4   Cholesterol Latest Ref Range: 0 - 200 mg/dL 259 (H)   HDL Cholesterol Latest  Ref Range: >40 mg/dL 35 (L)   LDL (calc) Latest Ref Range: 0 - 99 mg/dL 182 (H)   Triglycerides Latest Ref Range: <150 mg/dL 210 (H)   VLDL Latest Ref Range: 0 - 40 mg/dL 42 (H)    Results for ROMONA, GRZYWACZ" (MRN [id number]) as of 02/03/2019 08:36  Ref. Range 04/29/2018 08:27  WBC Latest Ref Range: 3.8 - 10.8 Thousand/uL 11.2 (H)  WBC mixed population Latest Ref Range: 200 - 950 cells/uL 549  RBC Latest Ref Range: 3.80 - 5.10 Million/uL 4.88  Hemoglobin Latest Ref Range: 11.7 - 15.5 g/dL 14.8  HCT Latest Ref Range: 35.0 - 45.0 % 43.1  MCV Latest Ref Range: 80.0 - 100.0 fL 88.3  MCH Latest Ref Range: 27.0 - 33.0 pg 30.3  MCHC Latest Ref Range: 32.0 - 36.0 g/dL 34.3  RDW Latest Ref Range: 11.0 - 15.0 % 12.8  Platelets Latest Ref Range: 140 - 400 Thousand/uL 296  MPV Latest Ref Range: 7.5 - 12.5 fL 10.7   Results for GEARLEAN, CURNUTTE" (MRN [id number]) as of 02/03/2019 08:36  Ref. Range 01/27/2019 08:29 01/27/2019 08:30 01/27/2019 08:37  Glucose Latest Ref Range: 70 - 99 mg/dL   105 (H)  Hemoglobin A1C Latest Ref Range: 4.8 - 5.6 %  6.0 (H)   TSH Latest Ref Range: 0.350 - 4.500 uIU/mL  0.830   T4,Free(Direct) Latest Ref Range: 0.61 - 1.12 ng/dL 0.91     Results for WILLETTA, MESSINA" (MRN [id number]) as of 02/03/2019 14:28  Ref. Range 01/27/2019 08:30  Total CHOL/HDL Ratio Latest Units: RATIO 7.4  Cholesterol Latest Ref Range: 0 - 200 mg/dL 259 (H)  HDL Cholesterol Latest Ref Range: >40 mg/dL 35 (L)  LDL (calc) Latest Ref Range: 0 - 99 mg/dL 182 (H)  Triglycerides Latest Ref Range: <150 mg/dL 210 (H)  VLDL Latest Ref Range: 0 - 40 mg/dL 42 (H)    Review of Systems  Constitution: Negative for decreased appetite, malaise/fatigue, weight gain and weight loss.  HENT: Negative for congestion.        "Pressure in her neck sensation"  Eyes: Negative for visual disturbance.  Cardiovascular: Positive for dyspnea on exertion and palpitations. Negative for chest pain, leg swelling and syncope.  Respiratory: Negative for cough.   Endocrine: Negative for cold intolerance.  Hematologic/Lymphatic: Does not bruise/bleed easily.  Skin: Negative for itching and rash.  Musculoskeletal:  Negative for myalgias.  Gastrointestinal: Negative for abdominal pain, nausea and vomiting.  Genitourinary: Negative for dysuria.  Neurological: Negative for dizziness and weakness.  Psychiatric/Behavioral: The patient is not nervous/anxious.   All other systems reviewed and are negative.       Vitals:   03/16/19 1523  BP: 118/80  Pulse: 84  Temp: 98.4 F (36.9 C)  SpO2: 98%    Body mass index is 27.62 kg/m. Filed Weights   03/16/19 1523  Weight: 68.5 kg     Objective:   Physical Exam  Constitutional: She is oriented to person, place, and time. She appears well-developed and well-nourished. No distress.  HENT:  Head: Normocephalic and atraumatic.  Eyes: Pupils are equal, round, and reactive to light. Conjunctivae are normal.  Neck: No JVD present.  Cardiovascular: Normal rate, regular rhythm and intact distal pulses.  Pulmonary/Chest: Effort normal and breath sounds normal. She has no wheezes. She has no rales.  Abdominal: Soft. Bowel sounds are normal. There is no rebound.  Musculoskeletal:        General: No edema.  Lymphadenopathy:    She has no cervical adenopathy.  Neurological: She is alert and oriented to person, place, and time. No cranial nerve deficit.  Skin: Skin is warm and dry.  Psychiatric: She has a normal mood and affect.  Nursing note and vitals reviewed.         Assessment & Recommendations:   53 year old Caucasian female with history of stroke and PFO, status post PFO closure in 2006, moderate nonobstructive CAD, probably microvascular dysfunction.  Exertional chest pain, dyspnea: Added amlodipine 2.5 mg daily as a vasodilator agent, a.ong with prn nitroglycerin.   Moderate nonobstructive CAD: Negative dFR LAD and Diag. Continue Aspirin, lipitor 80 mg. LDL significantly higher than previous check. Will repeat.  F/u in 4 weeks.    Nigel Mormon, MD [facility] [place] Pager: [phone] Office: [phone] If no  answer Cell [phone]

## 2019-03-22 ENCOUNTER — Telehealth: Payer: BC Managed Care – PPO | Admitting: Cardiology

## 2019-03-23 DIAGNOSIS — M25511 Pain in right shoulder: Secondary | ICD-10-CM | POA: Diagnosis not present

## 2019-03-23 DIAGNOSIS — M7551 Bursitis of right shoulder: Secondary | ICD-10-CM | POA: Diagnosis not present

## 2019-03-23 DIAGNOSIS — G8929 Other chronic pain: Secondary | ICD-10-CM | POA: Diagnosis not present

## 2019-03-23 DIAGNOSIS — M7501 Adhesive capsulitis of right shoulder: Secondary | ICD-10-CM | POA: Diagnosis not present

## 2019-04-10 NOTE — Progress Notes (Deleted)
Follow up visit  Subjective:   Jessica Shaw, female    DOB: July 10, 1965, 53 y.o.   MRN: [id number]   No chief complaint on file.   HPI  54 year old Caucasian female with history of stroke and PFO, status post PFO closure in 2006, now with exertional dyspnea, chest/neck pressure, high risk stress test.  Coronary angiogram showed mid LAD 50%, ostial diag 40% stenosis with negative dFR. There was slow flow seen in all vessels, s/o microvascular disease.She still has episodes of chest and neck pressure, that sometimes occur with rest, and at times with exertion. However, most days she is able to walk for 2 miles without significant difficulty.    Past Medical History:  Diagnosis Date  . Complication of anesthesia   . Coronary artery disease    PFO closure 2006  . Embolism - blood clot    arm  . GERD (gastroesophageal reflux disease)   . HLD (hyperlipidemia)   . Migraine   . PONV (postoperative nausea and vomiting)   . Sleep apnea    mild OSA, no CPAP  . Smoker   . Spinal cord stimulator status 2006   in lower back, pt patient  . Stroke [facility])    TIA, no deficits     Past Surgical History:  Procedure Laterality Date  . CHOLECYSTECTOMY    . COLONOSCOPY  08/04/2005   [id number] external hemorrhoids and anal papilla, otherwise normal  . COLONOSCOPY N/A 10/07/2012   Procedure: COLONOSCOPY;  Surgeon: Daneil Dolin, MD;  [facility];  Service: Endoscopy;  Laterality: N/A;  10:30  . COLONOSCOPY N/A 10/13/2016   Procedure: COLONOSCOPY;  Surgeon: Daneil Dolin, MD;  [facility];  Service: Endoscopy;  Laterality: N/A;  9:00am  . ESOPHAGOGASTRODUODENOSCOPY  04/20/2003   [id number] changes of reflux esophagitis/Limited gastroesophageal junction, otherwise normal  . ESOPHAGOGASTRODUODENOSCOPY N/A 10/07/2012   Procedure: ESOPHAGOGASTRODUODENOSCOPY (EGD);  Surgeon: Daneil Dolin, MD;  [facility];  Service: Endoscopy;  Laterality: N/A;  .  INTRAVASCULAR PRESSURE WIRE/FFR STUDY N/A 03/14/2019   Procedure: INTRAVASCULAR PRESSURE WIRE/FFR STUDY;  Surgeon: Nigel Mormon, MD;  Location: [place] [facility];  Service: Cardiovascular;  Laterality: N/A;  . LEFT HEART CATH AND CORONARY ANGIOGRAPHY N/A 03/14/2019   Procedure: LEFT HEART CATH AND CORONARY ANGIOGRAPHY;  Surgeon: Nigel Mormon, MD;  Location: [place] [facility];  Service: Cardiovascular;  Laterality: N/A;  . PATENT FORAMEN OVALE CLOSURE    . POLYPECTOMY  10/13/2016   Procedure: POLYPECTOMY;  Surgeon: Daneil Dolin, MD;  [facility];  Service: Endoscopy;;  colon  . SHOULDER ARTHROSCOPY     left  . SPINAL CORD STIMULATOR IMPLANT    . TUBAL LIGATION    . WRIST ARTHROSCOPY WITH DEBRIDEMENT Right 06/23/2018   Procedure: RIGHT ARTHROSCOPY WRIST DEBRIDEMENT LUNO TRIQUETRAL TEAR SHRINKAGE;  Surgeon: Daryll Brod, MD;  Location: [place];  Service: Orthopedics;  Laterality: Right;  AXILLARY BLAOCK     Social History   Socioeconomic History  . Marital status: Married    Spouse name: Not on file  . Number of children: 2  . Years of education: Not on file  . Highest education level: Not on file  Occupational History  . Occupation: Heritage manager: [employer]  Social Needs  . Financial resource strain: Not on file  . Food insecurity    Worry: Not on file    Inability: Not on file  .  Transportation needs    Medical: Not on file    Non-medical: Not on file  Tobacco Use  . Smoking status: Current Every Day Smoker    Packs/day: 0.50    Years: 36.00    Pack years: 18.00    Types: Cigarettes  . Smokeless tobacco: Never Used  Substance and Sexual Activity  . Alcohol use: No  . Drug use: No  . Sexual activity: Yes  Lifestyle  . Physical activity    Days per week: Not on file    Minutes per session: Not on file  . Stress: Not on file  Relationships  . Social Herbalist on phone: Not on file    Gets  together: Not on file    Attends religious service: Not on file    Active member of club or organization: Not on file    Attends meetings of clubs or organizations: Not on file    Relationship status: Not on file  . Intimate partner violence    Fear of current or ex partner: Not on file    Emotionally abused: Not on file    Physically abused: Not on file    Forced sexual activity: Not on file  Other Topics Concern  . Not on file  Social History Narrative         She works as a Pharmacist, hospital at [employer].   Does no exercise.   Has smoked about 1/2 ppd since age 70 years old-- now this has been for 30 years.(as of 2015)     Family History  Problem Relation Age of Onset  . Diabetes Father   . Heart attack Father   . Diabetes Brother   . Hypertension Brother   . Diabetes Brother   . Hypertension Brother   . Diabetes Brother   . Diabetes Brother   . Diabetes Brother   . Cancer Mother        unsure primary, deceased at age 37  . Diabetes Other   . Colon cancer Neg Hx      Current Outpatient Medications on File Prior to Visit  Medication Sig Dispense Refill  . amLODipine (NORVASC) 2.5 MG tablet Take 1 tablet (2.5 mg total) by mouth daily. 30 tablet 2  . aspirin EC 81 MG tablet Take 81 mg by mouth daily.    Marland Kitchen atorvastatin (LIPITOR) 80 MG tablet Take 1 tablet (80 mg total) by mouth daily at 6 PM. (Patient taking differently: Take 80 mg by mouth daily. ) 90 tablet 3  . eletriptan (RELPAX) 40 MG tablet TAKE 1 TABLET BY MOUTH ONCE DAILY AS NEEDED FOR  MIGRAINE **REPEAT IN 2 HOURS IF NEEDED** (Patient taking differently: Take 40 mg by mouth every 2 (two) hours as needed for migraine. ) 60 tablet 1  . Ibuprofen (ADVIL MIGRAINE) 200 MG CAPS Take 400 mg by mouth daily as needed (migraines).     . metoprolol succinate (TOPROL-XL) 50 MG 24 hr tablet Take 1 tablet (50 mg total) by mouth daily. Take with or immediately following a meal. 30 tablet 3  .  nitroGLYCERIN (NITROSTAT) 0.4 MG SL tablet Place 1 tablet (0.4 mg total) under the tongue every 5 (five) minutes as needed for chest pain. 90 tablet 3  . omeprazole (PRILOSEC) 20 MG capsule Take 1 capsule (20 mg total) by mouth daily. 90 capsule 3   No current facility-administered medications on file prior to visit.     Cardiovascular  studies:  Event monitor 03/02/2019-03/31/2019: Sinus rhythm.  Heart rate 56-136 bpm.  Average heart rate 79 bpm. Occasional PVCs, otherwise no arrhythmias noted. No symptoms reported.  EKG 03/16/2019: Sinus rhythm 74 bpm. Normal EKG.  Coronary angiography 03/14/2019: LM: Normal LAD: Mid LAD 50% and ostial Diag 40% stenosis at LAD/Diag bifurcation. LAD tapers rapidly in size after the bifurcation. dFR LAD: 0.90 Diag 0.97 LCx: Normal RCA: Distal RCA 20% disease.  LVEDP normal.  Moderate nonobstructive epicardial CAD. Possible microvascular disease, as evidenced by slow TIMI 2 flow in all vessels. Recommend medical management.   EKG 03/09/2019: Sinus rhythm 95 bpm with rate variation  Nonspecific ST-T changes  Exercise Sestamibi Stress Test 03/06/19  Patient exercised on Bruce protocol. The resting electrocardiogram demonstrated normal sinus rhythm, normal resting conduction. Patient exercised on Bruce protocol for 6:00  minutes and achieved  92 % of Max Predicted HR (Target HR was >85% MPHR) and 7.04 METS. Stress symptoms included fatigue. Normal BP response.  Exercise capacity was normal. Perfusion imaging study demonstrates a medium-sized moderate defect noted from the base towards the apex antero-septal wall.  This defect is not noted on rest images, consistent with ischemia.  Left ventricle systolic function calculated by QGS was moderately depressed at 37%.  There is septal hypokinesis.  High risk study. No previous exam available for comparison.   EKG 02/03/2019: Sinus rhythm 81 bpm. Normal EKG.  Echocardiogram 11/04/2017: Left ventricle  cavity is normal in size. Normal global wall motion. Normal diastolic filling pattern. Calculated EF 56%. Left atrial cavity is normal in size. There is a ASD closure device without residual leak or thrombus. IVC is dilated with respiratory variation. May suggest elevated central venous pressure.  TEE 2006: - The left ventricle was normal.  - The septal occluder is well visualized. There are 2 small mobile     thrombi noted on tee left atrial side. The left atrial     appendage function was normal (normal emptying velocity).     There was no left atrial appendage thrombus identified.  - The septal occluder device is located away from the pulmonary     vein. The pulmonary veins were normal.    Recent labs: Results for NALEA, GUINANE" (MRN [id number]) as of 02/03/2019 08:36  Ref. Range 01/27/2019 08:30 01/27/2019 08:37  COMPREHENSIVE METABOLIC PANEL Unknown  Rpt (A)  Sodium Latest Ref Range: 135 - 145 mmol/L  136  Potassium Latest Ref Range: 3.5 - 5.1 mmol/L  4.2  Chloride Latest Ref Range: 98 - 111 mmol/L  103  CO2 Latest Ref Range: 22 - 32 mmol/L  22  Glucose Latest Ref Range: 70 - 99 mg/dL  105 (H)  Mean Plasma Glucose Latest Units: mg/dL 125.5   BUN Latest Ref Range: 6 - 20 mg/dL  26 (H)  Creatinine Latest Ref Range: 0.44 - 1.00 mg/dL  0.61  Calcium Latest Ref Range: 8.9 - 10.3 mg/dL  9.5  Anion gap Latest Ref Range: 5 - 15   11  Alkaline Phosphatase Latest Ref Range: 38 - 126 U/L  67  Albumin Latest Ref Range: 3.5 - 5.0 g/dL  4.3  AST Latest Ref Range: 15 - 41 U/L  16  ALT Latest Ref Range: 0 - 44 U/L  22  Total Protein Latest Ref Range: 6.5 - 8.1 g/dL  6.9  Total Bilirubin Latest Ref Range: 0.3 - 1.2 mg/dL  0.7  GFR, Est Non African American Latest Ref Range: >60 mL/min  >60  GFR,  Est African American Latest Ref Range: >60 mL/min  >60  Total CHOL/HDL Ratio Latest Units: RATIO 7.4   Cholesterol Latest Ref Range: 0 - 200 mg/dL 259 (H)   HDL  Cholesterol Latest Ref Range: >40 mg/dL 35 (L)   LDL (calc) Latest Ref Range: 0 - 99 mg/dL 182 (H)   Triglycerides Latest Ref Range: <150 mg/dL 210 (H)   VLDL Latest Ref Range: 0 - 40 mg/dL 42 (H)    Results for TYASIA, RIPPETOE" (MRN [id number]) as of 02/03/2019 08:36  Ref. Range 04/29/2018 08:27  WBC Latest Ref Range: 3.8 - 10.8 Thousand/uL 11.2 (H)  WBC mixed population Latest Ref Range: 200 - 950 cells/uL 549  RBC Latest Ref Range: 3.80 - 5.10 Million/uL 4.88  Hemoglobin Latest Ref Range: 11.7 - 15.5 g/dL 14.8  HCT Latest Ref Range: 35.0 - 45.0 % 43.1  MCV Latest Ref Range: 80.0 - 100.0 fL 88.3  MCH Latest Ref Range: 27.0 - 33.0 pg 30.3  MCHC Latest Ref Range: 32.0 - 36.0 g/dL 34.3  RDW Latest Ref Range: 11.0 - 15.0 % 12.8  Platelets Latest Ref Range: 140 - 400 Thousand/uL 296  MPV Latest Ref Range: 7.5 - 12.5 fL 10.7   Results for KEYARA, BILLS" (MRN [id number]) as of 02/03/2019 08:36  Ref. Range 01/27/2019 08:29 01/27/2019 08:30 01/27/2019 08:37  Glucose Latest Ref Range: 70 - 99 mg/dL   105 (H)  Hemoglobin A1C Latest Ref Range: 4.8 - 5.6 %  6.0 (H)   TSH Latest Ref Range: 0.350 - 4.500 uIU/mL  0.830   T4,Free(Direct) Latest Ref Range: 0.61 - 1.12 ng/dL 0.91     Results for DESHANTE, AHUMADA" (MRN [id number]) as of 02/03/2019 14:28  Ref. Range 01/27/2019 08:30  Total CHOL/HDL Ratio Latest Units: RATIO 7.4  Cholesterol Latest Ref Range: 0 - 200 mg/dL 259 (H)  HDL Cholesterol Latest Ref Range: >40 mg/dL 35 (L)  LDL (calc) Latest Ref Range: 0 - 99 mg/dL 182 (H)  Triglycerides Latest Ref Range: <150 mg/dL 210 (H)  VLDL Latest Ref Range: 0 - 40 mg/dL 42 (H)    Review of Systems  Constitution: Negative for decreased appetite, malaise/fatigue, weight gain and weight loss.  HENT: Negative for congestion.        "Pressure in her neck sensation"  Eyes: Negative for visual disturbance.  Cardiovascular: Positive for dyspnea on exertion and palpitations. Negative  for chest pain, leg swelling and syncope.  Respiratory: Negative for cough.   Endocrine: Negative for cold intolerance.  Hematologic/Lymphatic: Does not bruise/bleed easily.  Skin: Negative for itching and rash.  Musculoskeletal: Negative for myalgias.  Gastrointestinal: Negative for abdominal pain, nausea and vomiting.  Genitourinary: Negative for dysuria.  Neurological: Negative for dizziness and weakness.  Psychiatric/Behavioral: The patient is not nervous/anxious.   All other systems reviewed and are negative.      *** There were no vitals filed for this visit.  *** There is no height or weight on file to calculate BMI. There were no vitals filed for this visit.   Objective:   Physical Exam  Constitutional: She is oriented to person, place, and time. She appears well-developed and well-nourished. No distress.  HENT:  Head: Normocephalic and atraumatic.  Eyes: Pupils are equal, round, and reactive to light. Conjunctivae are normal.  Neck: No JVD present.  Cardiovascular: Normal rate, regular rhythm and intact distal pulses.  Pulmonary/Chest: Effort normal and breath sounds normal. She has no wheezes. She has no rales.  Abdominal: Soft. Bowel sounds are normal. There is no rebound.  Musculoskeletal:        General: No edema.  Lymphadenopathy:    She has no cervical adenopathy.  Neurological: She is alert and oriented to person, place, and time. No cranial nerve deficit.  Skin: Skin is warm and dry.  Psychiatric: She has a normal mood and affect.  Nursing note and vitals reviewed.         Assessment & Recommendations:   53 year old Caucasian female with history of stroke and PFO, status post PFO closure in 2006, moderate nonobstructive CAD, probably microvascular dysfunction.  Exertional chest pain, dyspnea: Added amlodipine 2.5 mg daily as a vasodilator agent, a.ong with prn nitroglycerin.   Moderate nonobstructive CAD: Negative dFR LAD and Diag. Continue  Aspirin, lipitor 80 mg. LDL significantly higher than previous check. Will repeat.  F/u in 4 weeks.    Nigel Mormon, MD [facility] Pager: [phone] Office: [phone] If no answer Cell [phone]

## 2019-04-12 ENCOUNTER — Telehealth: Payer: BC Managed Care – PPO | Admitting: Cardiology

## 2019-04-13 ENCOUNTER — Ambulatory Visit: Payer: BC Managed Care – PPO | Admitting: Cardiology

## 2019-04-14 ENCOUNTER — Other Ambulatory Visit: Payer: Self-pay

## 2019-04-14 DIAGNOSIS — Z20822 Contact with and (suspected) exposure to covid-19: Secondary | ICD-10-CM

## 2019-04-16 LAB — NOVEL CORONAVIRUS, NAA: SARS-CoV-2, NAA: NOT DETECTED

## 2019-04-17 DIAGNOSIS — M7501 Adhesive capsulitis of right shoulder: Secondary | ICD-10-CM | POA: Diagnosis not present

## 2019-04-17 DIAGNOSIS — M9907 Segmental and somatic dysfunction of upper extremity: Secondary | ICD-10-CM | POA: Diagnosis not present

## 2019-04-17 DIAGNOSIS — M9901 Segmental and somatic dysfunction of cervical region: Secondary | ICD-10-CM | POA: Diagnosis not present

## 2019-04-17 DIAGNOSIS — M542 Cervicalgia: Secondary | ICD-10-CM | POA: Diagnosis not present

## 2019-04-19 ENCOUNTER — Ambulatory Visit: Payer: BC Managed Care – PPO | Admitting: Cardiology

## 2019-04-19 DIAGNOSIS — M9907 Segmental and somatic dysfunction of upper extremity: Secondary | ICD-10-CM | POA: Diagnosis not present

## 2019-04-19 DIAGNOSIS — M7501 Adhesive capsulitis of right shoulder: Secondary | ICD-10-CM | POA: Diagnosis not present

## 2019-04-19 DIAGNOSIS — M542 Cervicalgia: Secondary | ICD-10-CM | POA: Diagnosis not present

## 2019-04-19 DIAGNOSIS — M7551 Bursitis of right shoulder: Secondary | ICD-10-CM | POA: Diagnosis not present

## 2019-04-19 DIAGNOSIS — M9901 Segmental and somatic dysfunction of cervical region: Secondary | ICD-10-CM | POA: Diagnosis not present

## 2019-04-19 DIAGNOSIS — M25511 Pain in right shoulder: Secondary | ICD-10-CM | POA: Diagnosis not present

## 2019-04-19 DIAGNOSIS — G8929 Other chronic pain: Secondary | ICD-10-CM | POA: Diagnosis not present

## 2019-04-21 ENCOUNTER — Other Ambulatory Visit: Payer: Self-pay

## 2019-04-21 DIAGNOSIS — M7501 Adhesive capsulitis of right shoulder: Secondary | ICD-10-CM | POA: Diagnosis not present

## 2019-04-21 DIAGNOSIS — Z20822 Contact with and (suspected) exposure to covid-19: Secondary | ICD-10-CM

## 2019-04-21 DIAGNOSIS — M9901 Segmental and somatic dysfunction of cervical region: Secondary | ICD-10-CM | POA: Diagnosis not present

## 2019-04-21 DIAGNOSIS — M9907 Segmental and somatic dysfunction of upper extremity: Secondary | ICD-10-CM | POA: Diagnosis not present

## 2019-04-21 DIAGNOSIS — M542 Cervicalgia: Secondary | ICD-10-CM | POA: Diagnosis not present

## 2019-04-21 NOTE — Addendum Note (Signed)
Addended by: Kathlene November on: 04/21/2019 08:18 AM   Modules accepted: Orders

## 2019-04-21 NOTE — Addendum Note (Signed)
Addended by: Kathlene November on: 04/21/2019 08:15 AM   Modules accepted: Orders

## 2019-04-23 LAB — NOVEL CORONAVIRUS, NAA: SARS-CoV-2, NAA: NOT DETECTED

## 2019-04-24 DIAGNOSIS — M9907 Segmental and somatic dysfunction of upper extremity: Secondary | ICD-10-CM | POA: Diagnosis not present

## 2019-04-24 DIAGNOSIS — M542 Cervicalgia: Secondary | ICD-10-CM | POA: Diagnosis not present

## 2019-04-24 DIAGNOSIS — M7501 Adhesive capsulitis of right shoulder: Secondary | ICD-10-CM | POA: Diagnosis not present

## 2019-04-24 DIAGNOSIS — M9901 Segmental and somatic dysfunction of cervical region: Secondary | ICD-10-CM | POA: Diagnosis not present

## 2019-04-26 DIAGNOSIS — M9907 Segmental and somatic dysfunction of upper extremity: Secondary | ICD-10-CM | POA: Diagnosis not present

## 2019-04-26 DIAGNOSIS — M9901 Segmental and somatic dysfunction of cervical region: Secondary | ICD-10-CM | POA: Diagnosis not present

## 2019-04-26 DIAGNOSIS — M7501 Adhesive capsulitis of right shoulder: Secondary | ICD-10-CM | POA: Diagnosis not present

## 2019-04-26 DIAGNOSIS — M542 Cervicalgia: Secondary | ICD-10-CM | POA: Diagnosis not present

## 2019-04-27 DIAGNOSIS — M9907 Segmental and somatic dysfunction of upper extremity: Secondary | ICD-10-CM | POA: Diagnosis not present

## 2019-04-27 DIAGNOSIS — M9901 Segmental and somatic dysfunction of cervical region: Secondary | ICD-10-CM | POA: Diagnosis not present

## 2019-04-27 DIAGNOSIS — M7501 Adhesive capsulitis of right shoulder: Secondary | ICD-10-CM | POA: Diagnosis not present

## 2019-04-27 DIAGNOSIS — M542 Cervicalgia: Secondary | ICD-10-CM | POA: Diagnosis not present

## 2019-05-01 DIAGNOSIS — M542 Cervicalgia: Secondary | ICD-10-CM | POA: Diagnosis not present

## 2019-05-01 DIAGNOSIS — M9907 Segmental and somatic dysfunction of upper extremity: Secondary | ICD-10-CM | POA: Diagnosis not present

## 2019-05-01 DIAGNOSIS — M7501 Adhesive capsulitis of right shoulder: Secondary | ICD-10-CM | POA: Diagnosis not present

## 2019-05-01 DIAGNOSIS — M9901 Segmental and somatic dysfunction of cervical region: Secondary | ICD-10-CM | POA: Diagnosis not present

## 2019-05-05 DIAGNOSIS — M9907 Segmental and somatic dysfunction of upper extremity: Secondary | ICD-10-CM | POA: Diagnosis not present

## 2019-05-05 DIAGNOSIS — M542 Cervicalgia: Secondary | ICD-10-CM | POA: Diagnosis not present

## 2019-05-05 DIAGNOSIS — M9901 Segmental and somatic dysfunction of cervical region: Secondary | ICD-10-CM | POA: Diagnosis not present

## 2019-05-05 DIAGNOSIS — M7501 Adhesive capsulitis of right shoulder: Secondary | ICD-10-CM | POA: Diagnosis not present

## 2019-05-09 DIAGNOSIS — M7501 Adhesive capsulitis of right shoulder: Secondary | ICD-10-CM | POA: Diagnosis not present

## 2019-05-09 DIAGNOSIS — M9901 Segmental and somatic dysfunction of cervical region: Secondary | ICD-10-CM | POA: Diagnosis not present

## 2019-05-09 DIAGNOSIS — M9907 Segmental and somatic dysfunction of upper extremity: Secondary | ICD-10-CM | POA: Diagnosis not present

## 2019-05-09 DIAGNOSIS — M542 Cervicalgia: Secondary | ICD-10-CM | POA: Diagnosis not present

## 2019-05-10 DIAGNOSIS — M542 Cervicalgia: Secondary | ICD-10-CM | POA: Diagnosis not present

## 2019-05-10 DIAGNOSIS — M7501 Adhesive capsulitis of right shoulder: Secondary | ICD-10-CM | POA: Diagnosis not present

## 2019-05-10 DIAGNOSIS — M9907 Segmental and somatic dysfunction of upper extremity: Secondary | ICD-10-CM | POA: Diagnosis not present

## 2019-05-10 DIAGNOSIS — M9901 Segmental and somatic dysfunction of cervical region: Secondary | ICD-10-CM | POA: Diagnosis not present

## 2019-05-15 DIAGNOSIS — M7501 Adhesive capsulitis of right shoulder: Secondary | ICD-10-CM | POA: Diagnosis not present

## 2019-05-15 DIAGNOSIS — M9907 Segmental and somatic dysfunction of upper extremity: Secondary | ICD-10-CM | POA: Diagnosis not present

## 2019-05-15 DIAGNOSIS — M9901 Segmental and somatic dysfunction of cervical region: Secondary | ICD-10-CM | POA: Diagnosis not present

## 2019-05-15 DIAGNOSIS — M542 Cervicalgia: Secondary | ICD-10-CM | POA: Diagnosis not present

## 2019-05-17 DIAGNOSIS — M542 Cervicalgia: Secondary | ICD-10-CM | POA: Diagnosis not present

## 2019-05-17 DIAGNOSIS — M7501 Adhesive capsulitis of right shoulder: Secondary | ICD-10-CM | POA: Diagnosis not present

## 2019-05-17 DIAGNOSIS — M9907 Segmental and somatic dysfunction of upper extremity: Secondary | ICD-10-CM | POA: Diagnosis not present

## 2019-05-17 DIAGNOSIS — M9901 Segmental and somatic dysfunction of cervical region: Secondary | ICD-10-CM | POA: Diagnosis not present

## 2019-05-22 DIAGNOSIS — M7501 Adhesive capsulitis of right shoulder: Secondary | ICD-10-CM | POA: Diagnosis not present

## 2019-05-22 DIAGNOSIS — M542 Cervicalgia: Secondary | ICD-10-CM | POA: Diagnosis not present

## 2019-05-22 DIAGNOSIS — M9907 Segmental and somatic dysfunction of upper extremity: Secondary | ICD-10-CM | POA: Diagnosis not present

## 2019-05-22 DIAGNOSIS — M9901 Segmental and somatic dysfunction of cervical region: Secondary | ICD-10-CM | POA: Diagnosis not present

## 2019-05-24 DIAGNOSIS — M7501 Adhesive capsulitis of right shoulder: Secondary | ICD-10-CM | POA: Diagnosis not present

## 2019-05-24 DIAGNOSIS — M25511 Pain in right shoulder: Secondary | ICD-10-CM | POA: Diagnosis not present

## 2019-05-25 DIAGNOSIS — M7501 Adhesive capsulitis of right shoulder: Secondary | ICD-10-CM | POA: Diagnosis not present

## 2019-05-29 DIAGNOSIS — M7501 Adhesive capsulitis of right shoulder: Secondary | ICD-10-CM | POA: Diagnosis not present

## 2019-06-05 DIAGNOSIS — M7501 Adhesive capsulitis of right shoulder: Secondary | ICD-10-CM | POA: Diagnosis not present

## 2019-06-12 ENCOUNTER — Other Ambulatory Visit: Payer: Self-pay

## 2019-06-12 DIAGNOSIS — R0600 Dyspnea, unspecified: Secondary | ICD-10-CM

## 2019-06-12 DIAGNOSIS — R0609 Other forms of dyspnea: Secondary | ICD-10-CM

## 2019-06-12 DIAGNOSIS — I2510 Atherosclerotic heart disease of native coronary artery without angina pectoris: Secondary | ICD-10-CM

## 2019-06-12 DIAGNOSIS — R9439 Abnormal result of other cardiovascular function study: Secondary | ICD-10-CM

## 2019-06-12 MED ORDER — METOPROLOL SUCCINATE ER 50 MG PO TB24: 50.0000 mg | ORAL_TABLET | Freq: Every day | ORAL | 1 refills | Status: DC

## 2019-06-13 ENCOUNTER — Other Ambulatory Visit: Payer: Self-pay | Admitting: Cardiology

## 2019-06-13 DIAGNOSIS — I25118 Atherosclerotic heart disease of native coronary artery with other forms of angina pectoris: Secondary | ICD-10-CM

## 2019-06-26 DIAGNOSIS — M7501 Adhesive capsulitis of right shoulder: Secondary | ICD-10-CM | POA: Diagnosis not present

## 2019-07-07 DIAGNOSIS — M7501 Adhesive capsulitis of right shoulder: Secondary | ICD-10-CM | POA: Diagnosis not present

## 2019-07-07 IMAGING — MG DIGITAL SCREENING BILATERAL MAMMOGRAM WITH TOMO AND CAD
8 series · 8 of 24 positions shown · non-contrast
Comparison: Previous exam(s).

CLINICAL DATA: Screening.

EXAM:
DIGITAL SCREENING BILATERAL MAMMOGRAM WITH TOMO AND CAD

[R CC synth-2D]
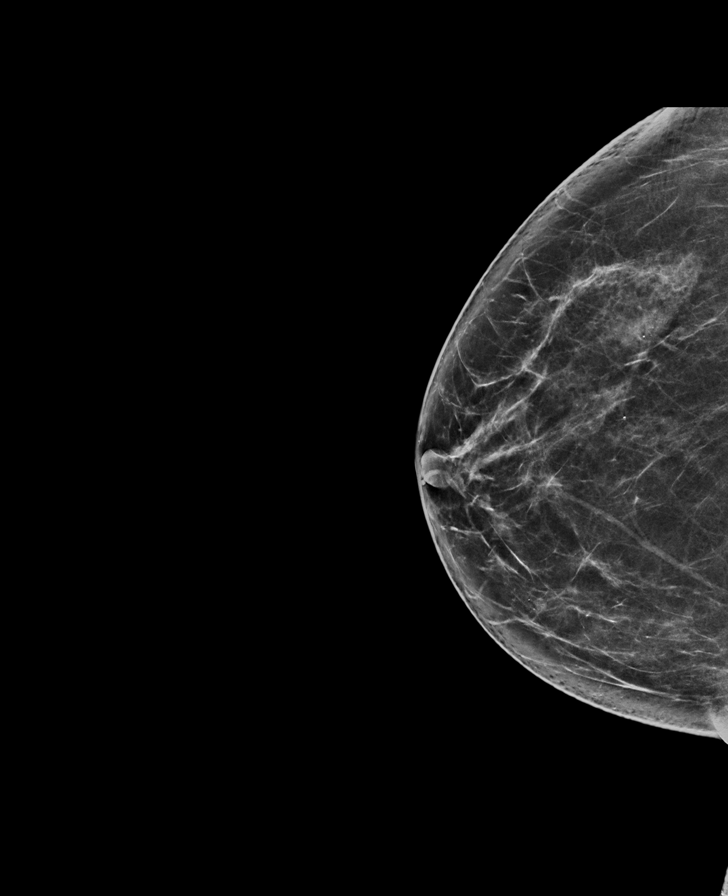

[L CC synth-2D]
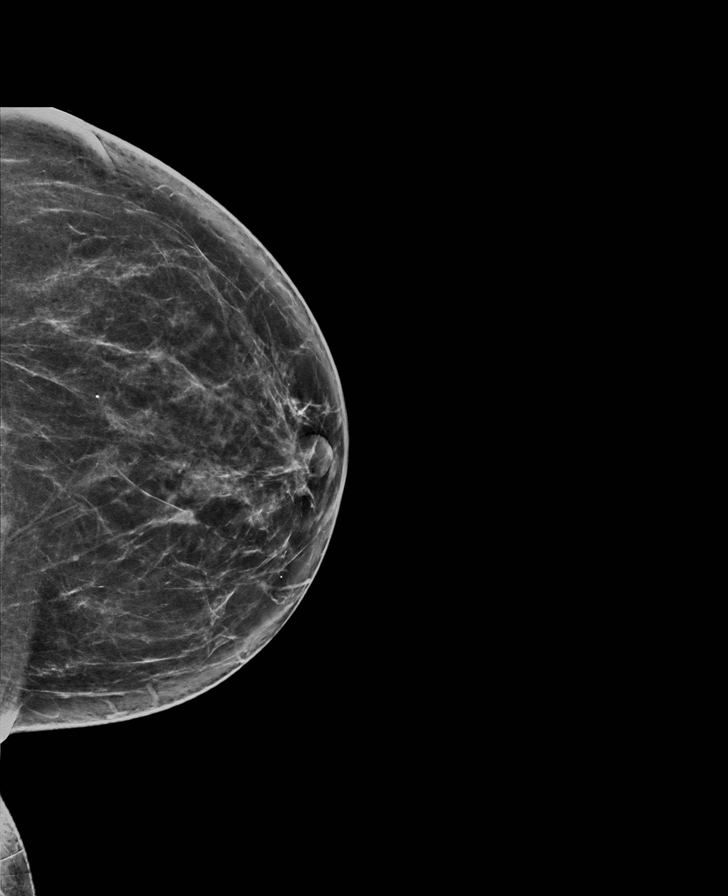

[L MLO synth-2D]
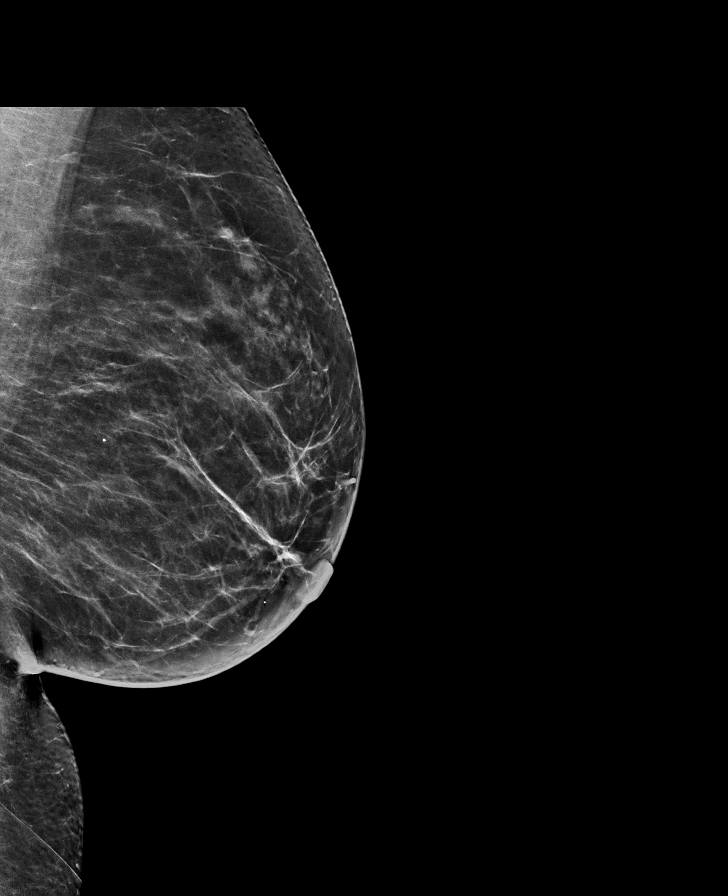

[R MLO synth-2D]
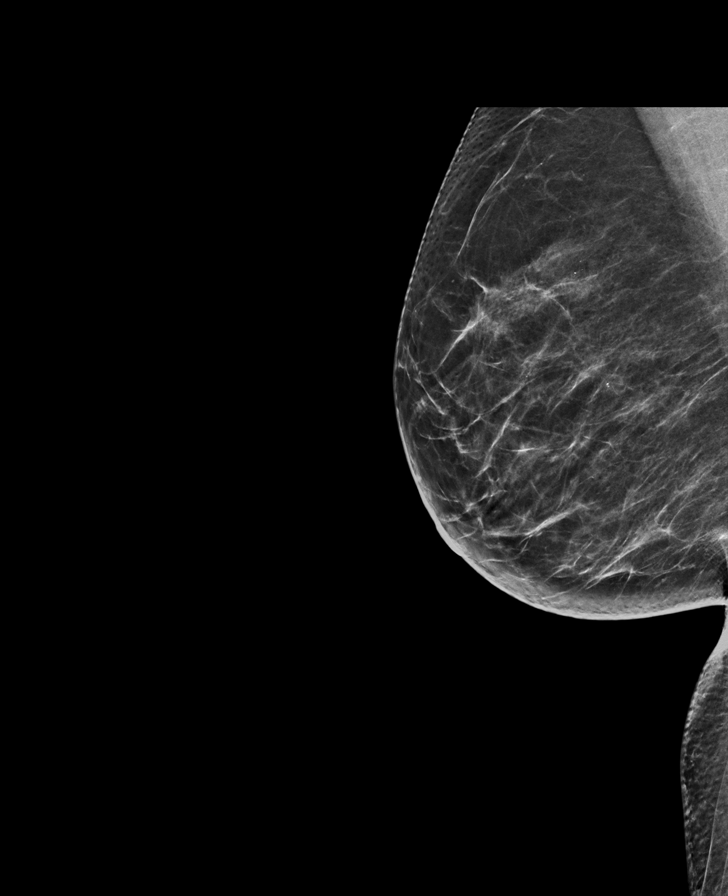

[R CC tomo · tomo slice 39/77.0]
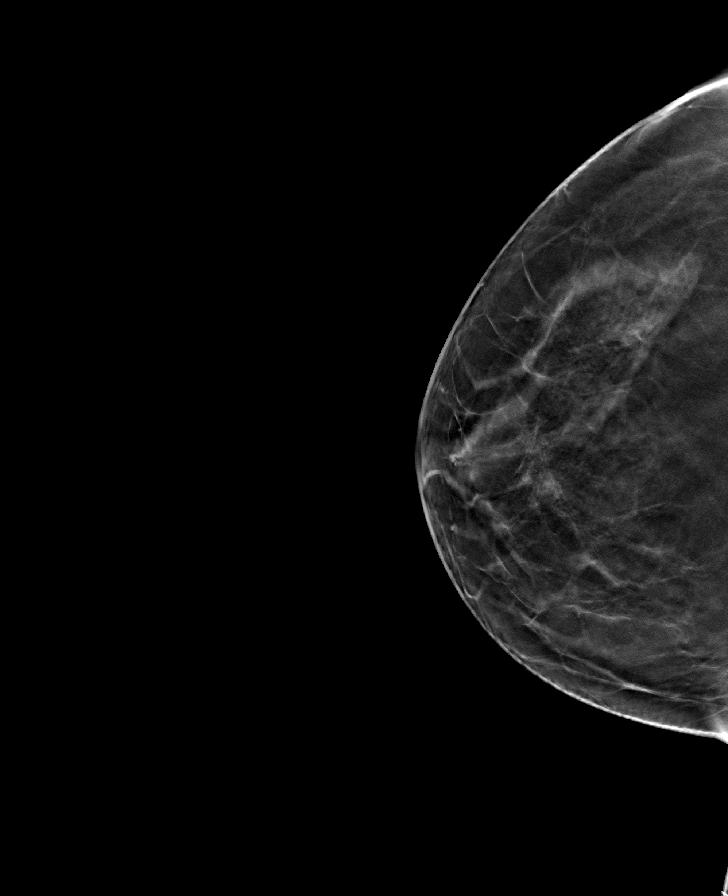

[L MLO tomo · tomo slice 37/74.0]
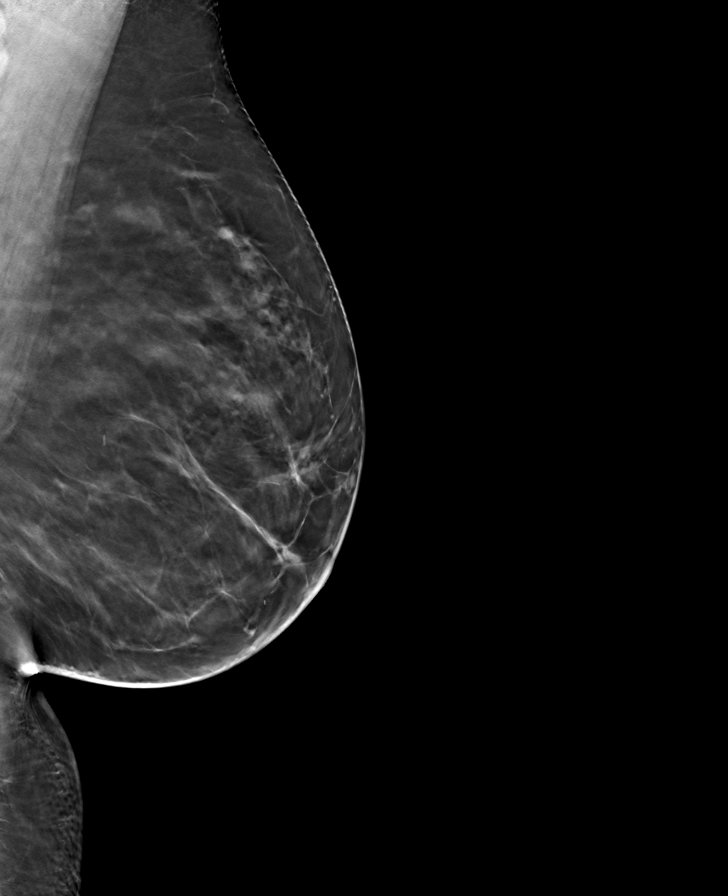

[R MLO tomo · tomo slice 37/73.0]
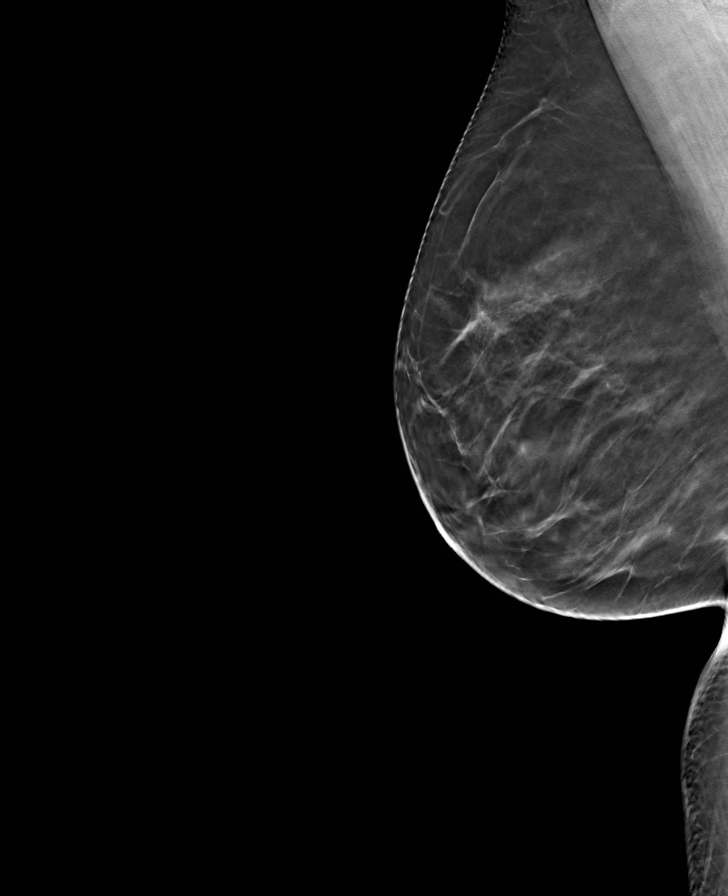

[L CC tomo · tomo slice 39/78.0]
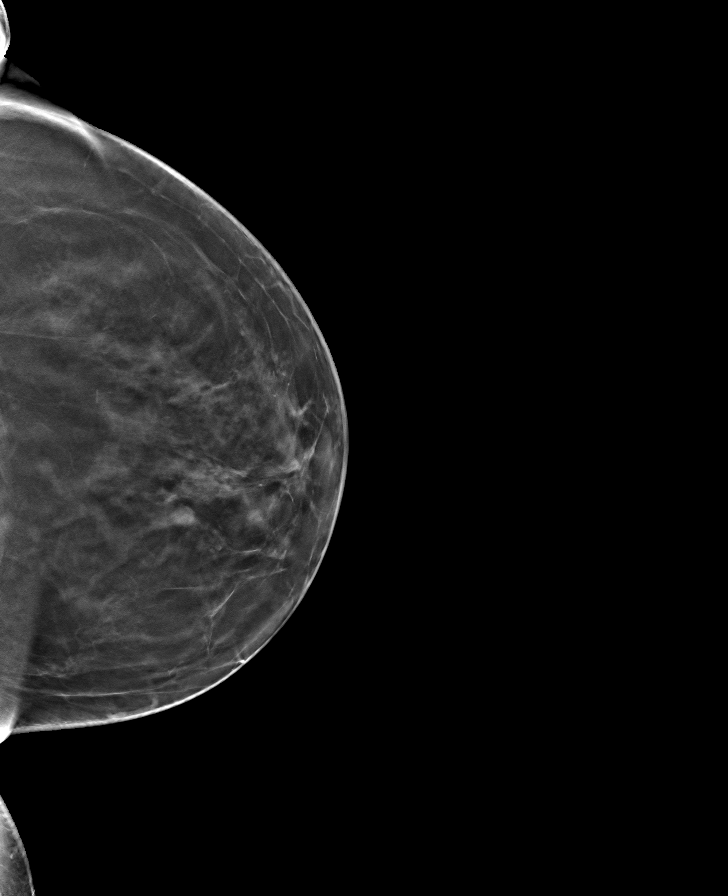

[8 of 24 positions shown; findings below may reference images not displayed]

ACR Breast Density Category b: There are scattered areas of
fibroglandular density.
FINDINGS: There are no findings suspicious for malignancy. Images were
processed with CAD.
IMPRESSION: No mammographic evidence of malignancy. A result letter of this
screening mammogram will be mailed directly to the patient.

RECOMMENDATION:
Screening mammogram in one year. (Code:[id number])

BI-RADS CATEGORY  1: Negative.

## 2019-09-06 NOTE — Progress Notes (Signed)
PCP - Verline Lema PA-C Cardiologist - [place] , [place] 03-16-19  epic  Chest x-ray - ct chest 01-27-19 epic EKG - 03-16-19 epic Stress Test - 03-06-19 epic ECHO -  Cardiac Cath - 03-14-19 epic  Sleep Study -  CPAP -   Fasting Blood Sugar -  Checks Blood Sugar _____ times a day  Blood Thinner Instructions: Aspirin Instructions: Last Dose:  Anesthesia review: osa mild no cpap PFO closure 2006, tia no deficits  Has a spinal cord stimulator  Patient denies shortness of breath, fever, cough and chest pain at PAT appointment   NONE   Patient verbalized understanding of instructions that were given to them at the PAT appointment. Patient was also instructed that they will need to review over the PAT instructions again at home before surgery.

## 2019-09-06 NOTE — Patient Instructions (Addendum)
DUE TO COVID-19 ONLY ONE VISITOR IS ALLOWED TO COME WITH YOU AND STAY IN THE WAITING ROOM ONLY DURING PRE OP AND PROCEDURE DAY OF SURGERY. THE 1 VISITOR MAY VISIT WITH YOU AFTER SURGERY IN YOUR PRIVATE ROOM DURING VISITING HOURS ONLY!  10am-8pm   YOU NEED TO HAVE A COVID 19 TEST ON__3-29-20_____ @_330  pm______, THIS TEST MUST BE DONE BEFORE SURGERY, COME to  [facility] TEST IS COMPLETED, PLEASE BEGIN THE QUARANTINE INSTRUCTIONS AS OUTLINED IN YOUR HANDOUT.                Jessica Shaw  09/06/2019   Your procedure is scheduled on: 09-14-19   Report to [facility] Main  Entrance   Report to admitting at     1030 AM     Call this number if you have problems the morning of surgery [phone]    Remember: NO SOLID FOOD AFTER MIDNIGHT THE NIGHT PRIOR TO SURGERY. NOTHING BY MOUTH EXCEPT CLEAR LIQUIDS UNTIL   0930 am. PLEASE FINISH ENSURE DRINK PER SURGEON ORDER  WHICH NEEDS TO BE COMPLETED AT   0930 am then nothing by mouth .     CLEAR LIQUID DIET   Foods Allowed                                                                                   Foods Excluded  Coffee and tea, regular and decaf   No creamer                                           liquids that you cannot  Plain Jell-O any favor except red or purple                                           see through such as: Fruit ices (not with fruit pulp)                                                                     milk, soups, orange juice  Iced Popsicles                                                                        All solid food Carbonated beverages, regular and diet  Cranberry, grape and apple juices Sports drinks like Gatorade Lightly seasoned clear broth or consume(fat free) Sugar, honey syrup  _____________________________________________________________________   BRUSH YOUR TEETH MORNING OF SURGERY AND RINSE YOUR MOUTH OUT, NO CHEWING GUM CANDY  OR MINTS.     Take these medicines the morning of surgery with A SIP OF WATER: omeprazole, metoprolol, amlodipine                                 You may not have any metal on your body including hair pins and              piercings  Do not wear jewelry, make-up, lotions, powders or perfumes, deodorant             Do not wear nail polish on your fingernails.  Do not shave  48 hours prior to surgery.     Do not bring valuables to the hospital. [place].  Contacts, dentures or bridgework may not be worn into surgery.  Leave suitcase in the car. After surgery it may be brought to your room.     Patients discharged the day of surgery will not be allowed to drive home. IF YOU ARE HAVING SURGERY AND GOING HOME THE SAME DAY, YOU MUST HAVE AN ADULT TO DRIVE YOU HOME AND BE WITH YOU FOR 24 HOURS. YOU MAY GO HOME BY TAXI OR UBER OR ORTHERWISE, BUT AN ADULT MUST ACCOMPANY YOU HOME AND STAY WITH YOU FOR 24 HOURS.  Name and phone number of your driver:  Special Instructions: N/A              Please read over the following fact sheets you were given: _____________________________________________________________________           [facility] - Preparing for Surgery Before surgery, you can play an important role.  Because skin is not sterile, your skin needs to be as free of germs as possible.  You can reduce the number of germs on your skin by washing with CHG (chlorahexidine gluconate) soap before surgery.  CHG is an antiseptic cleaner which kills germs and bonds with the skin to continue killing germs even after washing. Please DO NOT use if you have an allergy to CHG or antibacterial soaps.  If your skin becomes reddened/irritated stop using the CHG and inform your nurse when you arrive at Short Stay. Do not shave (including legs and underarms) for at least 48 hours prior to the first CHG shower.  You may shave your face/neck. Please follow these  instructions carefully:  1.  Shower with CHG Soap the night before surgery and the  morning of Surgery.  2.  If you choose to wash your hair, wash your hair first as usual with your  normal  shampoo.  3.  After you shampoo, rinse your hair and body thoroughly to remove the  shampoo.                           4.  Use CHG as you would any other liquid soap.  You can apply chg directly  to the skin and wash                       Gently with a scrungie or  clean washcloth.  5.  Apply the CHG Soap to your body ONLY FROM THE NECK DOWN.   Do not use on face/ open                           Wound or open sores. Avoid contact with eyes, ears mouth and genitals (private parts).                       Wash face,  Genitals (private parts) with your normal soap.             6.  Wash thoroughly, paying special attention to the area where your surgery  will be performed.  7.  Thoroughly rinse your body with warm water from the neck down.  8.  DO NOT shower/wash with your normal soap after using and rinsing off  the CHG Soap.                9.  Pat yourself dry with a clean towel.            10.  Wear clean pajamas.            11.  Place clean sheets on your bed the night of your first shower and do not  sleep with pets. Day of Surgery : Do not apply any lotions/deodorants the morning of surgery.  Please wear clean clothes to the hospital/surgery center.  FAILURE TO FOLLOW THESE INSTRUCTIONS MAY RESULT IN THE CANCELLATION OF YOUR SURGERY PATIENT SIGNATURE_________________________________  NURSE SIGNATURE__________________________________  ________________________________________________________________________   Jessica Shaw  An incentive spirometer is a tool that can help keep your lungs clear and active. This tool measures how well you are filling your lungs with each breath. Taking long deep breaths may help reverse or decrease the chance of developing breathing (pulmonary) problems (especially  infection) following:  A long period of time when you are unable to move or be active. BEFORE THE PROCEDURE   If the spirometer includes an indicator to show your best effort, your nurse or respiratory therapist will set it to a desired goal.  If possible, sit up straight or lean slightly forward. Try not to slouch.  Hold the incentive spirometer in an upright position. INSTRUCTIONS FOR USE  1. Sit on the edge of your bed if possible, or sit up as far as you can in bed or on a chair. 2. Hold the incentive spirometer in an upright position. 3. Breathe out normally. 4. Place the mouthpiece in your mouth and seal your lips tightly around it. 5. Breathe in slowly and as deeply as possible, raising the piston or the ball toward the top of the column. 6. Hold your breath for 3-5 seconds or for as long as possible. Allow the piston or ball to fall to the bottom of the column. 7. Remove the mouthpiece from your mouth and breathe out normally. 8. Rest for a few seconds and repeat Steps 1 through 7 at least 10 times every 1-2 hours when you are awake. Take your time and take a few normal breaths between deep breaths. 9. The spirometer may include an indicator to show your best effort. Use the indicator as a goal to work toward during each repetition. 10. After each set of 10 deep breaths, practice coughing to be sure your lungs are clear. If you have an incision (the cut made at the time of surgery), support your incision when coughing  by placing a pillow or rolled up towels firmly against it. Once you are able to get out of bed, walk around indoors and cough well. You may stop using the incentive spirometer when instructed by your caregiver.  RISKS AND COMPLICATIONS  Take your time so you do not get dizzy or light-headed.  If you are in pain, you may need to take or ask for pain medication before doing incentive spirometry. It is harder to take a deep breath if you are having pain. AFTER  USE  Rest and breathe slowly and easily.  It can be helpful to keep track of a log of your progress. Your caregiver can provide you with a simple table to help with this. If you are using the spirometer at home, follow these instructions: [place] IF:   You are having difficultly using the spirometer.  You have trouble using the spirometer as often as instructed.  Your pain medication is not giving enough relief while using the spirometer.  You develop fever of 100.5 F (38.1 C) or higher. SEEK IMMEDIATE MEDICAL CARE IF:   You cough up bloody sputum that had not been present before.  You develop fever of 102 F (38.9 C) or greater.  You develop worsening pain at or near the incision site. MAKE SURE YOU:   Understand these instructions.  Will watch your condition.  Will get help right away if you are not doing well or get worse. Document Released: 10/12/2006 Document Revised: 08/24/2011 Document Reviewed: 12/13/2006 [facility] Patient Information 2014 [place], [place].   ________________________________________________________________________

## 2019-09-08 ENCOUNTER — Encounter (HOSPITAL_COMMUNITY)
Admission: RE | Admit: 2019-09-08 | Discharge: 2019-09-08 | Disposition: A | Discharge status: Home / Self Care | Payer: BLUE CROSS/BLUE SHIELD | Source: Ambulatory Visit | Attending: Orthopedic Surgery | Admitting: Orthopedic Surgery

## 2019-09-08 ENCOUNTER — Other Ambulatory Visit: Payer: Self-pay

## 2019-09-08 ENCOUNTER — Encounter (HOSPITAL_COMMUNITY): Payer: Self-pay

## 2019-09-08 DIAGNOSIS — G4733 Obstructive sleep apnea (adult) (pediatric): Secondary | ICD-10-CM | POA: Insufficient documentation

## 2019-09-08 DIAGNOSIS — I2510 Atherosclerotic heart disease of native coronary artery without angina pectoris: Secondary | ICD-10-CM | POA: Diagnosis not present

## 2019-09-08 DIAGNOSIS — E785 Hyperlipidemia, unspecified: Secondary | ICD-10-CM | POA: Insufficient documentation

## 2019-09-08 DIAGNOSIS — F17210 Nicotine dependence, cigarettes, uncomplicated: Secondary | ICD-10-CM | POA: Insufficient documentation

## 2019-09-08 DIAGNOSIS — Z01818 Encounter for other preprocedural examination: Secondary | ICD-10-CM | POA: Insufficient documentation

## 2019-09-08 DIAGNOSIS — Z7982 Long term (current) use of aspirin: Secondary | ICD-10-CM | POA: Insufficient documentation

## 2019-09-08 DIAGNOSIS — Z79899 Other long term (current) drug therapy: Secondary | ICD-10-CM | POA: Diagnosis not present

## 2019-09-08 DIAGNOSIS — Z86718 Personal history of other venous thrombosis and embolism: Secondary | ICD-10-CM | POA: Insufficient documentation

## 2019-09-08 DIAGNOSIS — Z8673 Personal history of transient ischemic attack (TIA), and cerebral infarction without residual deficits: Secondary | ICD-10-CM | POA: Diagnosis not present

## 2019-09-08 DIAGNOSIS — K219 Gastro-esophageal reflux disease without esophagitis: Secondary | ICD-10-CM | POA: Diagnosis not present

## 2019-09-08 DIAGNOSIS — M7501 Adhesive capsulitis of right shoulder: Secondary | ICD-10-CM | POA: Insufficient documentation

## 2019-09-08 DIAGNOSIS — Z8774 Personal history of (corrected) congenital malformations of heart and circulatory system: Secondary | ICD-10-CM | POA: Insufficient documentation

## 2019-09-08 LAB — BASIC METABOLIC PANEL
Anion gap: 6 (ref 5–15)
BUN: 19 mg/dL (ref 6–20)
CO2: 26 mmol/L (ref 22–32)
Calcium: 9.4 mg/dL (ref 8.9–10.3)
Chloride: 107 mmol/L (ref 98–111)
Creatinine, Ser: 0.55 mg/dL (ref 0.44–1.00)
GFR calc Af Amer: >60 mL/min (ref >60)
GFR calc non Af Amer: >60 mL/min (ref >60)
Glucose, Bld: 95 mg/dL (ref 70–99)
Potassium: 4.1 mmol/L (ref 3.5–5.1)
Sodium: 139 mmol/L (ref 135–145)

## 2019-09-08 LAB — CBC
HCT: 43.4 % (ref 36.0–46.0)
Hemoglobin: 14.1 g/dL (ref 12.0–15.0)
MCH: 30.3 pg (ref 26.0–34.0)
MCHC: 32.5 g/dL (ref 30.0–36.0)
MCV: 93.3 fL (ref 80.0–100.0)
Platelets: 295 10*3/uL (ref 150–400)
RBC: 4.65 MIL/uL (ref 3.87–5.11)
RDW: 12.8 % (ref 11.5–15.5)
WBC: 10.8 10*3/uL — ABNORMAL HIGH (ref 4.0–10.5)
nRBC: 0 % (ref 0.0–0.2)

## 2019-09-11 ENCOUNTER — Other Ambulatory Visit: Payer: Self-pay

## 2019-09-11 ENCOUNTER — Other Ambulatory Visit (HOSPITAL_COMMUNITY)
Admission: RE | Admit: 2019-09-11 | Discharge: 2019-09-11 | Disposition: A | Discharge status: Home / Self Care | Payer: BLUE CROSS/BLUE SHIELD | Source: Ambulatory Visit | Attending: Orthopedic Surgery | Admitting: Orthopedic Surgery

## 2019-09-11 DIAGNOSIS — Z01812 Encounter for preprocedural laboratory examination: Secondary | ICD-10-CM | POA: Insufficient documentation

## 2019-09-11 DIAGNOSIS — Z20822 Contact with and (suspected) exposure to covid-19: Secondary | ICD-10-CM | POA: Insufficient documentation

## 2019-09-11 LAB — SARS CORONAVIRUS 2 (TAT 6-24 HRS): SARS Coronavirus 2: NEGATIVE

## 2019-09-11 NOTE — Progress Notes (Addendum)
Anesthesia Chart Review   Case: [id number] Date/Time: 09/14/19 1215   Procedure: Right shoulder arthroscopy, manipulation under anesthesia, lysis of adhesions, subacromial decompression, distal clavicle resection (Right ) - 29min   Anesthesia type: General   Pre-op diagnosis: Right shoulder adhesive capsulitis   Location: [facility] ROOM 06 / [facility]   Surgeons: Justice Britain, MD      DISCUSSION:54 y.o. current every day smoker (18 pack years) with h/o PONV, GERD, HLD, Stroke, moderate nonobstructive CAD, mild OSA, spinal cord stimulator (advised to bring remote), PFO closure 2006, right shoulder adhesive capsulitis scheduled for above procedure 09/14/2019 with Dr. Justice Britain.   Pt evaluated by cardiologist, Dr. Vernell Leep, due to exertional dyspnea.  High risk stress test.  Subsequent cardiac cath 03/14/2019 with moderate nonobstructive CAD.  Last seen 03/15/2018.  Per OV note, "She still has episodes of chest and neck pressure, that sometimes occur with rest, and at times with exertion. However, most days she is able to walk for 2 miles without significant difficulty."  Amlodipine and prn nitroglycerin prescribed at this visit.   Pt reports asymptomatic during PAT nurse visit.   Discussed with Dr. Ambrose Pancoast.  Anticipate pt can proceed with planned procedure barring acute status change.   VS: BP 118/85   Pulse 75   Temp 36.9 C (Oral)   Resp 16   Ht 5\' 2"  (1.575 m)   Wt 68.9 kg   SpO2 98%   BMI 27.80 kg/m   PROVIDERS: Delsa Grana, PA-C is PCP   Vernell Leep, MD is Cardiologist  LABS: Labs reviewed: Acceptable for surgery. (all labs ordered are listed, but only abnormal results are displayed)  Labs Reviewed - No data to display   IMAGES:   EKG: 03/16/2019 Rate 74 bpm Normal EKG   CV: Cardiac Cath [id number]  LV end diastolic pressure is normal.   LM: Normal LAD: Mid LAD 50% and ostial Diag 40% stenosis at LAD/Diag bifurcation. LAD tapers rapidly in size after the  bifurcation. dFR LAD: 0.90 Diag 0.97 LCx: Normal RCA: Distal RCA 20% disease.  LVEDP normal.  Moderate nonobstructive epicardial CAD. Possible microvascular disease, as evidenced by slow TIMI 2 flow in all vessels. Recommend medical management.   Echo 12/09/2004 SUMMARY  - The left ventricle was normal.  - The septal occluder is well visualized. There are 2 small mobile     thrombi noted on tee left atrial side. The left atrial     appendage function was normal (normal emptying velocity).     There was no left atrial appendage thrombus identified.  - The septal occluder device is located away from the pulmonary     vein. The pulmonary veins were normal.   Past Medical History:  Diagnosis Date  . Complication of anesthesia   . Coronary artery disease    PFO closure 2006  . Embolism - blood clot    arm   left   . GERD (gastroesophageal reflux disease)   . HLD (hyperlipidemia)   . Migraine   . PONV (postoperative nausea and vomiting)   . Sleep apnea    mild OSA, no CPAP  . Smoker   . Spinal cord stimulator status 2006   in lower back, pt patient  . Stroke [facility])    TIA, no deficits    Past Surgical History:  Procedure Laterality Date  . CARDIAC CATHETERIZATION    . CHOLECYSTECTOMY    . COLONOSCOPY  08/04/2005   [id number] external hemorrhoids and anal papilla, otherwise normal  .  COLONOSCOPY N/A 10/07/2012   Procedure: COLONOSCOPY;  Surgeon: Daneil Dolin, MD;  [facility];  Service: Endoscopy;  Laterality: N/A;  10:30  . COLONOSCOPY N/A 10/13/2016   Procedure: COLONOSCOPY;  Surgeon: Daneil Dolin, MD;  [facility];  Service: Endoscopy;  Laterality: N/A;  9:00am  . ESOPHAGOGASTRODUODENOSCOPY  04/20/2003   [id number] changes of reflux esophagitis/Limited gastroesophageal junction, otherwise normal  . ESOPHAGOGASTRODUODENOSCOPY N/A 10/07/2012   Procedure: ESOPHAGOGASTRODUODENOSCOPY (EGD);  Surgeon: Daneil Dolin, MD;   [facility];  Service: Endoscopy;  Laterality: N/A;  . INTRAVASCULAR PRESSURE WIRE/FFR STUDY N/A 03/14/2019   Procedure: INTRAVASCULAR PRESSURE WIRE/FFR STUDY;  Surgeon: Nigel Mormon, MD;  Location: [place] CV LAB;  Service: Cardiovascular;  Laterality: N/A;  . LEFT HEART CATH AND CORONARY ANGIOGRAPHY N/A 03/14/2019   Procedure: LEFT HEART CATH AND CORONARY ANGIOGRAPHY;  Surgeon: Nigel Mormon, MD;  Location: [place] CV LAB;  Service: Cardiovascular;  Laterality: N/A;  . PATENT FORAMEN OVALE CLOSURE    . POLYPECTOMY  10/13/2016   Procedure: POLYPECTOMY;  Surgeon: Daneil Dolin, MD;  [facility];  Service: Endoscopy;;  colon  . SHOULDER ARTHROSCOPY     left  . SPINAL CORD STIMULATOR IMPLANT    . TUBAL LIGATION    . WRIST ARTHROSCOPY WITH DEBRIDEMENT Right 06/23/2018   Procedure: RIGHT ARTHROSCOPY WRIST DEBRIDEMENT LUNO TRIQUETRAL TEAR SHRINKAGE;  Surgeon: Daryll Brod, MD;  Location: [place];  Service: Orthopedics;  Laterality: Right;  AXILLARY BLAOCK    MEDICATIONS: . amLODipine (NORVASC) 2.5 MG tablet  . aspirin EC 81 MG tablet  . atorvastatin (LIPITOR) 80 MG tablet  . eletriptan (RELPAX) 40 MG tablet  . Ibuprofen (ADVIL MIGRAINE) 200 MG CAPS  . metoprolol succinate (TOPROL-XL) 50 MG 24 hr tablet  . nitroGLYCERIN (NITROSTAT) 0.4 MG SL tablet  . omeprazole (PRILOSEC) 20 MG capsule   No current facility-administered medications for this encounter.     Maia Plan [facility] Pre-Surgical Testing [phone] 09/13/19  4:19 PM

## 2019-09-12 ENCOUNTER — Other Ambulatory Visit: Payer: Self-pay | Admitting: Cardiology

## 2019-09-12 DIAGNOSIS — I25118 Atherosclerotic heart disease of native coronary artery with other forms of angina pectoris: Secondary | ICD-10-CM

## 2019-09-13 NOTE — Anesthesia Preprocedure Evaluation (Addendum)
Anesthesia Evaluation  Patient identified by MRN, date of birth, ID band Patient awake    Reviewed: Allergy & Precautions, H&P , NPO status , Patient's Chart, lab work & pertinent test results  History of Anesthesia Complications (+) PONV and history of anesthetic complications  Airway Mallampati: II   Neck ROM: full    Dental   Pulmonary sleep apnea , Current Smoker and Patient abstained from smoking.,    breath sounds clear to auscultation       Cardiovascular + CAD   Rhythm:regular Rate:Normal     Neuro/Psych  Headaches, Spinal cord stimulator  Neuromuscular disease    GI/Hepatic GERD  ,  Endo/Other    Renal/GU      Musculoskeletal   Abdominal   Peds  Hematology   Anesthesia Other Findings   Reproductive/Obstetrics                            Anesthesia Physical Anesthesia Plan  ASA: II  Anesthesia Plan: General   Post-op Pain Management:  Regional for Post-op pain   Induction: Intravenous  PONV Risk Score and Plan: 3 and Ondansetron, Dexamethasone, Midazolam and Treatment may vary due to age or medical condition  Airway Management Planned: Oral ETT  Additional Equipment:   Intra-op Plan:   Post-operative Plan: Extubation in OR  Informed Consent: I have reviewed the patients History and Physical, chart, labs and discussed the procedure including the risks, benefits and alternatives for the proposed anesthesia with the patient or authorized representative who has indicated his/her understanding and acceptance.       Plan Discussed with: CRNA, Surgeon and Anesthesiologist  Anesthesia Plan Comments: (See PAT note 09/08/2019, Konrad Felix, PA-C)       Anesthesia Quick Evaluation

## 2019-09-14 ENCOUNTER — Ambulatory Visit (HOSPITAL_COMMUNITY): Payer: BLUE CROSS/BLUE SHIELD | Admitting: Physician Assistant

## 2019-09-14 ENCOUNTER — Encounter (HOSPITAL_COMMUNITY): Payer: Self-pay | Admitting: Orthopedic Surgery

## 2019-09-14 ENCOUNTER — Encounter (HOSPITAL_COMMUNITY)
Admission: RE | Disposition: A | Discharge status: Home / Self Care | Payer: Self-pay | Source: Ambulatory Visit | Attending: Orthopedic Surgery

## 2019-09-14 ENCOUNTER — Ambulatory Visit (HOSPITAL_COMMUNITY): Payer: BLUE CROSS/BLUE SHIELD | Admitting: Anesthesiology

## 2019-09-14 ENCOUNTER — Ambulatory Visit (HOSPITAL_COMMUNITY)
Admission: RE | Admit: 2019-09-14 | Discharge: 2019-09-14 | Disposition: A | Discharge status: Home / Self Care | Payer: BLUE CROSS/BLUE SHIELD | Source: Ambulatory Visit | Attending: Orthopedic Surgery | Admitting: Orthopedic Surgery

## 2019-09-14 DIAGNOSIS — Z86718 Personal history of other venous thrombosis and embolism: Secondary | ICD-10-CM | POA: Insufficient documentation

## 2019-09-14 DIAGNOSIS — Z8774 Personal history of (corrected) congenital malformations of heart and circulatory system: Secondary | ICD-10-CM | POA: Diagnosis not present

## 2019-09-14 DIAGNOSIS — M19011 Primary osteoarthritis, right shoulder: Secondary | ICD-10-CM | POA: Insufficient documentation

## 2019-09-14 DIAGNOSIS — M7541 Impingement syndrome of right shoulder: Secondary | ICD-10-CM | POA: Diagnosis not present

## 2019-09-14 DIAGNOSIS — Z9682 Presence of neurostimulator: Secondary | ICD-10-CM | POA: Insufficient documentation

## 2019-09-14 DIAGNOSIS — Z8249 Family history of ischemic heart disease and other diseases of the circulatory system: Secondary | ICD-10-CM | POA: Diagnosis not present

## 2019-09-14 DIAGNOSIS — E785 Hyperlipidemia, unspecified: Secondary | ICD-10-CM | POA: Insufficient documentation

## 2019-09-14 DIAGNOSIS — G4733 Obstructive sleep apnea (adult) (pediatric): Secondary | ICD-10-CM | POA: Insufficient documentation

## 2019-09-14 DIAGNOSIS — Z7982 Long term (current) use of aspirin: Secondary | ICD-10-CM | POA: Diagnosis not present

## 2019-09-14 DIAGNOSIS — I2510 Atherosclerotic heart disease of native coronary artery without angina pectoris: Secondary | ICD-10-CM | POA: Diagnosis not present

## 2019-09-14 DIAGNOSIS — M65811 Other synovitis and tenosynovitis, right shoulder: Secondary | ICD-10-CM | POA: Diagnosis not present

## 2019-09-14 DIAGNOSIS — F17200 Nicotine dependence, unspecified, uncomplicated: Secondary | ICD-10-CM | POA: Insufficient documentation

## 2019-09-14 DIAGNOSIS — M7521 Bicipital tendinitis, right shoulder: Secondary | ICD-10-CM | POA: Diagnosis not present

## 2019-09-14 DIAGNOSIS — Z8673 Personal history of transient ischemic attack (TIA), and cerebral infarction without residual deficits: Secondary | ICD-10-CM | POA: Insufficient documentation

## 2019-09-14 DIAGNOSIS — K219 Gastro-esophageal reflux disease without esophagitis: Secondary | ICD-10-CM | POA: Insufficient documentation

## 2019-09-14 DIAGNOSIS — Z79899 Other long term (current) drug therapy: Secondary | ICD-10-CM | POA: Insufficient documentation

## 2019-09-14 DIAGNOSIS — M25511 Pain in right shoulder: Secondary | ICD-10-CM | POA: Diagnosis not present

## 2019-09-14 DIAGNOSIS — M7501 Adhesive capsulitis of right shoulder: Secondary | ICD-10-CM | POA: Insufficient documentation

## 2019-09-14 DIAGNOSIS — Z4889 Encounter for other specified surgical aftercare: Secondary | ICD-10-CM | POA: Diagnosis not present

## 2019-09-14 DIAGNOSIS — G8918 Other acute postprocedural pain: Secondary | ICD-10-CM | POA: Diagnosis not present

## 2019-09-14 DIAGNOSIS — R600 Localized edema: Secondary | ICD-10-CM | POA: Diagnosis not present

## 2019-09-14 SURGERY — SHOULDER ARTHROSCOPY WITH SUBACROMIAL DECOMPRESSION AND DISTAL CLAVICLE EXCISION
Anesthesia: General | Laterality: Right

## 2019-09-14 MED ORDER — DEXAMETHASONE SODIUM PHOSPHATE 10 MG/ML IJ SOLN
INTRAMUSCULAR | Status: DC | PRN
  Administered 2019-09-14: 10 mg via INTRAVENOUS

## 2019-09-14 MED ORDER — FENTANYL CITRATE (PF) 100 MCG/2ML IJ SOLN: 25.0000 ug | INTRAMUSCULAR | Status: DC | PRN

## 2019-09-14 MED ORDER — PROMETHAZINE HCL 25 MG/ML IJ SOLN
INTRAMUSCULAR | Status: AC
  Filled 2019-09-14: qty 1

## 2019-09-14 MED ORDER — FENTANYL CITRATE (PF) 100 MCG/2ML IJ SOLN
50.0000 ug | INTRAMUSCULAR | Status: DC
  Administered 2019-09-14: 50 ug via INTRAVENOUS
  Filled 2019-09-14: qty 2

## 2019-09-14 MED ORDER — PROPOFOL 10 MG/ML IV BOLUS
INTRAVENOUS | Status: AC
  Filled 2019-09-14: qty 20

## 2019-09-14 MED ORDER — ONDANSETRON HCL 4 MG/2ML IJ SOLN
4.0000 mg | Freq: Four times a day (QID) | INTRAMUSCULAR | Status: AC | PRN
  Administered 2019-09-14: 4 mg via INTRAVENOUS

## 2019-09-14 MED ORDER — SODIUM CHLORIDE 0.9 % IR SOLN
Status: DC | PRN
  Administered 2019-09-14: 6000 mL

## 2019-09-14 MED ORDER — LACTATED RINGERS IV SOLN: INTRAVENOUS | Status: DC

## 2019-09-14 MED ORDER — SUGAMMADEX SODIUM 200 MG/2ML IV SOLN
INTRAVENOUS | Status: DC | PRN
  Administered 2019-09-14: 137.8 mg via INTRAVENOUS

## 2019-09-14 MED ORDER — CHLORHEXIDINE GLUCONATE 4 % EX LIQD: 60.0000 mL | Freq: Once | CUTANEOUS | Status: DC

## 2019-09-14 MED ORDER — BUPIVACAINE LIPOSOME 1.3 % IJ SUSP
INTRAMUSCULAR | Status: DC | PRN
  Administered 2019-09-14: 10 mL via PERINEURAL

## 2019-09-14 MED ORDER — FENTANYL CITRATE (PF) 100 MCG/2ML IJ SOLN
INTRAMUSCULAR | Status: AC
  Filled 2019-09-14: qty 2

## 2019-09-14 MED ORDER — SCOPOLAMINE 1 MG/3DAYS TD PT72
1.0000 | MEDICATED_PATCH | TRANSDERMAL | Status: DC
  Administered 2019-09-14: 1.5 mg via TRANSDERMAL
  Filled 2019-09-14: qty 1

## 2019-09-14 MED ORDER — ONDANSETRON HCL 4 MG/2ML IJ SOLN
INTRAMUSCULAR | Status: AC
  Filled 2019-09-14: qty 2

## 2019-09-14 MED ORDER — PROMETHAZINE HCL 25 MG/ML IJ SOLN
6.2500 mg | INTRAMUSCULAR | Status: AC | PRN
  Administered 2019-09-14: 16:00:00 12.5 mg via INTRAVENOUS
  Administered 2019-09-14: 6.25 mg via INTRAVENOUS

## 2019-09-14 MED ORDER — MIDAZOLAM HCL 2 MG/2ML IJ SOLN
1.0000 mg | INTRAMUSCULAR | Status: DC
  Administered 2019-09-14: 13:00:00 1 mg via INTRAVENOUS
  Filled 2019-09-14: qty 2

## 2019-09-14 MED ORDER — CEFAZOLIN SODIUM-DEXTROSE 2-4 GM/100ML-% IV SOLN
2.0000 g | INTRAVENOUS | Status: AC
  Administered 2019-09-14: 2 g via INTRAVENOUS
  Filled 2019-09-14: qty 100

## 2019-09-14 MED ORDER — ROCURONIUM BROMIDE 10 MG/ML (PF) SYRINGE
PREFILLED_SYRINGE | INTRAVENOUS | Status: DC | PRN
  Administered 2019-09-14: 50 mg via INTRAVENOUS

## 2019-09-14 MED ORDER — PROPOFOL 10 MG/ML IV BOLUS
INTRAVENOUS | Status: DC | PRN
  Administered 2019-09-14: 120 mg via INTRAVENOUS

## 2019-09-14 MED ORDER — BUPIVACAINE HCL (PF) 0.5 % IJ SOLN
INTRAMUSCULAR | Status: DC | PRN
  Administered 2019-09-14: 15 mL via PERINEURAL

## 2019-09-14 SURGICAL SUPPLY — 55 items
BLADE EXCALIBUR 4.0X13 (MISCELLANEOUS) ×2 IMPLANT
BOOTIES KNEE HIGH SLOAN (MISCELLANEOUS) ×4 IMPLANT
BURR OVAL 8 FLU 4.0X13 (MISCELLANEOUS) ×2 IMPLANT
BURR OVAL 8 FLU 5.0X13 (MISCELLANEOUS) ×2 IMPLANT
CANNULA ACUFLEX KIT 5X76 (CANNULA) ×2 IMPLANT
CANNULA DRILOCK 5.0X75 (CANNULA) IMPLANT
CANNULA TWIST IN 8.25X7CM (CANNULA) IMPLANT
CONNECTOR 5 IN 1 STRAIGHT STRL (MISCELLANEOUS) ×2 IMPLANT
COOLER ICEMAN CLASSIC (MISCELLANEOUS) IMPLANT
COVER WAND RF STERILE (DRAPES) ×2 IMPLANT
DISSECTOR  3.8MM X 13CM (MISCELLANEOUS) ×2
DISSECTOR 3.8MM X 13CM (MISCELLANEOUS) ×1 IMPLANT
DRAPE INCISE 23X17 IOBAN STRL (DRAPES) ×1
DRAPE INCISE 23X17 STRL (DRAPES) ×1 IMPLANT
DRAPE INCISE IOBAN 23X17 STRL (DRAPES) ×1 IMPLANT
DRAPE INCISE IOBAN 66X45 STRL (DRAPES) ×2 IMPLANT
DRAPE ORTHO SPLIT 77X108 STRL (DRAPES) ×4
DRAPE STERI 35X30 U-POUCH (DRAPES) ×2 IMPLANT
DRAPE SURG 17X11 SM STRL (DRAPES) ×2 IMPLANT
DRAPE SURG ORHT 6 SPLT 77X108 (DRAPES) ×2 IMPLANT
DRAPE U-SHAPE 47X51 STRL (DRAPES) ×2 IMPLANT
DRSG PAD ABDOMINAL 8X10 ST (GAUZE/BANDAGES/DRESSINGS) ×3 IMPLANT
DURAPREP 26ML APPLICATOR (WOUND CARE) ×4 IMPLANT
GAUZE SPONGE 4X4 12PLY STRL (GAUZE/BANDAGES/DRESSINGS) ×2 IMPLANT
GLOVE BIO SURGEON STRL SZ7.5 (GLOVE) ×2 IMPLANT
GLOVE BIO SURGEON STRL SZ8 (GLOVE) ×2 IMPLANT
GLOVE SS BIOGEL STRL SZ 7 (GLOVE) ×2 IMPLANT
GLOVE SS BIOGEL STRL SZ 7.5 (GLOVE) ×1 IMPLANT
GLOVE SUPERSENSE BIOGEL SZ 7 (GLOVE) ×2
GLOVE SUPERSENSE BIOGEL SZ 7.5 (GLOVE) ×1
GOWN STRL REUS W/TWL LRG LVL3 (GOWN DISPOSABLE) ×4 IMPLANT
KIT BASIN OR (CUSTOM PROCEDURE TRAY) ×2 IMPLANT
KIT SHOULDER TRACTION (DRAPES) ×2 IMPLANT
KIT TURNOVER KIT A (KITS) IMPLANT
MANIFOLD NEPTUNE II (INSTRUMENTS) ×2 IMPLANT
NDL SCORPION MULTI FIRE (NEEDLE) IMPLANT
NEEDLE SCORPION MULTI FIRE (NEEDLE) IMPLANT
NS IRRIG 1000ML POUR BTL (IV SOLUTION) ×2 IMPLANT
PACK ARTHROSCOPY DSU (CUSTOM PROCEDURE TRAY) ×2 IMPLANT
PAD ARMBOARD 7.5X6 YLW CONV (MISCELLANEOUS) ×2 IMPLANT
PAD COLD SHLDR WRAP-ON (PAD) IMPLANT
PROBE APOLLO 90XL (SURGICAL WAND) ×2 IMPLANT
SLING ARM FOAM STRAP MED (SOFTGOODS) IMPLANT
SPONGE LAP 4X18 RFD (DISPOSABLE) ×2 IMPLANT
STRIP CLOSURE SKIN 1/2X4 (GAUZE/BANDAGES/DRESSINGS) ×2 IMPLANT
SUT FIBERWIRE #2 38 T-5 BLUE (SUTURE)
SUT MNCRL AB 3-0 PS2 18 (SUTURE) ×2 IMPLANT
SUT PDS AB 0 CT 36 (SUTURE) IMPLANT
SUT TIGER TAPE 7 IN WHITE (SUTURE) ×2 IMPLANT
SUTURE FIBERWR #2 38 T-5 BLUE (SUTURE) IMPLANT
TAPE FIBER 2MM 7IN #2 BLUE (SUTURE) IMPLANT
TAPE PAPER 3X10 WHT MICROPORE (GAUZE/BANDAGES/DRESSINGS) ×2 IMPLANT
TOWEL OR 17X26 10 PK STRL BLUE (TOWEL DISPOSABLE) ×2 IMPLANT
TUBING ARTHROSCOPY IRRIG 16FT (MISCELLANEOUS) ×2 IMPLANT
WATER STERILE IRR 1000ML POUR (IV SOLUTION) ×2 IMPLANT

## 2019-09-14 NOTE — H&P (Signed)
Jessica Shaw    Chief Complaint: Right shoulder adhesive capsulitis HPI: The patient is a 54 y.o. female with chronic right shoulder pain and profoundly restricted mobility and clinical exam consistent with adhesive capsulitis.  Due to her ongoing pain and functional rotations and failure to respond to prolonged attempts at conservative management she is brought to the operating this time for planned right shoulder manipulation under anesthesia with anticipated arthroscopic lysis of adhesions.  Past Medical History:  Diagnosis Date  . Complication of anesthesia   . Coronary artery disease    PFO closure 2006  . Embolism - blood clot    arm   left   . GERD (gastroesophageal reflux disease)   . HLD (hyperlipidemia)   . Migraine   . PONV (postoperative nausea and vomiting)   . Sleep apnea    mild OSA, no CPAP  . Smoker   . Spinal cord stimulator status 2006   in lower back, pt patient  . Stroke [facility])    TIA, no deficits    Past Surgical History:  Procedure Laterality Date  . CARDIAC CATHETERIZATION    . CHOLECYSTECTOMY    . COLONOSCOPY  08/04/2005   [id number] external hemorrhoids and anal papilla, otherwise normal  . COLONOSCOPY N/A 10/07/2012   Procedure: COLONOSCOPY;  Surgeon: Daneil Dolin, MD;  [facility];  Service: Endoscopy;  Laterality: N/A;  10:30  . COLONOSCOPY N/A 10/13/2016   Procedure: COLONOSCOPY;  Surgeon: Daneil Dolin, MD;  [facility];  Service: Endoscopy;  Laterality: N/A;  9:00am  . ESOPHAGOGASTRODUODENOSCOPY  04/20/2003   [id number] changes of reflux esophagitis/Limited gastroesophageal junction, otherwise normal  . ESOPHAGOGASTRODUODENOSCOPY N/A 10/07/2012   Procedure: ESOPHAGOGASTRODUODENOSCOPY (EGD);  Surgeon: Daneil Dolin, MD;  [facility];  Service: Endoscopy;  Laterality: N/A;  . INTRAVASCULAR PRESSURE WIRE/FFR STUDY N/A 03/14/2019   Procedure: INTRAVASCULAR PRESSURE WIRE/FFR STUDY;  Surgeon: Nigel Mormon,  MD;  Location: [place] CV LAB;  Service: Cardiovascular;  Laterality: N/A;  . LEFT HEART CATH AND CORONARY ANGIOGRAPHY N/A 03/14/2019   Procedure: LEFT HEART CATH AND CORONARY ANGIOGRAPHY;  Surgeon: Nigel Mormon, MD;  Location: [place] CV LAB;  Service: Cardiovascular;  Laterality: N/A;  . PATENT FORAMEN OVALE CLOSURE    . POLYPECTOMY  10/13/2016   Procedure: POLYPECTOMY;  Surgeon: Daneil Dolin, MD;  [facility];  Service: Endoscopy;;  colon  . SHOULDER ARTHROSCOPY     left  . SPINAL CORD STIMULATOR IMPLANT    . TUBAL LIGATION    . WRIST ARTHROSCOPY WITH DEBRIDEMENT Right 06/23/2018   Procedure: RIGHT ARTHROSCOPY WRIST DEBRIDEMENT LUNO TRIQUETRAL TEAR SHRINKAGE;  Surgeon: Daryll Brod, MD;  Location: [place];  Service: Orthopedics;  Laterality: Right;  AXILLARY BLAOCK    Family History  Problem Relation Age of Onset  . Diabetes Father   . Heart attack Father   . Diabetes Brother   . Hypertension Brother   . Diabetes Brother   . Hypertension Brother   . Diabetes Brother   . Diabetes Brother   . Diabetes Brother   . Cancer Mother        unsure primary, deceased at age 53  . Diabetes Other   . Colon cancer Neg Hx     Social History:  reports that she has been smoking cigarettes. She has a 18.00 pack-year smoking history. She has never used smokeless tobacco. She reports that she does not drink alcohol or use  drugs.   Medications Prior to Admission  Medication Sig Dispense Refill  . amLODipine (NORVASC) 2.5 MG tablet Take 1 tablet by mouth once daily 90 tablet 0  . aspirin EC 81 MG tablet Take 81 mg by mouth daily.    Marland Kitchen atorvastatin (LIPITOR) 80 MG tablet Take 1 tablet (80 mg total) by mouth daily at 6 PM. (Patient taking differently: Take 80 mg by mouth daily. ) 90 tablet 3  . eletriptan (RELPAX) 40 MG tablet TAKE 1 TABLET BY MOUTH ONCE DAILY AS NEEDED FOR  MIGRAINE **REPEAT IN 2 HOURS IF NEEDED** (Patient taking differently: Take 40 mg by  mouth every 2 (two) hours as needed for migraine. ) 60 tablet 1  . Ibuprofen (ADVIL MIGRAINE) 200 MG CAPS Take 400 mg by mouth daily as needed (migraines).     . metoprolol succinate (TOPROL-XL) 50 MG 24 hr tablet Take 1 tablet (50 mg total) by mouth daily. Take with or immediately following a meal. 90 tablet 1  . omeprazole (PRILOSEC) 20 MG capsule Take 1 capsule (20 mg total) by mouth daily. 90 capsule 3  . nitroGLYCERIN (NITROSTAT) 0.4 MG SL tablet Place 1 tablet (0.4 mg total) under the tongue every 5 (five) minutes as needed for chest pain. 90 tablet 3     Physical Exam: Right shoulder demonstrates profoundly restricted and painful motion but good strength.  Plain radiographs show overall good preservation of the joint space.  Vitals  Temp:  [98.7 F (37.1 C)] 98.7 F (37.1 C) (04/01 1041) Pulse Rate:  [82] 82 (04/01 1041) Resp:  [14] 14 (04/01 1041) BP: (116)/(102) 116/102 (04/01 1041) SpO2:  [100 %] 100 % (04/01 1041) Weight:  [68.9 kg] 68.9 kg (04/01 1039)  Assessment/Plan  Impression: Right shoulder adhesive capsulitis  Plan of Action: Procedure(s): Right shoulder arthroscopy, manipulation under anesthesia, lysis of adhesions, subacromial decompression, distal clavicle resection  Jessica Shaw 09/14/2019, 1:00 PM Contact # [phone]

## 2019-09-14 NOTE — Progress Notes (Signed)
Assisted Dr. Hodierne with right, ultrasound guided, interscalene  block. Side rails up, monitors on throughout procedure. See vital signs in flow sheet. Tolerated Procedure well. 

## 2019-09-14 NOTE — Discharge Instructions (Signed)
   Metta Clines. Supple, [name]., F.A.A.O.S. [facility] in Arthroscopic and Reconstructive Surgery of the Shoulder [phone] [address] [place], [place] [address] - Fax [phone]   POST-OP SHOULDER ARTHROSCOPY INSTRUCTIONS  1. Call the office at [phone] to schedule your first post-op appointment 7-10 days from the date of your surgery.  2. Leave the steri-strips in place over your incisions when performing dressing changes and showering. You may remove your dressings and begin showering 72 hours from surgery. You can expect drainage that is clear to bloody in nature that occasionally will soak through your dressings. If this occurs go ahead and perform a dressing change. The drainage should lessen daily and when there is no drainage from your incisions feel free to go without a dressing.  3. Wear your sling for comfort. You may come out of your sling for ad lib activity and even decide not to use the sling at all. If you find you are more comfortable in your sling, make sure you come out of your sling at least 3-4 times a day to do the exercises that are included below.  4. Range of motion to your elbow, wrist, and hand are encouraged 3-5 times daily. Exercise to your hand and fingers helps to reduce swelling you may experience.  5.Use the rental ice machine as instructed. Be careful to have a light layer of clothing in between pad and skin to avoid skin irritation  6. You may drive when safely off narcotics and muscle relaxants.  7.Pain control following an exparel block  To help control your post-operative pain you received a nerve block  performed with Exparel which is a long acting anesthetic (numbing agent) which can provide pain relief and sensations of numbness (and relief of pain) in the operative shoulder and arm for up to 3 days. Sometimes it provides mixed relief, meaning you may still have numbness in certain areas of the arm but can still  be able to move  parts of that arm, hand, and fingers. We recommend that your prescribed pain medications  be used as needed. We do not feel it is necessary to "pre medicate" and "stay ahead" of pain.  Taking narcotic pain medications when you are not having any pain can lead to unnecessary and potentially dangerous side effects.    8. Pain medications can produce constipation along with their use. If you experience this, the use of an over the counter stool softener or laxative daily is recommended.   9. For additional questions or concerns, please do not hesitate to call the office. If after hours there is an answering service to forward your concerns to the physician on call.   POST-OP EXERCISES  The pendulum exercises should be performed while bending at the waist as far over as possible thereby letting gravity do the work for you.  Range of Motion Exercises: Pendulum (circular)  Repeat 20 times. Do 3 sessions per day.     Range of Motion Exercises: Pendulum (side-to-side)  Repeat 20 times. Do 3 sessions per day.    Range of Motion Exercises (self-stretching activities):  Slide arm up wall with palm toward you, moving closer to the wall. Hold for 5 seconds.  Repeat 10 times. Do 3 sessions per day.

## 2019-09-14 NOTE — Transfer of Care (Signed)
Immediate Anesthesia Transfer of Care Note  Patient: Jessica Shaw  Procedure(s) Performed: Procedure(s) with comments: Right shoulder arthroscopy, manipulation under anesthesia, lysis of adhesions, subacromial decompression, distal clavicle resection (Right) - 68min  Patient Location: PACU  Anesthesia Type:General  Level of Consciousness:  sedated, patient cooperative and responds to stimulation  Airway & Oxygen Therapy:Patient Spontanous Breathing and Patient connected to face mask oxgen  Post-op Assessment:  Report given to PACU RN and Post -op Vital signs reviewed and stable  Post vital signs:  Reviewed and stable  Last Vitals:  Vitals:   09/14/19 1307 09/14/19 1322  BP: 105/75 104/70  Pulse: 74 62  Resp: 14 16  Temp:    SpO2: 0000000 0000000    Complications: No apparent anesthesia complications

## 2019-09-14 NOTE — Anesthesia Procedure Notes (Signed)
Procedure Name: Intubation Date/Time: 09/14/2019 1:56 PM Performed by: Anne Fu, CRNA Pre-anesthesia Checklist: Patient identified, Emergency Drugs available, Suction available, Patient being monitored and Timeout performed Patient Re-evaluated:Patient Re-evaluated prior to induction Oxygen Delivery Method: Circle system utilized Preoxygenation: Pre-oxygenation with 100% oxygen Induction Type: IV induction Ventilation: Mask ventilation without difficulty Laryngoscope Size: Mac and 4 Grade View: Grade I Tube type: Oral Tube size: 7.5 mm Number of attempts: 1 Airway Equipment and Method: Stylet Placement Confirmation: ETT inserted through vocal cords under direct vision,  positive ETCO2 and breath sounds checked- equal and bilateral Secured at: 21 cm Tube secured with: Tape Dental Injury: Teeth and Oropharynx as per pre-operative assessment

## 2019-09-14 NOTE — Op Note (Signed)
09/14/2019  2:59 PM  PATIENT:   Jessica Shaw  54 y.o. female  PRE-OPERATIVE DIAGNOSIS:  Right shoulder adhesive capsulitis, impingement, and symptomatic AC joint arthritis  POST-OPERATIVE DIAGNOSIS: Same with additional finding of degenerative labral tear  PROCEDURE:  1.  Right shoulder examination under anesthesia.  2.  Right shoulder manipulation under anesthesia  3.  Right shoulder glenohumeral joint diagnostic arthroscopy  4.  Arthroscopic synovectomy, anterosuperior capsular release, and labral debridement  5.  Arthroscopic subacromial decompression and bursectomy  6.  Arthroscopic distal clavicle resection  SURGEON:  Inocencia Murtaugh, Metta Clines. [name].  ASSISTANTS: Jenetta Loges, PA-C  ANESTHESIA:   General endotracheal and interscalene block with Exparel  EBL: Minimal  SPECIMEN: None  Drains: None   PATIENT DISPOSITION:  PACU - hemodynamically stable.    PLAN OF CARE: Discharge to home after PACU  Brief history:  Ms. Henning has been followed for chronic right shoulder pain with profoundly restricted mobility and symptoms that have been refractory to prolonged attempts at conservative management.  Due to her ongoing pain and functional mentation she is brought to the operating this time for planned right shoulder arthroscopy as described below  Preoperatively and counseled Ms. Grudzinski regarding treatment options as well as the potential risks versus benefits thereof.  Possible surgical complications were reviewed including bleeding, infection, neurovascular injury, persistence of pain, particular fracture and or dislocation, anesthetic complication, and possible need for additional surgery.  She understands, and accepts, and agrees with our planned procedure.  Procedure in detail:  After undergoing routine preop evaluation patient received prophylactic antibiotics and interscalene block with Exparel was established in the holding area by the anesthesia department.   Subsequently placed supine on the operating table and underwent the smooth induction of a general endotracheal anesthesia.  Placed into the left lateral decubitus position on a beanbag and appropriately padded and protected.  Right shoulder examination under anesthesia revealed approximately 120 degrees of forward elevation with a very firm endpoint and less than 20 degrees of external rotation.  At this point a manipulation was performed with palpable and audible release of adhesions achieving 180 degrees of elevation and 90 degrees of rotation.  The right arm was then suspended at 70 degrees of abduction with 10 pounds of traction in the right shoulder girdle region was sterilely prepped and draped in standard fashion.  Timeout was called.  A posterior portal was established into the glenohumeral joint and an anterior portal established under direct visualization.  Large hemarthrosis was evacuated.  Overall the articular surfaces were in good condition.  There was significant synovitis and marked reduction capsular volume and the capsule had been disrupted circumferentially.  I performed an anterosuperior capsular release using the Arthrex wand.  An extensive synovectomy was completed shaver was used to perform debridement of an anterior and superior labral tear.  The biceps tendon showed some tenosynovitis which was debrided but the overall caliber of the tendon was excellent with no evidence for proximal or distal instability.  The rotator cuff was carefully inspected and while there was some evidence for inflammation related to the adhesive capsulitis there was certainly no defects noted.  At this point fluid and instruments were removed from the glenohumeral joint.  The arm was then dropped down to 30 degrees of abduction with the arthroscope introduced in the subacromial space of the posterior portal and a direct lateral portal was established in the subacromial space.  Abundant dense bursal tissue multiple  adhesions were encountered and these were all  divided and excised with combination the shaver and the Stryker wand.  The wand was then used to remove the periosteum from the undersurface of the anterior half of the acromion and a subacromial decompression was performed with a bur creating a type I morphology.  Portal was then established directly anterior to the distal clavicle and the distal clavicle resection was performed with a bur.  Care was taken to confirm visualization of the entire circumference of the distal clavicle to ensure adequate removal of bone.  We then completed the subacromial/subdeltoid bursectomy and then obtained meticulous hemostasis.  Bursal surface the rotator cuff was carefully inspected and found to be intact.  Final hemostasis was obtained.  Fluid and instruments were removed.  The portals were closed with a Monocryl and Steri-Strip.  A dry dressing taped about the right shoulder and right arm placed in a sling immobilizer and the patient was awakened, extubated, and taken to the recovery room in stable condition.  Olivia Mackie Shuford PA-C was utilized as an Environmental consultant throughout this case essential for help with positioning of the patient, positioning extremity, tissue manipulation, wound closure, and intraoperative decision-making.   Marin Shutter MD   Contact # [phone]

## 2019-09-14 NOTE — Anesthesia Procedure Notes (Signed)
Anesthesia Regional Block: Interscalene brachial plexus block   Pre-Anesthetic Checklist: ,, timeout performed, Correct Patient, Correct Site, Correct Laterality, Correct Procedure, Correct Position, site marked, Risks and benefits discussed,  Surgical consent,  Pre-op evaluation,  At surgeon's request and post-op pain management  Laterality: Right  Prep: chloraprep       Needles:  Injection technique: Single-shot  Needle Type: Echogenic Stimulator Needle     Needle Length: 5cm  Needle Gauge: 22     Additional Needles:   Procedures:, nerve stimulator,,,,,,,   Nerve Stimulator or Paresthesia:  Response: biceps flexion, 0.45 mA,   Additional Responses:   Narrative:  Start time: 09/14/2019 1:01 PM End time: 09/14/2019 1:07 PM Injection made incrementally with aspirations every 5 mL.  Performed by: Personally  Anesthesiologist: Albertha Ghee, MD  Additional Notes: Functioning IV was confirmed and monitors were applied.  A 68mm 22ga Arrow echogenic stimulator needle was used. Sterile prep and drape,hand hygiene and sterile gloves were used.  Negative aspiration and negative test dose prior to incremental administration of local anesthetic. The patient tolerated the procedure well.  Ultrasound guidance: relevent anatomy identified, needle position confirmed, local anesthetic spread visualized around nerve(s), vascular puncture avoided.  Image printed for medical record.

## 2019-09-15 DIAGNOSIS — Z86718 Personal history of other venous thrombosis and embolism: Secondary | ICD-10-CM | POA: Diagnosis not present

## 2019-09-15 DIAGNOSIS — M25511 Pain in right shoulder: Secondary | ICD-10-CM | POA: Diagnosis not present

## 2019-09-15 DIAGNOSIS — R600 Localized edema: Secondary | ICD-10-CM | POA: Diagnosis not present

## 2019-09-15 DIAGNOSIS — Z4889 Encounter for other specified surgical aftercare: Secondary | ICD-10-CM | POA: Diagnosis not present

## 2019-09-16 DIAGNOSIS — Z4889 Encounter for other specified surgical aftercare: Secondary | ICD-10-CM | POA: Diagnosis not present

## 2019-09-16 DIAGNOSIS — Z86718 Personal history of other venous thrombosis and embolism: Secondary | ICD-10-CM | POA: Diagnosis not present

## 2019-09-16 DIAGNOSIS — R600 Localized edema: Secondary | ICD-10-CM | POA: Diagnosis not present

## 2019-09-17 DIAGNOSIS — R600 Localized edema: Secondary | ICD-10-CM | POA: Diagnosis not present

## 2019-09-17 DIAGNOSIS — Z4889 Encounter for other specified surgical aftercare: Secondary | ICD-10-CM | POA: Diagnosis not present

## 2019-09-17 DIAGNOSIS — Z86718 Personal history of other venous thrombosis and embolism: Secondary | ICD-10-CM | POA: Diagnosis not present

## 2019-09-18 DIAGNOSIS — R600 Localized edema: Secondary | ICD-10-CM | POA: Diagnosis not present

## 2019-09-18 DIAGNOSIS — Z4889 Encounter for other specified surgical aftercare: Secondary | ICD-10-CM | POA: Diagnosis not present

## 2019-09-18 DIAGNOSIS — Z86718 Personal history of other venous thrombosis and embolism: Secondary | ICD-10-CM | POA: Diagnosis not present

## 2019-09-18 DIAGNOSIS — M25511 Pain in right shoulder: Secondary | ICD-10-CM | POA: Diagnosis not present

## 2019-09-18 NOTE — Anesthesia Postprocedure Evaluation (Addendum)
Anesthesia Post Note  Patient: Jessica Shaw  Procedure(s) Performed: Right shoulder arthroscopy, manipulation under anesthesia, lysis of adhesions, subacromial decompression, distal clavicle resection (Right )     Patient location during evaluation: PACU Anesthesia Type: General Level of consciousness: awake and alert Pain management: pain level controlled Vital Signs Assessment: post-procedure vital signs reviewed and stable Respiratory status: spontaneous breathing, nonlabored ventilation, respiratory function stable and patient connected to nasal cannula oxygen Cardiovascular status: blood pressure returned to baseline and stable Postop Assessment: no apparent nausea or vomiting Anesthetic complications: no    Last Vitals:  Vitals:   09/14/19 1600 09/14/19 1615  BP: 114/69 125/67  Pulse: 66 69  Resp: 16 17  Temp:    SpO2: 93% 94%    Last Pain:  Vitals:   09/15/19 1136  TempSrc:   PainSc: 0-No pain                 Tiarrah Saville S

## 2019-09-19 ENCOUNTER — Other Ambulatory Visit: Payer: Self-pay

## 2019-09-19 ENCOUNTER — Encounter (HOSPITAL_COMMUNITY): Payer: Self-pay

## 2019-09-19 ENCOUNTER — Ambulatory Visit (HOSPITAL_COMMUNITY): Payer: BLUE CROSS/BLUE SHIELD

## 2019-09-19 DIAGNOSIS — E785 Hyperlipidemia, unspecified: Secondary | ICD-10-CM | POA: Diagnosis not present

## 2019-09-19 DIAGNOSIS — Z8673 Personal history of transient ischemic attack (TIA), and cerebral infarction without residual deficits: Secondary | ICD-10-CM | POA: Diagnosis not present

## 2019-09-19 DIAGNOSIS — M25611 Stiffness of right shoulder, not elsewhere classified: Secondary | ICD-10-CM

## 2019-09-19 DIAGNOSIS — Z9682 Presence of neurostimulator: Secondary | ICD-10-CM | POA: Diagnosis not present

## 2019-09-19 DIAGNOSIS — M7501 Adhesive capsulitis of right shoulder: Secondary | ICD-10-CM | POA: Diagnosis not present

## 2019-09-19 DIAGNOSIS — Z8774 Personal history of (corrected) congenital malformations of heart and circulatory system: Secondary | ICD-10-CM | POA: Diagnosis not present

## 2019-09-19 DIAGNOSIS — K219 Gastro-esophageal reflux disease without esophagitis: Secondary | ICD-10-CM | POA: Diagnosis not present

## 2019-09-19 DIAGNOSIS — R29898 Other symptoms and signs involving the musculoskeletal system: Secondary | ICD-10-CM

## 2019-09-19 DIAGNOSIS — Z8249 Family history of ischemic heart disease and other diseases of the circulatory system: Secondary | ICD-10-CM | POA: Diagnosis not present

## 2019-09-19 DIAGNOSIS — G4733 Obstructive sleep apnea (adult) (pediatric): Secondary | ICD-10-CM | POA: Diagnosis not present

## 2019-09-19 DIAGNOSIS — I2510 Atherosclerotic heart disease of native coronary artery without angina pectoris: Secondary | ICD-10-CM | POA: Diagnosis not present

## 2019-09-19 DIAGNOSIS — M19011 Primary osteoarthritis, right shoulder: Secondary | ICD-10-CM | POA: Diagnosis not present

## 2019-09-19 DIAGNOSIS — Z79899 Other long term (current) drug therapy: Secondary | ICD-10-CM | POA: Diagnosis not present

## 2019-09-19 DIAGNOSIS — F17200 Nicotine dependence, unspecified, uncomplicated: Secondary | ICD-10-CM | POA: Diagnosis not present

## 2019-09-19 DIAGNOSIS — Z4889 Encounter for other specified surgical aftercare: Secondary | ICD-10-CM | POA: Diagnosis not present

## 2019-09-19 DIAGNOSIS — R600 Localized edema: Secondary | ICD-10-CM | POA: Diagnosis not present

## 2019-09-19 DIAGNOSIS — M25511 Pain in right shoulder: Secondary | ICD-10-CM

## 2019-09-19 DIAGNOSIS — Z7982 Long term (current) use of aspirin: Secondary | ICD-10-CM | POA: Diagnosis not present

## 2019-09-19 DIAGNOSIS — Z86718 Personal history of other venous thrombosis and embolism: Secondary | ICD-10-CM | POA: Diagnosis not present

## 2019-09-19 NOTE — Therapy (Signed)
[place], [place], [address] Phone: [phone]   Fax:  [phone]  Occupational Therapy Evaluation  Patient Details  Name: Jessica Shaw MRN: [id number] Date of Birth: Apr 17, 1966 Referring Provider (OT): Justice Britain, MD   Encounter Date: 09/19/2019  OT End of Session - 09/19/19 0944    Visit Number  1    Number of Visits  13    Date for OT Re-Evaluation  10/31/19    Authorization Type  BCBS    Authorization Time Period  30 visit limit. 2 used at [facility]. $25 co-pay    Authorization - Visit Number  1    Authorization - Number of Visits  28    OT Start Time  0900    OT Stop Time  [phone]    OT Time Calculation (min)  38 min    Activity Tolerance  Patient tolerated treatment well    Behavior During Therapy  WFL for tasks assessed/performed       Past Medical History:  Diagnosis Date  . Complication of anesthesia   . Coronary artery disease    PFO closure 2006  . Embolism - blood clot    arm   left   . GERD (gastroesophageal reflux disease)   . HLD (hyperlipidemia)   . Migraine   . PONV (postoperative nausea and vomiting)   . Sleep apnea    mild OSA, no CPAP  . Smoker   . Spinal cord stimulator status 2006   in lower back, pt patient  . Stroke [facility])    TIA, no deficits    Past Surgical History:  Procedure Laterality Date  . CARDIAC CATHETERIZATION    . CHOLECYSTECTOMY    . COLONOSCOPY  08/04/2005   [id number] external hemorrhoids and anal papilla, otherwise normal  . COLONOSCOPY N/A 10/07/2012   Procedure: COLONOSCOPY;  Surgeon: Daneil Dolin, MD;  [facility];  Service: Endoscopy;  Laterality: N/A;  10:30  . COLONOSCOPY N/A 10/13/2016   Procedure: COLONOSCOPY;  Surgeon: Daneil Dolin, MD;  [facility];  Service: Endoscopy;  Laterality: N/A;  9:00am  . ESOPHAGOGASTRODUODENOSCOPY  04/20/2003   [id number] changes of reflux esophagitis/Limited gastroesophageal junction,  otherwise normal  . ESOPHAGOGASTRODUODENOSCOPY N/A 10/07/2012   Procedure: ESOPHAGOGASTRODUODENOSCOPY (EGD);  Surgeon: Daneil Dolin, MD;  [facility];  Service: Endoscopy;  Laterality: N/A;  . INTRAVASCULAR PRESSURE WIRE/FFR STUDY N/A 03/14/2019   Procedure: INTRAVASCULAR PRESSURE WIRE/FFR STUDY;  Surgeon: Nigel Mormon, MD;  Location: [place] [facility];  Service: Cardiovascular;  Laterality: N/A;  . LEFT HEART CATH AND CORONARY ANGIOGRAPHY N/A 03/14/2019   Procedure: LEFT HEART CATH AND CORONARY ANGIOGRAPHY;  Surgeon: Nigel Mormon, MD;  Location: [place] [facility];  Service: Cardiovascular;  Laterality: N/A;  . PATENT FORAMEN OVALE CLOSURE    . POLYPECTOMY  10/13/2016   Procedure: POLYPECTOMY;  Surgeon: Daneil Dolin, MD;  [facility];  Service: Endoscopy;;  colon  . SHOULDER ARTHROSCOPY     left  . SPINAL CORD STIMULATOR IMPLANT    . TUBAL LIGATION    . WRIST ARTHROSCOPY WITH DEBRIDEMENT Right 06/23/2018   Procedure: RIGHT ARTHROSCOPY WRIST DEBRIDEMENT LUNO TRIQUETRAL TEAR SHRINKAGE;  Surgeon: Daryll Brod, MD;  Location: [place];  Service: Orthopedics;  Laterality: Right;  AXILLARY BLAOCK    There were no vitals filed for this visit.  Subjective Assessment - 09/19/19 0906    Subjective  S: I wanted to do my surgery and get my healing started so I could enjoy things this summer.    Pertinent History  Patient is a 54 y/o female S/P right shoulder arthroscopy/MUA completed on 09/14/19. Patient completed an initial evaluation and 1 treatment session with [facility] before transferring to this clinic. Dr. Onnie Graham has referred patient to occupational therapy for evaluation and treatment.    Patient Stated Goals  To be able to return to outdoor summer activities and be able to gain the strength back in her right arm.    Currently in Pain?  Yes    Pain Score  5     Pain Location  Shoulder    Pain Orientation  Right    Pain Descriptors /  Indicators  Aching;Dull    Pain Type  Surgical pain    Pain Radiating Towards  up into neck    Pain Onset  In the past 7 days    Pain Frequency  Constant    Aggravating Factors   movement, stretching    Pain Relieving Factors  pain medication    Effect of Pain on Daily Activities  Moderate effect    Multiple Pain Sites  No        [facility] OT Assessment - 09/19/19 0908      Assessment   Medical Diagnosis  Right shoulder arthroscopy    Referring Provider (OT)  Justice Britain, MD    Onset Date/Surgical Date  09/14/19    Hand Dominance  Right   write with left   Next MD Visit  09/22/19    Prior Therapy  Pt completed PT eval and 1 treatment at [facility] for this condition      Precautions   Precautions  Shoulder    Type of Shoulder Precautions  P/ROM and AA/ROM      Restrictions   Weight Bearing Restrictions  Yes      Balance Screen   Has the patient fallen in the past 6 months  No    Has the patient had a decrease in activity level because of a fear of falling?   No    Is the patient reluctant to leave their home because of a fear of falling?   No      Home  Environment   Family/patient expects to be discharged to:  Private residence      Prior Function   Level of Independence  Independent    Vocation  Full time employment    Tree surgeon    Leisure  Enjoys outdoor activities. Kayaking. riding her motorcycle.      ADL   ADL comments  Difficulty using her RUE as her typical dominant extremity for lifting, reaching, and strength type activities.       Mobility   Mobility Status  Independent      Written Expression   Dominant Hand  Right      Vision - History   Baseline Vision  No visual deficits      Cognition   Overall Cognitive Status  Within Functional Limits for tasks assessed      Observation/Other Assessments   Focus on Therapeutic Outcomes (FOTO)   Next session      ROM / Strength   AROM / PROM / Strength   AROM;PROM;Strength      Palpation   Palpation comment  Moderate fascial restrictions noted in right upper arm, trapezius, and scapularis region.  AROM   Overall AROM Comments  Assessed seated. IR/er adducted    AROM Assessment Site  Shoulder    Right/Left Shoulder  Right    Right Shoulder Flexion  130 Degrees    Right Shoulder ABduction  134 Degrees    Right Shoulder Internal Rotation  90 Degrees    Right Shoulder External Rotation  40 Degrees      PROM   Overall PROM Comments  Assessed supine. IR/er adducted    PROM Assessment Site  Shoulder    Right/Left Shoulder  Right    Right Shoulder Flexion  140 Degrees    Right Shoulder ABduction  180 Degrees    Right Shoulder Internal Rotation  90 Degrees    Right Shoulder External Rotation  40 Degrees      Strength   Overall Strength Comments  Assessed seated. IR/er adducted    Strength Assessment Site  Shoulder    Right/Left Shoulder  Right    Right Shoulder Flexion  3/5    Right Shoulder ABduction  3/5    Right Shoulder Internal Rotation  4+/5    Right Shoulder External Rotation  4-/5               OT Treatments/Exercises (OP) - 09/19/19 0944      Exercises   Exercises  Shoulder      Shoulder Exercises: Supine   Protraction  AAROM;10 reps    Horizontal ABduction  AAROM;10 reps    External Rotation  AAROM;10 reps    Internal Rotation  AAROM;10 reps    Flexion  AAROM;10 reps    ABduction  AAROM;10 reps            OT Education - 09/19/19 0926    Education Details  AA/ROM shoulder supine for only.    Person(s) Educated  Patient    Methods  Explanation;Demonstration;Verbal cues;Handout    Comprehension  Verbalized understanding;Returned demonstration       OT Short Term Goals - 09/19/19 1015      OT SHORT TERM GOAL #1   Title  Patient will be educated and independent with HEP in order to faciliate progress in therapy and increase ability to return to using her RUE for daily tasks.    Time  3     Period  Weeks    Status  New    Target Date  10/10/19      OT SHORT TERM GOAL #2   Title  Patient will increase RUE strength to 4-/5 to increase ability to complete light weight lifting activities with less difficulty.    Time  3    Period  Weeks    Status  New      OT SHORT TERM GOAL #3   Title  Patient will decrease fascial restriction in the RUE to min amount in order to increase functional mobility need to complete reaching tasks.    Time  3    Period  Weeks    Status  New      OT SHORT TERM GOAL #4   Title  Patient will report a decrease in pain level of approximately 4/10 when  completing basic daily tasks.    Time  3    Period  Weeks    Status  New        OT Long Term Goals - 09/19/19 1228      OT LONG TERM GOAL #1   Title  Patient will reach her highest level of independence  while using her RUE for all daily and work related tasks 75% or more of the time.    Time  6    Period  Weeks    Status  New    Target Date  10/31/19      OT LONG TERM GOAL #2   Title  Patient will increase RUE shoulder strength to 4+/5 in order to complete normal lifting tasks with less difficulty.    Time  6    Period  Weeks    Status  New      OT LONG TERM GOAL #3   Title  Patient will increase her RUE A/ROM to WNL in order to provide assist for overhead tasks.    Time  6    Period  Weeks    Status  New      OT LONG TERM GOAL #4   Title  Patient will report a decrease in pain of approximately 2/10 or less when using her RUE for normal daily tasks.    Time  6    Period  Weeks    Status  New      OT LONG TERM GOAL #5   Title  Patient will decrease fascial restrictions to trace amount in order to increase functional mobility needed to complete reach tasks.    Time  6    Period  Weeks    Status  New            Plan - 09/19/19 1008    Clinical Impression Statement  A: Patient is a 54 y/o female S/P right shoulder arthroscopy/MUA with increased pain, fascial restrictions, and  decreased ROM and strength resulting in difficulty completing daily and work releated tasks using her right UE.    OT Occupational Profile and History  Problem Focused Assessment - Including review of records relating to presenting problem    Occupational performance deficits (Please refer to evaluation for details):  ADL's    Body Structure / Function / Physical Skills  ADL;UE functional use;Fascial restriction;Pain;ROM;IADL;Strength    Rehab Potential  Excellent    Clinical Decision Making  Limited treatment options, no task modification necessary    Comorbidities Affecting Occupational Performance:  Presence of comorbidities impacting occupational performance    Comorbidities impacting occupational performance description:  History of CVA and Heart attack    Modification or Assistance to Complete Evaluation   No modification of tasks or assist necessary to complete eval    OT Frequency  Other (comment)   3X a week for 1 week then 2X a week for remaining time.   OT Duration  6 weeks    OT Treatment/Interventions  Self-care/ADL training;Ultrasound;Electrical Stimulation;Cryotherapy;Moist Heat;Neuromuscular education;Therapeutic exercise;Manual Therapy;Therapeutic activities;Passive range of motion;Patient/family education;DME and/or AE instruction    Plan  P: Pt will benefit from skilled OT services to increase functional performance during daily tasks using her RUE. Treatment plan: P/ROM, AA/ROM, A/ROM, and general strengthening. Myofascial release, manual stretching. Modalities PRN.    OT Home Exercise Plan  Eval: AA/ROM shoulder supine.    Consulted and Agree with Plan of Care  Patient       Patient will benefit from skilled therapeutic intervention in order to improve the following deficits and impairments:   Body Structure / Function / Physical Skills: ADL, UE functional use, Fascial restriction, Pain, ROM, IADL, Strength       Visit Diagnosis: Other symptoms and signs involving the  musculoskeletal system - Plan: Ot plan of care cert/re-cert  Stiffness  of right shoulder, not elsewhere classified - Plan: Ot plan of care cert/re-cert  Acute pain of right shoulder - Plan: Ot plan of care cert/re-cert    Problem List Patient Active Problem List   Diagnosis Date Noted  . Abnormal stress test 03/09/2019  . Coronary artery calcification 02/04/2019  . Exertional dyspnea 02/04/2019  . Palpitations 02/04/2019  . CAD (coronary atherosclerotic disease) 01/27/2019  . Aortic atherosclerosis ([place]) 01/27/2019  . Migraine 09/06/2018  . Carpal instability of right wrist 06/20/2018  . S/P patent foramen ovale closure 02/28/2015  . Chronic shoulder pain 07/24/2014  . Hyperlipidemia, unspecified 08/16/2013  . Hot flashes, menopausal 08/14/2013  . Smoker 08/07/2013  . 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 GERD (gastroesophageal reflux disease) 09/22/2012   Ailene Ravel, OTR/L,CBIS  [phone]  09/19/2019, 12:34 PM  [place] [address] [place], [place], [address] Phone: [phone]   Fax:  [phone]  Name: Jessica Shaw MRN: [id number] Date of Birth: 1965/12/28

## 2019-09-19 NOTE — Patient Instructions (Signed)

## 2019-09-20 ENCOUNTER — Ambulatory Visit (HOSPITAL_COMMUNITY): Payer: BLUE CROSS/BLUE SHIELD | Attending: Orthopedic Surgery

## 2019-09-20 ENCOUNTER — Other Ambulatory Visit: Payer: Self-pay

## 2019-09-20 DIAGNOSIS — Z4889 Encounter for other specified surgical aftercare: Secondary | ICD-10-CM | POA: Diagnosis not present

## 2019-09-20 DIAGNOSIS — R600 Localized edema: Secondary | ICD-10-CM | POA: Diagnosis not present

## 2019-09-20 DIAGNOSIS — R29898 Other symptoms and signs involving the musculoskeletal system: Secondary | ICD-10-CM | POA: Diagnosis not present

## 2019-09-20 DIAGNOSIS — M25511 Pain in right shoulder: Secondary | ICD-10-CM | POA: Diagnosis not present

## 2019-09-20 DIAGNOSIS — M25611 Stiffness of right shoulder, not elsewhere classified: Secondary | ICD-10-CM | POA: Diagnosis not present

## 2019-09-20 DIAGNOSIS — Z86718 Personal history of other venous thrombosis and embolism: Secondary | ICD-10-CM | POA: Diagnosis not present

## 2019-09-20 NOTE — Therapy (Signed)
[place], [place], [address] Phone: [phone]   Fax:  [phone]  Occupational Therapy Treatment  Patient Details  Name: Jessica Shaw MRN: [id number] Date of Birth: [birth year] Referring Provider (OT): Justice Britain, MD   Encounter Date: 09/20/2019  OT End of Session - 09/20/19 1225    Visit Number  2    Number of Visits  13    Date for OT Re-Evaluation  10/31/19    Authorization Type  BCBS    Authorization Time Period  30 visit limit. 2 used at [facility]. $25 co-pay    Authorization - Visit Number  2    Authorization - Number of Visits  28    OT Start Time  1115    OT Stop Time  1155    OT Time Calculation (min)  40 min    Activity Tolerance  Patient tolerated treatment well    Behavior During Therapy  WFL for tasks assessed/performed       Past Medical History:  Diagnosis Date  . Complication of anesthesia   . Coronary artery disease    PFO closure 2006  . Embolism - blood clot    arm   left   . GERD (gastroesophageal reflux disease)   . HLD (hyperlipidemia)   . Migraine   . PONV (postoperative nausea and vomiting)   . Sleep apnea    mild OSA, no CPAP  . Smoker   . Spinal cord stimulator status 2006   in lower back, pt patient  . Stroke [facility])    TIA, no deficits    Past Surgical History:  Procedure Laterality Date  . CARDIAC CATHETERIZATION    . CHOLECYSTECTOMY    . COLONOSCOPY  08/04/2005   [id number] external hemorrhoids and anal papilla, otherwise normal  . COLONOSCOPY N/A 10/07/2012   Procedure: COLONOSCOPY;  Surgeon: Daneil Dolin, MD;  [facility];  Service: Endoscopy;  Laterality: N/A;  10:30  . COLONOSCOPY N/A 10/13/2016   Procedure: COLONOSCOPY;  Surgeon: Daneil Dolin, MD;  [facility];  Service: Endoscopy;  Laterality: N/A;  9:00am  . ESOPHAGOGASTRODUODENOSCOPY  04/20/2003   [id number] changes of reflux esophagitis/Limited gastroesophageal junction,  otherwise normal  . ESOPHAGOGASTRODUODENOSCOPY N/A 10/07/2012   Procedure: ESOPHAGOGASTRODUODENOSCOPY (EGD);  Surgeon: Daneil Dolin, MD;  [facility];  Service: Endoscopy;  Laterality: N/A;  . INTRAVASCULAR PRESSURE WIRE/FFR STUDY N/A 03/14/2019   Procedure: INTRAVASCULAR PRESSURE WIRE/FFR STUDY;  Surgeon: Nigel Mormon, MD;  Location: [place] [facility];  Service: Cardiovascular;  Laterality: N/A;  . LEFT HEART CATH AND CORONARY ANGIOGRAPHY N/A 03/14/2019   Procedure: LEFT HEART CATH AND CORONARY ANGIOGRAPHY;  Surgeon: Nigel Mormon, MD;  Location: [place] [facility];  Service: Cardiovascular;  Laterality: N/A;  . PATENT FORAMEN OVALE CLOSURE    . POLYPECTOMY  10/13/2016   Procedure: POLYPECTOMY;  Surgeon: Daneil Dolin, MD;  [facility];  Service: Endoscopy;;  colon  . SHOULDER ARTHROSCOPY     left  . SPINAL CORD STIMULATOR IMPLANT    . TUBAL LIGATION    . WRIST ARTHROSCOPY WITH DEBRIDEMENT Right 06/23/2018   Procedure: RIGHT ARTHROSCOPY WRIST DEBRIDEMENT LUNO TRIQUETRAL TEAR SHRINKAGE;  Surgeon: Daryll Brod, MD;  Location: [place];  Service: Orthopedics;  Laterality: Right;  AXILLARY BLAOCK    There were no vitals filed for this visit.      [facility] OT Assessment - 09/20/19  1222      Assessment   Medical Diagnosis  Right shoulder arthroscopy      Precautions   Precautions  Shoulder    Type of Shoulder Precautions  P/ROM and AA/ROM      Observation/Other Assessments   Focus on Therapeutic Outcomes (FOTO)   54/100               OT Treatments/Exercises (OP) - 09/20/19 1153      Exercises   Exercises  Shoulder      Shoulder Exercises: Supine   Protraction  PROM;10 reps    Horizontal ABduction  PROM;10 reps    External Rotation  PROM;10 reps    Internal Rotation  PROM;10 reps    Flexion  PROM;10 reps    ABduction  PROM;10 reps      Shoulder Exercises: Pulleys   Flexion  1 minute   standing   ABduction  1  minute   standing     Manual Therapy   Manual Therapy  Myofascial release    Manual therapy comments  manual therapy completed prior to exercises.     Myofascial Release  Myofascial release and manual stretching completed to right upper arm, trapezius, and scapularis region to decrease fascial restrictions and increase                OT Short Term Goals - 09/20/19 1225      OT SHORT TERM GOAL #1   Title  Patient will be educated and independent with HEP in order to faciliate progress in therapy and increase ability to return to using her RUE for daily tasks.    Time  3    Period  Weeks    Status  On-going    Target Date  10/10/19      OT SHORT TERM GOAL #2   Title  Patient will increase RUE strength to 4-/5 to increase ability to complete light weight lifting activities with less difficulty.    Time  3    Period  Weeks    Status  On-going      OT SHORT TERM GOAL #3   Title  Patient will decrease fascial restriction in the RUE to min amount in order to increase functional mobility need to complete reaching tasks.    Time  3    Period  Weeks    Status  On-going      OT SHORT TERM GOAL #4   Title  Patient will report a decrease in pain level of approximately 4/10 when  completing basic daily tasks.    Time  3    Period  Weeks    Status  On-going        OT Long Term Goals - 09/20/19 1130      OT LONG TERM GOAL #1   Title  Patient will reach her highest level of independence while using her RUE for all daily and work related tasks 75% or more of the time.    Time  6    Period  Weeks    Status  On-going      OT LONG TERM GOAL #2   Title  Patient will increase RUE shoulder strength to 4+/5 in order to complete normal lifting tasks with less difficulty.    Time  6    Period  Weeks    Status  On-going      OT LONG TERM GOAL #3   Title  Patient will increase her RUE A/ROM  to WNL in order to provide assist for overhead tasks.    Time  6    Period  Weeks     Status  On-going      OT LONG TERM GOAL #4   Title  Patient will report a decrease in pain of approximately 2/10 or less when using her RUE for normal daily tasks.    Time  6    Period  Weeks    Status  On-going      OT LONG TERM GOAL #5   Title  Patient will decrease fascial restrictions to trace amount in order to increase functional mobility needed to complete reach tasks.    Time  6    Period  Weeks    Status  On-going            Plan - 09/20/19 1225    Clinical Impression Statement  A: Reviewed therapy goals and completed FOTO while discussing the use. Initiated myofascial release, manual stretching, and isometric strengthening. VC for form and technique were provided. Manual techniques completed to address maximum fascial restrictions in the right upper arm and upper trapezius region. Encouraged use of ice to decrease pain and swelling.    Body Structure / Function / Physical Skills  ADL;UE functional use;Fascial restriction;Pain;ROM;IADL;Strength    Plan  P: Complete supine AA/ROM. Attempt standing AA/ROM if time allows.    Consulted and Agree with Plan of Care  Patient       Patient will benefit from skilled therapeutic intervention in order to improve the following deficits and impairments:   Body Structure / Function / Physical Skills: ADL, UE functional use, Fascial restriction, Pain, ROM, IADL, Strength       Visit Diagnosis: Other symptoms and signs involving the musculoskeletal system  Stiffness of right shoulder, not elsewhere classified  Acute pain of right shoulder    Problem List Patient Active Problem List   Diagnosis Date Noted  . Abnormal stress test 03/09/2019  . Coronary artery calcification 02/04/2019  . Exertional dyspnea 02/04/2019  . Palpitations 02/04/2019  . CAD (coronary atherosclerotic disease) 01/27/2019  . Aortic atherosclerosis ([place]) 01/27/2019  . Migraine 09/06/2018  . Carpal instability of right wrist 06/20/2018  . S/P patent  foramen ovale closure 02/28/2015  . Chronic shoulder pain 07/24/2014  . Hyperlipidemia, unspecified 08/16/2013  . Hot flashes, menopausal 08/14/2013  . Smoker 08/07/2013  . 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 GERD (gastroesophageal reflux disease) 09/22/2012   Ailene Ravel, OTR/L,CBIS  [phone]  09/20/2019, 12:28 PM  [place] [address] [place], [place], [address] Phone: [phone]   Fax:  [phone]  Name: Jessica Shaw MRN: [id number] Date of Birth: Jun 06, 1966

## 2019-09-21 DIAGNOSIS — R600 Localized edema: Secondary | ICD-10-CM | POA: Diagnosis not present

## 2019-09-21 DIAGNOSIS — Z4889 Encounter for other specified surgical aftercare: Secondary | ICD-10-CM | POA: Diagnosis not present

## 2019-09-21 DIAGNOSIS — Z86718 Personal history of other venous thrombosis and embolism: Secondary | ICD-10-CM | POA: Diagnosis not present

## 2019-09-22 ENCOUNTER — Ambulatory Visit (HOSPITAL_COMMUNITY): Payer: BLUE CROSS/BLUE SHIELD | Admitting: Occupational Therapy

## 2019-09-22 ENCOUNTER — Other Ambulatory Visit: Payer: Self-pay

## 2019-09-22 ENCOUNTER — Encounter (HOSPITAL_COMMUNITY): Payer: Self-pay

## 2019-09-22 DIAGNOSIS — M25511 Pain in right shoulder: Secondary | ICD-10-CM | POA: Diagnosis not present

## 2019-09-22 DIAGNOSIS — R29898 Other symptoms and signs involving the musculoskeletal system: Secondary | ICD-10-CM | POA: Diagnosis not present

## 2019-09-22 DIAGNOSIS — M25611 Stiffness of right shoulder, not elsewhere classified: Secondary | ICD-10-CM

## 2019-09-22 DIAGNOSIS — R600 Localized edema: Secondary | ICD-10-CM | POA: Diagnosis not present

## 2019-09-22 DIAGNOSIS — Z4889 Encounter for other specified surgical aftercare: Secondary | ICD-10-CM | POA: Diagnosis not present

## 2019-09-22 DIAGNOSIS — Z86718 Personal history of other venous thrombosis and embolism: Secondary | ICD-10-CM | POA: Diagnosis not present

## 2019-09-22 NOTE — Therapy (Signed)
[place], [place], [address] Phone: [phone]   Fax:  [phone]  Occupational Therapy Treatment  Patient Details  Name: Jessica Shaw MRN: [id number] Date of Birth: 1965/11/28 Referring Provider (OT): Justice Britain, MD   Encounter Date: 09/22/2019  OT End of Session - 09/22/19 1044    Visit Number  3    Number of Visits  13    Date for OT Re-Evaluation  10/31/19    Authorization Type  BCBS    Authorization Time Period  30 visit limit. 2 used at [facility]. $25 co-pay    Authorization - Visit Number  3    Authorization - Number of Visits  28    OT Start Time  [phone]    OT Stop Time  0945    OT Time Calculation (min)  40 min    Activity Tolerance  Patient tolerated treatment well    Behavior During Therapy  WFL for tasks assessed/performed       Past Medical History:  Diagnosis Date  . Complication of anesthesia   . Coronary artery disease    PFO closure 2006  . Embolism - blood clot    arm   left   . GERD (gastroesophageal reflux disease)   . HLD (hyperlipidemia)   . Migraine   . PONV (postoperative nausea and vomiting)   . Sleep apnea    mild OSA, no CPAP  . Smoker   . Spinal cord stimulator status 2006   in lower back, pt patient  . Stroke [facility])    TIA, no deficits    Past Surgical History:  Procedure Laterality Date  . CARDIAC CATHETERIZATION    . CHOLECYSTECTOMY    . COLONOSCOPY  08/04/2005   [id number] external hemorrhoids and anal papilla, otherwise normal  . COLONOSCOPY N/A 10/07/2012   Procedure: COLONOSCOPY;  Surgeon: Daneil Dolin, MD;  [facility];  Service: Endoscopy;  Laterality: N/A;  10:30  . COLONOSCOPY N/A 10/13/2016   Procedure: COLONOSCOPY;  Surgeon: Daneil Dolin, MD;  [facility];  Service: Endoscopy;  Laterality: N/A;  9:00am  . ESOPHAGOGASTRODUODENOSCOPY  04/20/2003   [id number] changes of reflux esophagitis/Limited gastroesophageal junction,  otherwise normal  . ESOPHAGOGASTRODUODENOSCOPY N/A 10/07/2012   Procedure: ESOPHAGOGASTRODUODENOSCOPY (EGD);  Surgeon: Daneil Dolin, MD;  [facility];  Service: Endoscopy;  Laterality: N/A;  . INTRAVASCULAR PRESSURE WIRE/FFR STUDY N/A 03/14/2019   Procedure: INTRAVASCULAR PRESSURE WIRE/FFR STUDY;  Surgeon: Nigel Mormon, MD;  Location: [place] [facility];  Service: Cardiovascular;  Laterality: N/A;  . LEFT HEART CATH AND CORONARY ANGIOGRAPHY N/A 03/14/2019   Procedure: LEFT HEART CATH AND CORONARY ANGIOGRAPHY;  Surgeon: Nigel Mormon, MD;  Location: [place] [facility];  Service: Cardiovascular;  Laterality: N/A;  . PATENT FORAMEN OVALE CLOSURE    . POLYPECTOMY  10/13/2016   Procedure: POLYPECTOMY;  Surgeon: Daneil Dolin, MD;  [facility];  Service: Endoscopy;;  colon  . SHOULDER ARTHROSCOPY     left  . SPINAL CORD STIMULATOR IMPLANT    . TUBAL LIGATION    . WRIST ARTHROSCOPY WITH DEBRIDEMENT Right 06/23/2018   Procedure: RIGHT ARTHROSCOPY WRIST DEBRIDEMENT LUNO TRIQUETRAL TEAR SHRINKAGE;  Surgeon: Daryll Brod, MD;  Location: [place];  Service: Orthopedics;  Laterality: Right;  AXILLARY BLAOCK    There were no vitals filed for this visit.  Subjective Assessment - 09/22/19 0906    Subjective  S: It's really bothering me this morning    Pertinent History  Patient is a 54 y/o female S/P right shoulder arthroscopy/MUA completed on 09/14/19. Patient completed an initial evaluation and 1 treatment session with [facility] before transferring to this clinic. Dr. Onnie Graham has referred patient to occupational therapy for evaluation and treatment.    Currently in Pain?  Yes    Pain Score  7     Pain Location  Shoulder    Pain Orientation  Right    Pain Descriptors / Indicators  Aching    Pain Type  Surgical pain    Pain Radiating Towards  Up into neck    Pain Onset  In the past 7 days    Pain Frequency  Constant    Aggravating Factors   2     Pain Relieving Factors  pain medication    Effect of Pain on Daily Activities  Moderate effect    Multiple Pain Sites  No                   OT Treatments/Exercises (OP) - 09/22/19 0932      Exercises   Exercises  Shoulder      Shoulder Exercises: Supine   Protraction  PROM;10 reps    Horizontal ABduction  PROM;10 reps    External Rotation  PROM;10 reps    Internal Rotation  PROM;10 reps;AAROM    Flexion  PROM;10 reps    ABduction  PROM;10 reps      Shoulder Exercises: Standing   Protraction  AROM;12 reps    Horizontal ABduction  AAROM;12 reps    ABduction  AAROM;12 reps      Manual Therapy   Manual Therapy  Myofascial release    Manual therapy comments  manual therapy completed prior to exercises.     Myofascial Release  Myofascial release and manual stretching completed to right upper arm, trapezius, and scapularis region to decrease fascial restrictions and increase ROM               OT Short Term Goals - 09/20/19 1225      OT SHORT TERM GOAL #1   Title  Patient will be educated and independent with HEP in order to faciliate progress in therapy and increase ability to return to using her RUE for daily tasks.    Time  3    Period  Weeks    Status  On-going    Target Date  10/10/19      OT SHORT TERM GOAL #2   Title  Patient will increase RUE strength to 4-/5 to increase ability to complete light weight lifting activities with less difficulty.    Time  3    Period  Weeks    Status  On-going      OT SHORT TERM GOAL #3   Title  Patient will decrease fascial restriction in the RUE to min amount in order to increase functional mobility need to complete reaching tasks.    Time  3    Period  Weeks    Status  On-going      OT SHORT TERM GOAL #4   Title  Patient will report a decrease in pain level of approximately 4/10 when  completing basic daily tasks.    Time  3    Period  Weeks    Status  On-going        OT Long Term Goals - 09/20/19 1130  OT LONG TERM GOAL #1   Title  Patient will reach her highest level of independence while using her RUE for all daily and work related tasks 75% or more of the time.    Time  6    Period  Weeks    Status  On-going      OT LONG TERM GOAL #2   Title  Patient will increase RUE shoulder strength to 4+/5 in order to complete normal lifting tasks with less difficulty.    Time  6    Period  Weeks    Status  On-going      OT LONG TERM GOAL #3   Title  Patient will increase her RUE A/ROM to WNL in order to provide assist for overhead tasks.    Time  6    Period  Weeks    Status  On-going      OT LONG TERM GOAL #4   Title  Patient will report a decrease in pain of approximately 2/10 or less when using her RUE for normal daily tasks.    Time  6    Period  Weeks    Status  On-going      OT LONG TERM GOAL #5   Title  Patient will decrease fascial restrictions to trace amount in order to increase functional mobility needed to complete reach tasks.    Time  6    Period  Weeks    Status  On-going            Plan - 09/22/19 1045    Clinical Impression Statement  A: Continued myofacial release, manual stretching and isometric strengthenging. Completed AAROM in standing. VC for form and technique were provided. Manual techniques completed to address facial restrictions in upper arm and trapezius    Plan  P: Continue standing AA/ROM, pully's       Patient will benefit from skilled therapeutic intervention in order to improve the following deficits and impairments:           Visit Diagnosis: Other symptoms and signs involving the musculoskeletal system  Stiffness of right shoulder, not elsewhere classified  Acute pain of right shoulder    Problem List Patient Active Problem List   Diagnosis Date Noted  . Abnormal stress test 03/09/2019  . Coronary artery calcification 02/04/2019  . Exertional dyspnea 02/04/2019  . Palpitations 02/04/2019  . CAD (coronary  atherosclerotic disease) 01/27/2019  . Aortic atherosclerosis ([place]) 01/27/2019  . Migraine 09/06/2018  . Carpal instability of right wrist 06/20/2018  . S/P patent foramen ovale closure 02/28/2015  . Chronic shoulder pain 07/24/2014  . Hyperlipidemia, unspecified 08/16/2013  . Hot flashes, menopausal 08/14/2013  . Smoker 08/07/2013  . 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 GERD (gastroesophageal reflux disease) 09/22/2012    Preston Fleeting, OTR/L 09/22/2019, 10:54 AM  [place], [place], [address] Phone: [phone]   Fax:  [phone]  Name: Jessica Shaw MRN: [id number] Date of Birth: 19-Nov-1965

## 2019-09-23 DIAGNOSIS — R600 Localized edema: Secondary | ICD-10-CM | POA: Diagnosis not present

## 2019-09-23 DIAGNOSIS — Z4889 Encounter for other specified surgical aftercare: Secondary | ICD-10-CM | POA: Diagnosis not present

## 2019-09-23 DIAGNOSIS — Z86718 Personal history of other venous thrombosis and embolism: Secondary | ICD-10-CM | POA: Diagnosis not present

## 2019-09-24 DIAGNOSIS — Z4889 Encounter for other specified surgical aftercare: Secondary | ICD-10-CM | POA: Diagnosis not present

## 2019-09-24 DIAGNOSIS — Z86718 Personal history of other venous thrombosis and embolism: Secondary | ICD-10-CM | POA: Diagnosis not present

## 2019-09-24 DIAGNOSIS — R600 Localized edema: Secondary | ICD-10-CM | POA: Diagnosis not present

## 2019-09-25 ENCOUNTER — Other Ambulatory Visit: Payer: Self-pay

## 2019-09-25 ENCOUNTER — Encounter (HOSPITAL_COMMUNITY): Payer: Self-pay

## 2019-09-25 ENCOUNTER — Ambulatory Visit (HOSPITAL_COMMUNITY): Payer: BLUE CROSS/BLUE SHIELD

## 2019-09-25 DIAGNOSIS — R600 Localized edema: Secondary | ICD-10-CM | POA: Diagnosis not present

## 2019-09-25 DIAGNOSIS — R29898 Other symptoms and signs involving the musculoskeletal system: Secondary | ICD-10-CM | POA: Diagnosis not present

## 2019-09-25 DIAGNOSIS — Z4889 Encounter for other specified surgical aftercare: Secondary | ICD-10-CM | POA: Diagnosis not present

## 2019-09-25 DIAGNOSIS — M25611 Stiffness of right shoulder, not elsewhere classified: Secondary | ICD-10-CM | POA: Diagnosis not present

## 2019-09-25 DIAGNOSIS — M25511 Pain in right shoulder: Secondary | ICD-10-CM | POA: Diagnosis not present

## 2019-09-25 DIAGNOSIS — Z86718 Personal history of other venous thrombosis and embolism: Secondary | ICD-10-CM | POA: Diagnosis not present

## 2019-09-25 NOTE — Therapy (Signed)
[place], [place], [address] Phone: [phone]   Fax:  [phone]  Occupational Therapy Treatment  Patient Details  Name: Jessica Shaw MRN: [id number] Date of Birth: 06-13-1966 Referring Provider (OT): Justice Britain, MD   Encounter Date: 09/25/2019  OT End of Session - 09/25/19 1550    Visit Number  4    Number of Visits  13    Date for OT Re-Evaluation  10/31/19    Authorization Type  BCBS    Authorization Time Period  30 visit limit. 2 used at [facility]. $25 co-pay    Authorization - Visit Number  4    Authorization - Number of Visits  28    OT Start Time  1304    OT Stop Time  1342    OT Time Calculation (min)  38 min    Activity Tolerance  Patient tolerated treatment well    Behavior During Therapy  WFL for tasks assessed/performed       Past Medical History:  Diagnosis Date  . Complication of anesthesia   . Coronary artery disease    PFO closure 2006  . Embolism - blood clot    arm   left   . GERD (gastroesophageal reflux disease)   . HLD (hyperlipidemia)   . Migraine   . PONV (postoperative nausea and vomiting)   . Sleep apnea    mild OSA, no CPAP  . Smoker   . Spinal cord stimulator status 2006   in lower back, pt patient  . Stroke [facility])    TIA, no deficits    Past Surgical History:  Procedure Laterality Date  . CARDIAC CATHETERIZATION    . CHOLECYSTECTOMY    . COLONOSCOPY  08/04/2005   [id number] external hemorrhoids and anal papilla, otherwise normal  . COLONOSCOPY N/A 10/07/2012   Procedure: COLONOSCOPY;  Surgeon: Daneil Dolin, MD;  [facility];  Service: Endoscopy;  Laterality: N/A;  10:30  . COLONOSCOPY N/A 10/13/2016   Procedure: COLONOSCOPY;  Surgeon: Daneil Dolin, MD;  [facility];  Service: Endoscopy;  Laterality: N/A;  9:00am  . ESOPHAGOGASTRODUODENOSCOPY  04/20/2003   [id number] changes of reflux esophagitis/Limited gastroesophageal junction,  otherwise normal  . ESOPHAGOGASTRODUODENOSCOPY N/A 10/07/2012   Procedure: ESOPHAGOGASTRODUODENOSCOPY (EGD);  Surgeon: Daneil Dolin, MD;  [facility];  Service: Endoscopy;  Laterality: N/A;  . INTRAVASCULAR PRESSURE WIRE/FFR STUDY N/A 03/14/2019   Procedure: INTRAVASCULAR PRESSURE WIRE/FFR STUDY;  Surgeon: Nigel Mormon, MD;  Location: [place] CV [facility];  Service: Cardiovascular;  Laterality: N/A;  . LEFT HEART CATH AND CORONARY ANGIOGRAPHY N/A 03/14/2019   Procedure: LEFT HEART CATH AND CORONARY ANGIOGRAPHY;  Surgeon: Nigel Mormon, MD;  Location: [facility];  Service: Cardiovascular;  Laterality: N/A;  . PATENT FORAMEN OVALE CLOSURE    . POLYPECTOMY  10/13/2016   Procedure: POLYPECTOMY;  Surgeon: Daneil Dolin, MD;  [facility];  Service: Endoscopy;;  colon  . SHOULDER ARTHROSCOPY     left  . SPINAL CORD STIMULATOR IMPLANT    . TUBAL LIGATION    . WRIST ARTHROSCOPY WITH DEBRIDEMENT Right 06/23/2018   Procedure: RIGHT ARTHROSCOPY WRIST DEBRIDEMENT LUNO TRIQUETRAL TEAR SHRINKAGE;  Surgeon: Daryll Brod, MD;  Location: [place];  Service: Orthopedics;  Laterality: Right;  AXILLARY BLAOCK    There were no vitals filed for this visit.  Subjective Assessment - 09/25/19 1326    Subjective  S: I'm ready to go back to work.    Currently in Pain?  Yes    Pain Score  5     Pain Location  Shoulder    Pain Orientation  Right    Pain Descriptors / Indicators  Aching    Pain Type  Acute pain         [facility] OT Assessment - 09/25/19 1327      Assessment   Medical Diagnosis  Right shoulder arthroscopy      Precautions   Precautions  Shoulder    Type of Shoulder Precautions  P/ROM, AA/ROM, A/ROM               OT Treatments/Exercises (OP) - 09/25/19 1327      Exercises   Exercises  Shoulder      Shoulder Exercises: Supine   Protraction  PROM;10 reps    Horizontal ABduction  PROM;10 reps    External Rotation  PROM;10  reps    Internal Rotation  PROM;10 reps    Flexion  PROM;10 reps    ABduction  PROM;10 reps      Shoulder Exercises: Standing   Protraction  AROM;10 reps    Horizontal ABduction  AAROM;10 reps    External Rotation  AROM;10 reps    Internal Rotation  AROM;10 reps    Flexion  AROM;10 reps    ABduction  AROM;10 reps;Limitations    ABduction Limitations  to shoulder level      Shoulder Exercises: ROM/Strengthening   Proximal Shoulder Strengthening, Seated  10X A/ROM no rest breaks      Manual Therapy   Manual Therapy  Myofascial release    Manual therapy comments  manual therapy completed prior to exercises.     Myofascial Release  Myofascial release and manual stretching completed to right upper arm, trapezius, and scapularis region to decrease fascial restrictions and increase ROM               OT Short Term Goals - 09/20/19 1225      OT SHORT TERM GOAL #1   Title  Patient will be educated and independent with HEP in order to faciliate progress in therapy and increase ability to return to using her RUE for daily tasks.    Time  3    Period  Weeks    Status  On-going    Target Date  10/10/19      OT SHORT TERM GOAL #2   Title  Patient will increase RUE strength to 4-/5 to increase ability to complete light weight lifting activities with less difficulty.    Time  3    Period  Weeks    Status  On-going      OT SHORT TERM GOAL #3   Title  Patient will decrease fascial restriction in the RUE to min amount in order to increase functional mobility need to complete reaching tasks.    Time  3    Period  Weeks    Status  On-going      OT SHORT TERM GOAL #4   Title  Patient will report a decrease in pain level of approximately 4/10 when  completing basic daily tasks.    Time  3    Period  Weeks    Status  On-going        OT Long Term Goals - 09/20/19 1130      OT LONG TERM GOAL #1   Title  Patient will reach her highest level  of independence while using her RUE for  all daily and work related tasks 75% or more of the time.    Time  6    Period  Weeks    Status  On-going      OT LONG TERM GOAL #2   Title  Patient will increase RUE shoulder strength to 4+/5 in order to complete normal lifting tasks with less difficulty.    Time  6    Period  Weeks    Status  On-going      OT LONG TERM GOAL #3   Title  Patient will increase her RUE A/ROM to WNL in order to provide assist for overhead tasks.    Time  6    Period  Weeks    Status  On-going      OT LONG TERM GOAL #4   Title  Patient will report a decrease in pain of approximately 2/10 or less when using her RUE for normal daily tasks.    Time  6    Period  Weeks    Status  On-going      OT LONG TERM GOAL #5   Title  Patient will decrease fascial restrictions to trace amount in order to increase functional mobility needed to complete reach tasks.    Time  6    Period  Weeks    Status  On-going            Plan - 09/25/19 1610    Clinical Impression Statement  A: Focused a lot during beginning of session on manual techniques to decrease fascial restrictions in the right upper arm and upper trapezius region. Completed A/ROM standing while modifying the degree of movement to focus on decreasing shoulder elevation. patient demonstrating improvement with this task from previous session. VC for form and technique were provided.    Body Structure / Function / Physical Skills  ADL;UE functional use;Fascial restriction;Pain;ROM;IADL;Strength    Plan  P: Add wall wash and pulleys. Provide A/ROM for HEP while keeping horizontal abduction AA/ROM unless able to demonstrate next session.    Consulted and Agree with Plan of Care  Patient       Patient will benefit from skilled therapeutic intervention in order to improve the following deficits and impairments:   Body Structure / Function / Physical Skills: ADL, UE functional use, Fascial restriction, Pain, ROM, IADL, Strength       Visit  Diagnosis: Acute pain of right shoulder  Stiffness of right shoulder, not elsewhere classified  Other symptoms and signs involving the musculoskeletal system    Problem List Patient Active Problem List   Diagnosis Date Noted  . Abnormal stress test 03/09/2019  . Coronary artery calcification 02/04/2019  . Exertional dyspnea 02/04/2019  . Palpitations 02/04/2019  . CAD (coronary atherosclerotic disease) 01/27/2019  . Aortic atherosclerosis ([place]) 01/27/2019  . Migraine 09/06/2018  . Carpal instability of right wrist 06/20/2018  . S/P patent foramen ovale closure 02/28/2015  . Chronic shoulder pain 07/24/2014  . Hyperlipidemia, unspecified 08/16/2013  . Hot flashes, menopausal 08/14/2013  . Smoker 08/07/2013  . 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 GERD (gastroesophageal reflux disease) 09/22/2012   Ailene Ravel, OTR/L,CBIS  [phone]  09/25/2019, 4:13 PM  [place] [address] [place], [place], [address] Phone: [phone]   Fax:  [phone]  Name: Jessica Shaw MRN: [id number] Date of Birth: 1966-03-07

## 2019-09-26 ENCOUNTER — Ambulatory Visit (HOSPITAL_COMMUNITY): Payer: BLUE CROSS/BLUE SHIELD | Admitting: Occupational Therapy

## 2019-09-26 DIAGNOSIS — R600 Localized edema: Secondary | ICD-10-CM | POA: Diagnosis not present

## 2019-09-26 DIAGNOSIS — Z86718 Personal history of other venous thrombosis and embolism: Secondary | ICD-10-CM | POA: Diagnosis not present

## 2019-09-26 DIAGNOSIS — Z4889 Encounter for other specified surgical aftercare: Secondary | ICD-10-CM | POA: Diagnosis not present

## 2019-09-27 DIAGNOSIS — Z86718 Personal history of other venous thrombosis and embolism: Secondary | ICD-10-CM | POA: Diagnosis not present

## 2019-09-27 DIAGNOSIS — Z4889 Encounter for other specified surgical aftercare: Secondary | ICD-10-CM | POA: Diagnosis not present

## 2019-09-27 DIAGNOSIS — R600 Localized edema: Secondary | ICD-10-CM | POA: Diagnosis not present

## 2019-09-28 ENCOUNTER — Ambulatory Visit (HOSPITAL_COMMUNITY): Payer: BLUE CROSS/BLUE SHIELD | Admitting: Specialist

## 2019-09-28 ENCOUNTER — Ambulatory Visit (HOSPITAL_COMMUNITY): Payer: BLUE CROSS/BLUE SHIELD | Admitting: Occupational Therapy

## 2019-09-28 ENCOUNTER — Encounter (HOSPITAL_COMMUNITY): Payer: Self-pay | Admitting: Specialist

## 2019-09-28 ENCOUNTER — Other Ambulatory Visit: Payer: Self-pay

## 2019-09-28 DIAGNOSIS — M25611 Stiffness of right shoulder, not elsewhere classified: Secondary | ICD-10-CM

## 2019-09-28 DIAGNOSIS — Z86718 Personal history of other venous thrombosis and embolism: Secondary | ICD-10-CM | POA: Diagnosis not present

## 2019-09-28 DIAGNOSIS — R29898 Other symptoms and signs involving the musculoskeletal system: Secondary | ICD-10-CM | POA: Diagnosis not present

## 2019-09-28 DIAGNOSIS — Z4889 Encounter for other specified surgical aftercare: Secondary | ICD-10-CM | POA: Diagnosis not present

## 2019-09-28 DIAGNOSIS — M25511 Pain in right shoulder: Secondary | ICD-10-CM | POA: Diagnosis not present

## 2019-09-28 DIAGNOSIS — R600 Localized edema: Secondary | ICD-10-CM | POA: Diagnosis not present

## 2019-09-28 NOTE — Patient Instructions (Signed)
Complete each exercise lying down or standing.  10 times each, 2 times per day.  1) ROM: Abduction (Standing)   Bring arms straight out from sides and raise as high as possible without pain. Repeat ____ times per set. Do ____ sets per session. Do ____ sessions per day.  http://orth.exer.us/910   Copyright  VHI. All rights reserved.   2) Extension (Active) ROM: Extension (Standing)   Bring arms straight back as far as possible without pain. Repeat ____ times per set. Do ____ sets per session. Do ____ sessions per day.  http://orth.exer.us/916   Copyright  VHI. All rights reserved.   3) ROM: External / Internal Rotation - in Abduction (Standing)   EXTERNAL ROTATION: Sitting (Active)    Sit, right arm bent to 90, elbow against side, hand forward. Rotate forearm outward, keeping elbow against body. Use ___ lbs. Complete ___ sets of ___ repetitions. Perform ___ sessions per day.  Copyright  VHI. All rights reserved.   http://orth.exer.us/912   Copyright  VHI. All rights reserved.    4) Flexors Stretch (Active)   Stand, arms straight at sides. Bring arms straight forward and upward as high as possible without pain. Hold ___ seconds. Repeat ___ times per session. Do ___ sessions per day.  Copyright  VHI. All rights reserved.   5) Scapular Retraction (Standing)   With arms at sides, pinch shoulder blades together. Repeat ____ times per set. Do ____ sets per session. Do ____ sessions per day.  http://orth.exer.us/944   Copyright  VHI. All rights reserved.

## 2019-09-28 NOTE — Therapy (Signed)
[place], [place], [address] Phone: [phone]   Fax:  [phone]  Occupational Therapy Treatment  Patient Details  Name: Jessica Shaw MRN: [id number] Date of Birth: February 08, 1966 Referring Provider (OT): Justice Britain, MD   Encounter Date: 09/28/2019  OT End of Session - 09/28/19 1416    Visit Number  5    Number of Visits  13    Date for OT Re-Evaluation  10/31/19    Authorization Type  BC[employer]    Authorization Time Period  30 visit limit. 2 used at [facility]. $25 co-pay    Authorization - Visit Number  5    Authorization - Number of Visits  28    Progress Note Due on Visit  10    OT Start Time  1118    OT Stop Time  1200    OT Time Calculation (min)  42 min    Activity Tolerance  Patient tolerated treatment well    Behavior During Therapy  WFL for tasks assessed/performed       Past Medical History:  Diagnosis Date  . Complication of anesthesia   . Coronary artery disease    PFO closure 2006  . Embolism - blood clot    arm   left   . GERD (gastroesophageal reflux disease)   . HLD (hyperlipidemia)   . Migraine   . PONV (postoperative nausea and vomiting)   . Sleep apnea    mild OSA, no CPAP  . Smoker   . Spinal cord stimulator status 2006   in lower back, pt patient  . Stroke [facility])    TIA, no deficits    Past Surgical History:  Procedure Laterality Date  . CARDIAC CATHETERIZATION    . CHOLECYSTECTOMY    . COLONOSCOPY  08/04/2005   [id number] external hemorrhoids and anal papilla, otherwise normal  . COLONOSCOPY N/A 10/07/2012   Procedure: COLONOSCOPY;  Surgeon: Daneil Dolin, MD;  [facility];  Service: Endoscopy;  Laterality: N/A;  10:30  . COLONOSCOPY N/A 10/13/2016   Procedure: COLONOSCOPY;  Surgeon: Daneil Dolin, MD;  [facility];  Service: Endoscopy;  Laterality: N/A;  9:00am  . ESOPHAGOGASTRODUODENOSCOPY  04/20/2003   [id number] changes of reflux  esophagitis/Limited gastroesophageal junction, otherwise normal  . ESOPHAGOGASTRODUODENOSCOPY N/A 10/07/2012   Procedure: ESOPHAGOGASTRODUODENOSCOPY (EGD);  Surgeon: Daneil Dolin, MD;  [facility];  Service: Endoscopy;  Laterality: N/A;  . INTRAVASCULAR PRESSURE WIRE/FFR STUDY N/A 03/14/2019   Procedure: INTRAVASCULAR PRESSURE WIRE/FFR STUDY;  Surgeon: Nigel Mormon, MD;  Location: [place] [facility];  Service: Cardiovascular;  Laterality: N/A;  . LEFT HEART CATH AND CORONARY ANGIOGRAPHY N/A 03/14/2019   Procedure: LEFT HEART CATH AND CORONARY ANGIOGRAPHY;  Surgeon: Nigel Mormon, MD;  Location: [place] [facility];  Service: Cardiovascular;  Laterality: N/A;  . PATENT FORAMEN OVALE CLOSURE    . POLYPECTOMY  10/13/2016   Procedure: POLYPECTOMY;  Surgeon: Daneil Dolin, MD;  [facility];  Service: Endoscopy;;  colon  . SHOULDER ARTHROSCOPY     left  . SPINAL CORD STIMULATOR IMPLANT    . TUBAL LIGATION    . WRIST ARTHROSCOPY WITH DEBRIDEMENT Right 06/23/2018   Procedure: RIGHT ARTHROSCOPY WRIST DEBRIDEMENT LUNO TRIQUETRAL TEAR SHRINKAGE;  Surgeon: Daryll Brod, MD;  Location: [place];  Service: Orthopedics;  Laterality: Right;  AXILLARY BLAOCK    There were no vitals filed for this visit.  Subjective Assessment - 09/28/19 1142    Subjective   S:  It feels like an aching pain and then after that pain it feels weaker.    Currently in Pain?  Yes    Pain Score  7     Pain Location  Shoulder    Pain Orientation  Right    Pain Descriptors / Indicators  Aching    Pain Type  Acute pain         [facility] OT Assessment - 09/28/19 0001      Assessment   Medical Diagnosis  Right shoulder arthroscopy    Referring Provider (OT)  Justice Britain, MD      Precautions   Precautions  Shoulder    Type of Shoulder Precautions  P/ROM, AA/ROM, A/ROM               OT Treatments/Exercises (OP) - 09/28/19 0001      Shoulder Exercises: Supine    Protraction  PROM;10 reps;AROM;12 reps    Horizontal ABduction  PROM;AROM;10 reps;12 reps    External Rotation  PROM;10 reps;AROM;12 reps    Internal Rotation  PROM;10 reps;AROM;12 reps    Flexion  PROM;10 reps;AROM;12 reps    ABduction  PROM;10 reps;AAROM;12 reps    ABduction Limitations  therapist unweighted arm      Shoulder Exercises: Seated   Elevation  AROM;10 reps    Extension  AROM;10 reps    Row  AROM;10 reps      Shoulder Exercises: ROM/Strengthening   Wall Wash  1'; wrote name on wall 5 times for sustained actiivty tolerance improvement with functional tasks at shoulder height    Proximal Shoulder Strengthening, Seated  10X A/ROM no rest breaks      Manual Therapy   Manual Therapy  Myofascial release    Manual therapy comments  manual therapy completed prior to exercises.     Myofascial Release  Myofascial release and manual stretching completed to right upper arm, trapezius, and scapularis region to decrease fascial restrictions and increase ROM             OT Education - 09/28/19 1416    Education Details  educated patient on A/ROM in supine and standing.    Person(s) Educated  Patient    Methods  Explanation;Demonstration;Verbal cues;Handout    Comprehension  Verbalized understanding;Returned demonstration       OT Short Term Goals - 09/20/19 1225      OT SHORT TERM GOAL #1   Title  Patient will be educated and independent with HEP in order to faciliate progress in therapy and increase ability to return to using her RUE for daily tasks.    Time  3    Period  Weeks    Status  On-going    Target Date  10/10/19      OT SHORT TERM GOAL #2   Title  Patient will increase RUE strength to 4-/5 to increase ability to complete light weight lifting activities with less difficulty.    Time  3    Period  Weeks    Status  On-going      OT SHORT TERM GOAL #3   Title  Patient will decrease fascial restriction in the RUE to min amount in order to increase functional  mobility need to complete reaching tasks.    Time  3    Period  Weeks    Status  On-going      OT SHORT TERM GOAL #4   Title  Patient will report a decrease in pain level of approximately 4/10 when  completing basic daily tasks.    Time  3    Period  Weeks    Status  On-going        OT Long Term Goals - 09/20/19 1130      OT LONG TERM GOAL #1   Title  Patient will reach her highest level of independence while using her RUE for all daily and work related tasks 75% or more of the time.    Time  6    Period  Weeks    Status  On-going      OT LONG TERM GOAL #2   Title  Patient will increase RUE shoulder strength to 4+/5 in order to complete normal lifting tasks with less difficulty.    Time  6    Period  Weeks    Status  On-going      OT LONG TERM GOAL #3   Title  Patient will increase her RUE A/ROM to WNL in order to provide assist for overhead tasks.    Time  6    Period  Weeks    Status  On-going      OT LONG TERM GOAL #4   Title  Patient will report a decrease in pain of approximately 2/10 or less when using her RUE for normal daily tasks.    Time  6    Period  Weeks    Status  On-going      OT LONG TERM GOAL #5   Title  Patient will decrease fascial restrictions to trace amount in order to increase functional mobility needed to complete reach tasks.    Time  6    Period  Weeks    Status  On-going            Plan - 09/28/19 1417    Clinical Impression Statement  A:  assured patient that she is progressing well and some pain and stiffness is a normal part of rehab process, patient with increased fascial restrictions in anterior upper arm that responded well to MFR technique.  added A/ROM to HEP.  patient required unweighting of arm during abduction due to difficulty completing.    Body Structure / Function / Physical Skills  ADL;UE functional use;Fascial restriction;Pain;ROM;IADL;Strength    Plan  P:  follow up on hep added this date.  add pulleys, ball circles,  ball on wall for improved proximal shoulder stability needed for improved functional use of arm.    OT Home Exercise Plan  Eval: AA/ROM shoulder supine, 09/28/19 - a/rom in seated or supine.       Patient will benefit from skilled therapeutic intervention in order to improve the following deficits and impairments:   Body Structure / Function / Physical Skills: ADL, UE functional use, Fascial restriction, Pain, ROM, IADL, Strength       Visit Diagnosis: Acute pain of right shoulder  Stiffness of right shoulder, not elsewhere classified  Other symptoms and signs involving the musculoskeletal system    Problem List Patient Active Problem List   Diagnosis Date Noted  . Abnormal stress test 03/09/2019  . Coronary artery calcification 02/04/2019  . Exertional dyspnea 02/04/2019  . Palpitations 02/04/2019  . CAD (coronary atherosclerotic disease) 01/27/2019  . Aortic atherosclerosis ([place]) 01/27/2019  . Migraine 09/06/2018  . Carpal instability of right wrist 06/20/2018  . S/P patent foramen ovale closure 02/28/2015  . Chronic shoulder pain 07/24/2014  . Hyperlipidemia, unspecified  08/16/2013  . Hot flashes, menopausal 08/14/2013  . Smoker 08/07/2013  . 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 GERD (gastroesophageal reflux disease) 09/22/2012    Vangie Bicker, [place], OTR/L [phone]  09/28/2019, 2:22 PM  [place], [place], [address] Phone: [phone]   Fax:  [phone]  Name: Jessica Shaw MRN: [id number] Date of Birth: Oct 20, 1965

## 2019-09-29 DIAGNOSIS — Z4889 Encounter for other specified surgical aftercare: Secondary | ICD-10-CM | POA: Diagnosis not present

## 2019-09-29 DIAGNOSIS — Z86718 Personal history of other venous thrombosis and embolism: Secondary | ICD-10-CM | POA: Diagnosis not present

## 2019-09-29 DIAGNOSIS — R600 Localized edema: Secondary | ICD-10-CM | POA: Diagnosis not present

## 2019-09-30 DIAGNOSIS — Z4889 Encounter for other specified surgical aftercare: Secondary | ICD-10-CM | POA: Diagnosis not present

## 2019-09-30 DIAGNOSIS — Z86718 Personal history of other venous thrombosis and embolism: Secondary | ICD-10-CM | POA: Diagnosis not present

## 2019-09-30 DIAGNOSIS — R600 Localized edema: Secondary | ICD-10-CM | POA: Diagnosis not present

## 2019-10-01 DIAGNOSIS — Z86718 Personal history of other venous thrombosis and embolism: Secondary | ICD-10-CM | POA: Diagnosis not present

## 2019-10-01 DIAGNOSIS — R600 Localized edema: Secondary | ICD-10-CM | POA: Diagnosis not present

## 2019-10-01 DIAGNOSIS — Z4889 Encounter for other specified surgical aftercare: Secondary | ICD-10-CM | POA: Diagnosis not present

## 2019-10-02 ENCOUNTER — Other Ambulatory Visit: Payer: Self-pay

## 2019-10-02 ENCOUNTER — Encounter (HOSPITAL_COMMUNITY): Payer: Self-pay

## 2019-10-02 ENCOUNTER — Ambulatory Visit (HOSPITAL_COMMUNITY): Payer: BLUE CROSS/BLUE SHIELD

## 2019-10-02 DIAGNOSIS — M25511 Pain in right shoulder: Secondary | ICD-10-CM | POA: Diagnosis not present

## 2019-10-02 DIAGNOSIS — M25611 Stiffness of right shoulder, not elsewhere classified: Secondary | ICD-10-CM | POA: Diagnosis not present

## 2019-10-02 DIAGNOSIS — R29898 Other symptoms and signs involving the musculoskeletal system: Secondary | ICD-10-CM | POA: Diagnosis not present

## 2019-10-02 DIAGNOSIS — R600 Localized edema: Secondary | ICD-10-CM | POA: Diagnosis not present

## 2019-10-02 DIAGNOSIS — Z4889 Encounter for other specified surgical aftercare: Secondary | ICD-10-CM | POA: Diagnosis not present

## 2019-10-02 DIAGNOSIS — Z86718 Personal history of other venous thrombosis and embolism: Secondary | ICD-10-CM | POA: Diagnosis not present

## 2019-10-02 NOTE — Therapy (Signed)
[place], [place], [address] Phone: [phone]   Fax:  [phone]  Occupational Therapy Treatment  Patient Details  Name: Jessica Shaw MRN: [id number] Date of Birth: 1966/01/24 Referring Provider (OT): Justice Britain, MD   Encounter Date: 10/02/2019  OT End of Session - 10/02/19 1415    Visit Number  6    Number of Visits  13    Date for OT Re-Evaluation  10/31/19    Authorization Type  BC[employer]    Authorization Time Period  30 visit limit. 2 used at [facility]. $25 co-pay    Authorization - Visit Number  6    Authorization - Number of Visits  28    Progress Note Due on Visit  10    OT Start Time  1345    OT Stop Time  1423    OT Time Calculation (min)  38 min    Activity Tolerance  Patient tolerated treatment well    Behavior During Therapy  WFL for tasks assessed/performed       Past Medical History:  Diagnosis Date  . Complication of anesthesia   . Coronary artery disease    PFO closure 2006  . Embolism - blood clot    arm   left   . GERD (gastroesophageal reflux disease)   . HLD (hyperlipidemia)   . Migraine   . PONV (postoperative nausea and vomiting)   . Sleep apnea    mild OSA, no CPAP  . Smoker   . Spinal cord stimulator status 2006   in lower back, pt patient  . Stroke [facility])    TIA, no deficits    Past Surgical History:  Procedure Laterality Date  . CARDIAC CATHETERIZATION    . CHOLECYSTECTOMY    . COLONOSCOPY  08/04/2005   [id number] external hemorrhoids and anal papilla, otherwise normal  . COLONOSCOPY N/A 10/07/2012   Procedure: COLONOSCOPY;  Surgeon: Daneil Dolin, MD;  [facility];  Service: Endoscopy;  Laterality: N/A;  10:30  . COLONOSCOPY N/A 10/13/2016   Procedure: COLONOSCOPY;  Surgeon: Daneil Dolin, MD;  [facility];  Service: Endoscopy;  Laterality: N/A;  9:00am  . ESOPHAGOGASTRODUODENOSCOPY  04/20/2003   [id number] changes of reflux  esophagitis/Limited gastroesophageal junction, otherwise normal  . ESOPHAGOGASTRODUODENOSCOPY N/A 10/07/2012   Procedure: ESOPHAGOGASTRODUODENOSCOPY (EGD);  Surgeon: Daneil Dolin, MD;  [facility];  Service: Endoscopy;  Laterality: N/A;  . INTRAVASCULAR PRESSURE WIRE/FFR STUDY N/A 03/14/2019   Procedure: INTRAVASCULAR PRESSURE WIRE/FFR STUDY;  Surgeon: Nigel Mormon, MD;  Location: [place] [facility];  Service: Cardiovascular;  Laterality: N/A;  . LEFT HEART CATH AND CORONARY ANGIOGRAPHY N/A 03/14/2019   Procedure: LEFT HEART CATH AND CORONARY ANGIOGRAPHY;  Surgeon: Nigel Mormon, MD;  Location: [place] [facility];  Service: Cardiovascular;  Laterality: N/A;  . PATENT FORAMEN OVALE CLOSURE    . POLYPECTOMY  10/13/2016   Procedure: POLYPECTOMY;  Surgeon: Daneil Dolin, MD;  [facility];  Service: Endoscopy;;  colon  . SHOULDER ARTHROSCOPY     left  . SPINAL CORD STIMULATOR IMPLANT    . TUBAL LIGATION    . WRIST ARTHROSCOPY WITH DEBRIDEMENT Right 06/23/2018   Procedure: RIGHT ARTHROSCOPY WRIST DEBRIDEMENT LUNO TRIQUETRAL TEAR SHRINKAGE;  Surgeon: Daryll Brod, MD;  Location: [place];  Service: Orthopedics;  Laterality: Right;  AXILLARY BLAOCK    There were no vitals filed for this visit.  Subjective Assessment - 10/02/19 1409    Subjective   S: Sleeping is still difficult.    Currently in Pain?  Yes    Pain Score  4     Pain Location  Shoulder    Pain Orientation  Right    Pain Descriptors / Indicators  Aching    Pain Type  Acute pain    Pain Radiating Towards  up in neck    Pain Onset  In the past 7 days    Pain Frequency  Constant    Aggravating Factors   increasing movement    Pain Relieving Factors  pain medication    Effect of Pain on Daily Activities  moderate effect    Multiple Pain Sites  No         [facility] OT Assessment - 10/02/19 1410      Assessment   Medical Diagnosis  Right shoulder arthroscopy      Precautions    Precautions  Shoulder    Type of Shoulder Precautions  P/ROM, AA/ROM, A/ROM               OT Treatments/Exercises (OP) - 10/02/19 1412      Exercises   Exercises  Shoulder      Shoulder Exercises: Supine   Protraction  PROM;10 reps    Horizontal ABduction  PROM;10 reps    External Rotation  PROM;10 reps    Internal Rotation  PROM;10 reps    Flexion  PROM;10 reps    ABduction  PROM;10 reps      Shoulder Exercises: Pulleys   Flexion  1 minute   standing     Shoulder Exercises: Therapy Ball   Right/Left  5 reps      Shoulder Exercises: ROM/Strengthening   UBE (Upper Arm Bike)  Level 1 2' forward 2' reverse   pace: 5.0-6.0   Ball on Wall  1' flexion 1' abduction      Manual Therapy   Manual Therapy  Myofascial release    Manual therapy comments  manual therapy completed prior to exercises.     Myofascial Release  Myofascial release and manual stretching completed to right upper arm, trapezius, and scapularis region to decrease fascial restrictions and increase ROM               OT Short Term Goals - 09/20/19 1225      OT SHORT TERM GOAL #1   Title  Patient will be educated and independent with HEP in order to faciliate progress in therapy and increase ability to return to using her RUE for daily tasks.    Time  3    Period  Weeks    Status  On-going    Target Date  10/10/19      OT SHORT TERM GOAL #2   Title  Patient will increase RUE strength to 4-/5 to increase ability to complete light weight lifting activities with less difficulty.    Time  3    Period  Weeks    Status  On-going      OT SHORT TERM GOAL #3   Title  Patient will decrease fascial restriction in the RUE to min amount in order to increase functional mobility need to complete reaching tasks.    Time  3    Period  Weeks    Status  On-going      OT SHORT TERM GOAL #4   Title  Patient will report a decrease in pain level of approximately  4/10 when  completing basic daily tasks.     Time  3    Period  Weeks    Status  On-going        OT Long Term Goals - 09/20/19 1130      OT LONG TERM GOAL #1   Title  Patient will reach her highest level of independence while using her RUE for all daily and work related tasks 75% or more of the time.    Time  6    Period  Weeks    Status  On-going      OT LONG TERM GOAL #2   Title  Patient will increase RUE shoulder strength to 4+/5 in order to complete normal lifting tasks with less difficulty.    Time  6    Period  Weeks    Status  On-going      OT LONG TERM GOAL #3   Title  Patient will increase her RUE A/ROM to WNL in order to provide assist for overhead tasks.    Time  6    Period  Weeks    Status  On-going      OT LONG TERM GOAL #4   Title  Patient will report a decrease in pain of approximately 2/10 or less when using her RUE for normal daily tasks.    Time  6    Period  Weeks    Status  On-going      OT LONG TERM GOAL #5   Title  Patient will decrease fascial restrictions to trace amount in order to increase functional mobility needed to complete reach tasks.    Time  6    Period  Weeks    Status  On-going            Plan - 10/02/19 1449    Clinical Impression Statement  A: Patient able to achieve full P/ROM for all motions except flexion and external rotation. Added therapy ball circles to work on internal rotation. Added UBE bike to shoulder endurance. VC for form and techniques as needed. Manual techniques completed to address fascial restrictions.    Body Structure / Function / Physical Skills  ADL;UE functional use;Fascial restriction;Pain;ROM;IADL;Strength    Plan  P: therapy ball strengthening and overhead lacing.    Consulted and Agree with Plan of Care  Patient       Patient will benefit from skilled therapeutic intervention in order to improve the following deficits and impairments:   Body Structure / Function / Physical Skills: ADL, UE functional use, Fascial restriction, Pain, ROM,  IADL, Strength       Visit Diagnosis: Stiffness of right shoulder, not elsewhere classified  Acute pain of right shoulder  Other symptoms and signs involving the musculoskeletal system    Problem List Patient Active Problem List   Diagnosis Date Noted  . Abnormal stress test 03/09/2019  . Coronary artery calcification 02/04/2019  . Exertional dyspnea 02/04/2019  . Palpitations 02/04/2019  . CAD (coronary atherosclerotic disease) 01/27/2019  . Aortic atherosclerosis ([place]) 01/27/2019  . Migraine 09/06/2018  . Carpal instability of right wrist 06/20/2018  . S/P patent foramen ovale closure 02/28/2015  . Chronic shoulder pain 07/24/2014  . Hyperlipidemia, unspecified 08/16/2013  . Hot flashes, menopausal 08/14/2013  . Smoker 08/07/2013  . 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 GERD (gastroesophageal reflux disease) 09/22/2012   Ailene Ravel, OTR/L,CBIS  [phone]  10/02/2019, 2:54 PM  [place],  [place], [address] Phone: [phone]   Fax:  [phone]  Name: Jessica CREMEANS MRN: [id number] Date of Birth: 13-Oct-1965

## 2019-10-03 DIAGNOSIS — R600 Localized edema: Secondary | ICD-10-CM | POA: Diagnosis not present

## 2019-10-03 DIAGNOSIS — Z86718 Personal history of other venous thrombosis and embolism: Secondary | ICD-10-CM | POA: Diagnosis not present

## 2019-10-03 DIAGNOSIS — Z4889 Encounter for other specified surgical aftercare: Secondary | ICD-10-CM | POA: Diagnosis not present

## 2019-10-04 ENCOUNTER — Ambulatory Visit (HOSPITAL_COMMUNITY): Payer: BLUE CROSS/BLUE SHIELD

## 2019-10-04 ENCOUNTER — Encounter (HOSPITAL_COMMUNITY): Payer: Self-pay

## 2019-10-04 ENCOUNTER — Other Ambulatory Visit: Payer: Self-pay

## 2019-10-04 DIAGNOSIS — Z86718 Personal history of other venous thrombosis and embolism: Secondary | ICD-10-CM | POA: Diagnosis not present

## 2019-10-04 DIAGNOSIS — R600 Localized edema: Secondary | ICD-10-CM | POA: Diagnosis not present

## 2019-10-04 DIAGNOSIS — M25511 Pain in right shoulder: Secondary | ICD-10-CM

## 2019-10-04 DIAGNOSIS — M25611 Stiffness of right shoulder, not elsewhere classified: Secondary | ICD-10-CM | POA: Diagnosis not present

## 2019-10-04 DIAGNOSIS — R29898 Other symptoms and signs involving the musculoskeletal system: Secondary | ICD-10-CM | POA: Diagnosis not present

## 2019-10-04 DIAGNOSIS — Z4889 Encounter for other specified surgical aftercare: Secondary | ICD-10-CM | POA: Diagnosis not present

## 2019-10-04 NOTE — Therapy (Signed)
[place], [place], [address] Phone: [phone]   Fax:  [phone]  Occupational Therapy Treatment  Patient Details  Name: Jessica Shaw MRN: [id number] Date of Birth: [birth year] Referring Provider (OT): Justice Britain, MD   Encounter Date: 10/04/2019  OT End of Session - 10/04/19 0859    Visit Number  7    Number of Visits  13    Date for OT Re-Evaluation  10/31/19    Authorization Type  BCBS    Authorization Time Period  30 visit limit. 2 used at [facility]. $25 co-pay    Authorization - Visit Number  7    Authorization - Number of Visits  28    Progress Note Due on Visit  10    OT Start Time  [phone]    OT Stop Time  0855    OT Time Calculation (min)  38 min    Activity Tolerance  Patient tolerated treatment well    Behavior During Therapy  [facility] for tasks assessed/performed       Past Medical History:  Diagnosis Date  . Complication of anesthesia   . Coronary artery disease    PFO closure 2006  . Embolism - blood clot    arm   left   . GERD (gastroesophageal reflux disease)   . HLD (hyperlipidemia)   . Migraine   . PONV (postoperative nausea and vomiting)   . Sleep apnea    mild OSA, no CPAP  . Smoker   . Spinal cord stimulator status 2006   in lower back, pt patient  . Stroke [facility])    TIA, no deficits    Past Surgical History:  Procedure Laterality Date  . CARDIAC CATHETERIZATION    . CHOLECYSTECTOMY    . COLONOSCOPY  08/04/2005   [id number] external hemorrhoids and anal papilla, otherwise normal  . COLONOSCOPY N/A 10/07/2012   Procedure: COLONOSCOPY;  Surgeon: Daneil Dolin, MD;  [facility];  Service: Endoscopy;  Laterality: N/A;  10:30  . COLONOSCOPY N/A 10/13/2016   Procedure: COLONOSCOPY;  Surgeon: Daneil Dolin, MD;  [facility];  Service: Endoscopy;  Laterality: N/A;  9:00am  . ESOPHAGOGASTRODUODENOSCOPY  04/20/2003   [id number] changes of reflux  esophagitis/Limited gastroesophageal junction, otherwise normal  . ESOPHAGOGASTRODUODENOSCOPY N/A 10/07/2012   Procedure: ESOPHAGOGASTRODUODENOSCOPY (EGD);  Surgeon: Daneil Dolin, MD;  [facility];  Service: Endoscopy;  Laterality: N/A;  . INTRAVASCULAR PRESSURE WIRE/FFR STUDY N/A 03/14/2019   Procedure: INTRAVASCULAR PRESSURE WIRE/FFR STUDY;  Surgeon: Nigel Mormon, MD;  Location: [place] [facility];  Service: Cardiovascular;  Laterality: N/A;  . LEFT HEART CATH AND CORONARY ANGIOGRAPHY N/A 03/14/2019   Procedure: LEFT HEART CATH AND CORONARY ANGIOGRAPHY;  Surgeon: Nigel Mormon, MD;  Location: [place] CV LAB;  Service: Cardiovascular;  Laterality: N/A;  . PATENT FORAMEN OVALE CLOSURE    . POLYPECTOMY  10/13/2016   Procedure: POLYPECTOMY;  Surgeon: Daneil Dolin, MD;  [facility];  Service: Endoscopy;;  colon  . SHOULDER ARTHROSCOPY     left  . SPINAL CORD STIMULATOR IMPLANT    . TUBAL LIGATION    . WRIST ARTHROSCOPY WITH DEBRIDEMENT Right 06/23/2018   Procedure: RIGHT ARTHROSCOPY WRIST DEBRIDEMENT LUNO TRIQUETRAL TEAR SHRINKAGE;  Surgeon: Daryll Brod, MD;  Location: [place];  Service: Orthopedics;  Laterality: Right;  AXILLARY BLAOCK    There were no vitals filed for this visit.  Subjective Assessment - 10/04/19 0835    Subjective   S: Last night was a rough night.    Currently in Pain?  Yes    Pain Score  6     Pain Location  Shoulder    Pain Orientation  Right    Pain Descriptors / Indicators  Aching;Sore    Pain Type  Acute pain         [facility] OT Assessment - 10/04/19 0844      Assessment   Medical Diagnosis  Right shoulder arthroscopy      Precautions   Precautions  Shoulder    Type of Shoulder Precautions  P/ROM, AA/ROM, A/ROM               OT Treatments/Exercises (OP) - 10/04/19 0841      Exercises   Exercises  Shoulder      Shoulder Exercises: Supine   Protraction  PROM;10 reps    Horizontal  ABduction  PROM;10 reps;AROM;5 reps    Horizontal ABduction Limitations  adjusted range of movement for comfort/pain    External Rotation  PROM;10 reps    Internal Rotation  PROM;10 reps    Flexion  PROM;10 reps;AROM;5 reps    Flexion Limitations  adjusted range of movement for pain/comfort    ABduction  PROM;10 reps      Shoulder Exercises: Standing   External Rotation  AROM;10 reps    Internal Rotation  AROM;10 reps    ABduction  AROM;10 reps    Row  AROM;10 reps      Shoulder Exercises: ROM/Strengthening   UBE (Upper Arm Bike)  Level 1 2' forward 2' reverse   pace: 5.0-6.0   Other ROM/Strengthening Exercises  PVC pipe slide; 10X      Modalities   Modalities  Moist Heat      Moist Heat Therapy   Number Minutes Moist Heat  5 Minutes    Moist Heat Location  Shoulder      Manual Therapy   Manual Therapy  Myofascial release    Manual therapy comments  manual therapy completed prior to exercises.     Myofascial Release  Myofascial release and manual stretching completed to right upper arm, trapezius, and scapularis region to decrease fascial restrictions and increase ROM               OT Short Term Goals - 09/20/19 1225      OT SHORT TERM GOAL #1   Title  Patient will be educated and independent with HEP in order to faciliate progress in therapy and increase ability to return to using her RUE for daily tasks.    Time  3    Period  Weeks    Status  On-going    Target Date  10/10/19      OT SHORT TERM GOAL #2   Title  Patient will increase RUE strength to 4-/5 to increase ability to complete light weight lifting activities with less difficulty.    Time  3    Period  Weeks    Status  On-going      OT SHORT TERM GOAL #3   Title  Patient will decrease fascial restriction in the RUE to min amount in order to increase functional mobility need to complete reaching tasks.    Time  3    Period  Weeks    Status  On-going      OT SHORT TERM GOAL #4   Title  Patient  will report  a decrease in pain level of approximately 4/10 when  completing basic daily tasks.    Time  3    Period  Weeks    Status  On-going        OT Long Term Goals - 09/20/19 1130      OT LONG TERM GOAL #1   Title  Patient will reach her highest level of independence while using her RUE for all daily and work related tasks 75% or more of the time.    Time  6    Period  Weeks    Status  On-going      OT LONG TERM GOAL #2   Title  Patient will increase RUE shoulder strength to 4+/5 in order to complete normal lifting tasks with less difficulty.    Time  6    Period  Weeks    Status  On-going      OT LONG TERM GOAL #3   Title  Patient will increase her RUE A/ROM to WNL in order to provide assist for overhead tasks.    Time  6    Period  Weeks    Status  On-going      OT LONG TERM GOAL #4   Title  Patient will report a decrease in pain of approximately 2/10 or less when using her RUE for normal daily tasks.    Time  6    Period  Weeks    Status  On-going      OT LONG TERM GOAL #5   Title  Patient will decrease fascial restrictions to trace amount in order to increase functional mobility needed to complete reach tasks.    Time  6    Period  Weeks    Status  On-going            Plan - 10/04/19 0900    Clinical Impression Statement  A: Pt arrived to therapy with increased pain/soreness which was elevated during upper range of movement during passive and active ROM exercises. Complete joint mobilization during manual techniques and passive stretching. Applied heat prior to A/ROM for pain management. Modified degree of movement to stay within a pain free range as much as possible. Education provided on resting and not over doing activity with the RUE. Encouraged pain management techniques to keep pain level low. VC for form and technique were provided.    Body Structure / Function / Physical Skills  ADL;UE functional use;Fascial restriction;Pain;ROM;IADL;Strength    Plan   P: Follow up on pain level. Attempt overhead lacing if able. scapular strengthening with red band.    Consulted and Agree with Plan of Care  Patient       Patient will benefit from skilled therapeutic intervention in order to improve the following deficits and impairments:   Body Structure / Function / Physical Skills: ADL, UE functional use, Fascial restriction, Pain, ROM, IADL, Strength       Visit Diagnosis: Other symptoms and signs involving the musculoskeletal system  Acute pain of right shoulder  Stiffness of right shoulder, not elsewhere classified    Problem List Patient Active Problem List   Diagnosis Date Noted  . Abnormal stress test 03/09/2019  . Coronary artery calcification 02/04/2019  . Exertional dyspnea 02/04/2019  . Palpitations 02/04/2019  . CAD (coronary atherosclerotic disease) 01/27/2019  . Aortic atherosclerosis ([place]) 01/27/2019  . Migraine 09/06/2018  . Carpal instability of right wrist 06/20/2018  . S/P patent foramen ovale closure 02/28/2015  . Chronic shoulder  pain 07/24/2014  . Hyperlipidemia, unspecified 08/16/2013  . Hot flashes, menopausal 08/14/2013  . Smoker 08/07/2013  . 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 GERD (gastroesophageal reflux disease) 09/22/2012   Ailene Ravel, OTR/L,CBIS  [phone]  10/04/2019, 9:04 AM  [place] [address] [place], [place], [address] Phone: [phone]   Fax:  [phone]  Name: Jessica Shaw MRN: [id number] Date of Birth: 1965/08/27

## 2019-10-05 DIAGNOSIS — Z4889 Encounter for other specified surgical aftercare: Secondary | ICD-10-CM | POA: Diagnosis not present

## 2019-10-05 DIAGNOSIS — Z86718 Personal history of other venous thrombosis and embolism: Secondary | ICD-10-CM | POA: Diagnosis not present

## 2019-10-05 DIAGNOSIS — R600 Localized edema: Secondary | ICD-10-CM | POA: Diagnosis not present

## 2019-10-06 DIAGNOSIS — Z86718 Personal history of other venous thrombosis and embolism: Secondary | ICD-10-CM | POA: Diagnosis not present

## 2019-10-06 DIAGNOSIS — R600 Localized edema: Secondary | ICD-10-CM | POA: Diagnosis not present

## 2019-10-06 DIAGNOSIS — Z4889 Encounter for other specified surgical aftercare: Secondary | ICD-10-CM | POA: Diagnosis not present

## 2019-10-07 DIAGNOSIS — Z4889 Encounter for other specified surgical aftercare: Secondary | ICD-10-CM | POA: Diagnosis not present

## 2019-10-07 DIAGNOSIS — Z86718 Personal history of other venous thrombosis and embolism: Secondary | ICD-10-CM | POA: Diagnosis not present

## 2019-10-07 DIAGNOSIS — R600 Localized edema: Secondary | ICD-10-CM | POA: Diagnosis not present

## 2019-10-08 DIAGNOSIS — R600 Localized edema: Secondary | ICD-10-CM | POA: Diagnosis not present

## 2019-10-08 DIAGNOSIS — Z4889 Encounter for other specified surgical aftercare: Secondary | ICD-10-CM | POA: Diagnosis not present

## 2019-10-08 DIAGNOSIS — Z86718 Personal history of other venous thrombosis and embolism: Secondary | ICD-10-CM | POA: Diagnosis not present

## 2019-10-09 ENCOUNTER — Other Ambulatory Visit: Payer: Self-pay

## 2019-10-09 ENCOUNTER — Ambulatory Visit (HOSPITAL_COMMUNITY): Payer: BLUE CROSS/BLUE SHIELD

## 2019-10-09 DIAGNOSIS — M25511 Pain in right shoulder: Secondary | ICD-10-CM

## 2019-10-09 DIAGNOSIS — M25611 Stiffness of right shoulder, not elsewhere classified: Secondary | ICD-10-CM

## 2019-10-09 DIAGNOSIS — R29898 Other symptoms and signs involving the musculoskeletal system: Secondary | ICD-10-CM

## 2019-10-09 DIAGNOSIS — Z86718 Personal history of other venous thrombosis and embolism: Secondary | ICD-10-CM | POA: Diagnosis not present

## 2019-10-09 DIAGNOSIS — Z4889 Encounter for other specified surgical aftercare: Secondary | ICD-10-CM | POA: Diagnosis not present

## 2019-10-09 DIAGNOSIS — R600 Localized edema: Secondary | ICD-10-CM | POA: Diagnosis not present

## 2019-10-09 NOTE — Therapy (Signed)
[place], [place], [address] Phone: [phone]   Fax:  [phone]  Occupational Therapy Treatment  Patient Details  Name: Jessica Shaw MRN: [id number] Date of Birth: Apr 08, 1966 Referring Provider (OT): Justice Britain, MD   Encounter Date: 10/09/2019  OT End of Session - 10/09/19 1722    Visit Number  8    Number of Visits  13    Date for OT Re-Evaluation  10/31/19    Authorization Type  BCBS    Authorization Time Period  30 visit limit. 2 used at [facility]. $25 co-pay    Authorization - Visit Number  8    Authorization - Number of Visits  28    Progress Note Due on Visit  10    OT Start Time  1520    OT Stop Time  1553    OT Time Calculation (min)  33 min    Activity Tolerance  Patient tolerated treatment well    Behavior During Therapy  [facility] for tasks assessed/performed       Past Medical History:  Diagnosis Date  . Complication of anesthesia   . Coronary artery disease    PFO closure 2006  . Embolism - blood clot    arm   left   . GERD (gastroesophageal reflux disease)   . HLD (hyperlipidemia)   . Migraine   . PONV (postoperative nausea and vomiting)   . Sleep apnea    mild OSA, no CPAP  . Smoker   . Spinal cord stimulator status 2006   in lower back, pt patient  . Stroke [facility])    TIA, no deficits    Past Surgical History:  Procedure Laterality Date  . CARDIAC CATHETERIZATION    . CHOLECYSTECTOMY    . COLONOSCOPY  08/04/2005   [id number] external hemorrhoids and anal papilla, otherwise normal  . COLONOSCOPY N/A 10/07/2012   Procedure: COLONOSCOPY;  Surgeon: Daneil Dolin, MD;  [facility];  Service: Endoscopy;  Laterality: N/A;  10:30  . COLONOSCOPY N/A 10/13/2016   Procedure: COLONOSCOPY;  Surgeon: Daneil Dolin, MD;  [facility];  Service: Endoscopy;  Laterality: N/A;  9:00am  . ESOPHAGOGASTRODUODENOSCOPY  04/20/2003   [id number] changes of reflux  esophagitis/Limited gastroesophageal junction, otherwise normal  . ESOPHAGOGASTRODUODENOSCOPY N/A 10/07/2012   Procedure: ESOPHAGOGASTRODUODENOSCOPY (EGD);  Surgeon: Daneil Dolin, MD;  [facility];  Service: Endoscopy;  Laterality: N/A;  . INTRAVASCULAR PRESSURE WIRE/FFR STUDY N/A 03/14/2019   Procedure: INTRAVASCULAR PRESSURE WIRE/FFR STUDY;  Surgeon: Nigel Mormon, MD;  Location: [place] CV LAB;  Service: Cardiovascular;  Laterality: N/A;  . LEFT HEART CATH AND CORONARY ANGIOGRAPHY N/A 03/14/2019   Procedure: LEFT HEART CATH AND CORONARY ANGIOGRAPHY;  Surgeon: Nigel Mormon, MD;  Location: [place] CV LAB;  Service: Cardiovascular;  Laterality: N/A;  . PATENT FORAMEN OVALE CLOSURE    . POLYPECTOMY  10/13/2016   Procedure: POLYPECTOMY;  Surgeon: Daneil Dolin, MD;  [facility];  Service: Endoscopy;;  colon  . SHOULDER ARTHROSCOPY     left  . SPINAL CORD STIMULATOR IMPLANT    . TUBAL LIGATION    . WRIST ARTHROSCOPY WITH DEBRIDEMENT Right 06/23/2018   Procedure: RIGHT ARTHROSCOPY WRIST DEBRIDEMENT LUNO TRIQUETRAL TEAR SHRINKAGE;  Surgeon: Daryll Brod, MD;  Location: [facility];  Service: Orthopedics;  Laterality: Right;  AXILLARY BLAOCK    There were no vitals filed for this visit.  Subjective Assessment - 10/09/19 1706    Subjective   S: It's still so tight up here at my neck. It feels swollen.    Currently in Pain?  Yes    Pain Score  6     Pain Location  Shoulder    Pain Orientation  Right    Pain Descriptors / Indicators  Aching;Sore    Pain Type  Acute pain    Pain Radiating Towards  n/a    Pain Onset  1 to 4 weeks ago    Pain Frequency  Constant    Aggravating Factors   frequency of HEP. More than 2-3 times a day.    Pain Relieving Factors  pain medication    Effect of Pain on Daily Activities  moderate effect    Multiple Pain Sites  No         [facility] OT Assessment - 10/09/19 1708      Assessment   Medical Diagnosis   Right shoulder arthroscopy      Precautions   Precautions  Shoulder    Type of Shoulder Precautions  P/ROM, AA/ROM, A/ROM               OT Treatments/Exercises (OP) - 10/09/19 1717      Exercises   Exercises  Shoulder      Shoulder Exercises: Supine   Protraction  PROM;10 reps    Horizontal ABduction  PROM;10 reps    External Rotation  PROM;10 reps    Internal Rotation  PROM;10 reps    Flexion  PROM;AROM;10 reps    ABduction  PROM;10 reps      Manual Therapy   Manual Therapy  Myofascial release;Other (comment)    Manual therapy comments  manual therapy completed prior to exercises.     Myofascial Release  Myofascial release and manual stretching completed to right upper arm, trapezius, and scapularis region to decrease fascial restrictions and increase ROM    Other Manual Therapy  Strain counterstrain technique completed to right upper trapezius and sternocleidomastoid to decrease tone and pain.              OT Education - 10/09/19 1718    Education Details  Decrease frequency of HEP during the day. Continue with movement with less focus on stretching. Allow neck muscles to relax and rest. Use heat to help relax.    Person(s) Educated  Patient    Methods  Explanation    Comprehension  Verbalized understanding       OT Short Term Goals - 09/20/19 1225      OT SHORT TERM GOAL #1   Title  Patient will be educated and independent with HEP in order to faciliate progress in therapy and increase ability to return to using her RUE for daily tasks.    Time  3    Period  Weeks    Status  On-going    Target Date  10/10/19      OT SHORT TERM GOAL #2   Title  Patient will increase RUE strength to 4-/5 to increase ability to complete light weight lifting activities with less difficulty.    Time  3    Period  Weeks    Status  On-going      OT SHORT TERM GOAL #3   Title  Patient will decrease fascial restriction in the RUE to min amount in order to increase  functional mobility need to complete reaching tasks.    Time  3  Period  Weeks    Status  On-going      OT SHORT TERM GOAL #4   Title  Patient will report a decrease in pain level of approximately 4/10 when  completing basic daily tasks.    Time  3    Period  Weeks    Status  On-going        OT Long Term Goals - 09/20/19 1130      OT LONG TERM GOAL #1   Title  Patient will reach her highest level of independence while using her RUE for all daily and work related tasks 75% or more of the time.    Time  6    Period  Weeks    Status  On-going      OT LONG TERM GOAL #2   Title  Patient will increase RUE shoulder strength to 4+/5 in order to complete normal lifting tasks with less difficulty.    Time  6    Period  Weeks    Status  On-going      OT LONG TERM GOAL #3   Title  Patient will increase her RUE A/ROM to WNL in order to provide assist for overhead tasks.    Time  6    Period  Weeks    Status  On-going      OT LONG TERM GOAL #4   Title  Patient will report a decrease in pain of approximately 2/10 or less when using her RUE for normal daily tasks.    Time  6    Period  Weeks    Status  On-going      OT LONG TERM GOAL #5   Title  Patient will decrease fascial restrictions to trace amount in order to increase functional mobility needed to complete reach tasks.    Time  6    Period  Weeks    Status  On-going            Plan - 10/09/19 1722    Clinical Impression Statement  A: Pt presents today with continued complaints of increased muscle tightness, soreness, and pain in her right upper trapezius and lateral aspect of neck. Pt does voice that she is completing her HEP more than recommended as she is worried she will lose her ROM. Education was again provided on the importance of completing her HEP 2-3 times a day and the importance of allowing her muscles to recover. Pt appears to be overstretching and overdueing her HEP which in turn is causing her more muscle  tightness and pain than previously experienced. Teach back method used after education and recommendations were provided for homework prior to next therapy session.    Body Structure / Function / Physical Skills  ADL;UE functional use;Fascial restriction;Pain;ROM;IADL;Strength    Plan  P: If pain continues to be high start session with ES and heat. Attempt overhead lacing. Scapular strengthening with red band if muscle tension is less.    Consulted and Agree with Plan of Care  Patient       Patient will benefit from skilled therapeutic intervention in order to improve the following deficits and impairments:   Body Structure / Function / Physical Skills: ADL, UE functional use, Fascial restriction, Pain, ROM, IADL, Strength       Visit Diagnosis: Other symptoms and signs involving the musculoskeletal system  Acute pain of right shoulder  Stiffness of right shoulder, not elsewhere classified    Problem List Patient Active Problem List  Diagnosis Date Noted  . Abnormal stress test 03/09/2019  . Coronary artery calcification 02/04/2019  . Exertional dyspnea 02/04/2019  . Palpitations 02/04/2019  . CAD (coronary atherosclerotic disease) 01/27/2019  . Aortic atherosclerosis ([place]) 01/27/2019  . Migraine 09/06/2018  . Carpal instability of right wrist 06/20/2018  . S/P patent foramen ovale closure 02/28/2015  . Chronic shoulder pain 07/24/2014  . Hyperlipidemia, unspecified 08/16/2013  . Hot flashes, menopausal 08/14/2013  . Smoker 08/07/2013  . 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 GERD (gastroesophageal reflux disease) 09/22/2012   Ailene Ravel, OTR/L,CBIS  [phone]  10/09/2019, 5:53 PM  [facility] [place] [address] [place], [place], [address] Phone: [phone]   Fax:  [phone]  Name: Jessica Shaw MRN: [id number] Date of Birth: 1965-07-06

## 2019-10-10 DIAGNOSIS — R600 Localized edema: Secondary | ICD-10-CM | POA: Diagnosis not present

## 2019-10-10 DIAGNOSIS — Z4889 Encounter for other specified surgical aftercare: Secondary | ICD-10-CM | POA: Diagnosis not present

## 2019-10-10 DIAGNOSIS — Z86718 Personal history of other venous thrombosis and embolism: Secondary | ICD-10-CM | POA: Diagnosis not present

## 2019-10-11 ENCOUNTER — Ambulatory Visit (HOSPITAL_COMMUNITY): Payer: BLUE CROSS/BLUE SHIELD

## 2019-10-11 DIAGNOSIS — R600 Localized edema: Secondary | ICD-10-CM | POA: Diagnosis not present

## 2019-10-11 DIAGNOSIS — Z4889 Encounter for other specified surgical aftercare: Secondary | ICD-10-CM | POA: Diagnosis not present

## 2019-10-11 DIAGNOSIS — Z86718 Personal history of other venous thrombosis and embolism: Secondary | ICD-10-CM | POA: Diagnosis not present

## 2019-10-12 DIAGNOSIS — R600 Localized edema: Secondary | ICD-10-CM | POA: Diagnosis not present

## 2019-10-12 DIAGNOSIS — Z4889 Encounter for other specified surgical aftercare: Secondary | ICD-10-CM | POA: Diagnosis not present

## 2019-10-12 DIAGNOSIS — Z86718 Personal history of other venous thrombosis and embolism: Secondary | ICD-10-CM | POA: Diagnosis not present

## 2019-10-13 DIAGNOSIS — Z4889 Encounter for other specified surgical aftercare: Secondary | ICD-10-CM | POA: Diagnosis not present

## 2019-10-13 DIAGNOSIS — R600 Localized edema: Secondary | ICD-10-CM | POA: Diagnosis not present

## 2019-10-17 ENCOUNTER — Ambulatory Visit (HOSPITAL_COMMUNITY): Payer: BLUE CROSS/BLUE SHIELD

## 2019-10-18 ENCOUNTER — Encounter (HOSPITAL_COMMUNITY): Payer: Self-pay

## 2019-10-18 ENCOUNTER — Telehealth (HOSPITAL_COMMUNITY): Payer: Self-pay | Admitting: Occupational Therapy

## 2019-10-18 ENCOUNTER — Other Ambulatory Visit: Payer: Self-pay

## 2019-10-18 ENCOUNTER — Ambulatory Visit (HOSPITAL_COMMUNITY): Payer: BLUE CROSS/BLUE SHIELD | Attending: Orthopedic Surgery

## 2019-10-18 DIAGNOSIS — R29898 Other symptoms and signs involving the musculoskeletal system: Secondary | ICD-10-CM | POA: Insufficient documentation

## 2019-10-18 DIAGNOSIS — M25511 Pain in right shoulder: Secondary | ICD-10-CM | POA: Diagnosis not present

## 2019-10-18 DIAGNOSIS — M25611 Stiffness of right shoulder, not elsewhere classified: Secondary | ICD-10-CM | POA: Diagnosis not present

## 2019-10-18 NOTE — Patient Instructions (Signed)

## 2019-10-18 NOTE — Telephone Encounter (Signed)
pt cancelled appt on 5/7 because she has a conflict in her schedule

## 2019-10-18 NOTE — Therapy (Signed)
[place], [place], [address] Phone: [phone]   Fax:  [phone]  Occupational Therapy Treatment  Patient Details  Name: Jessica Shaw MRN: [id number] Date of Birth: May 06, 1966 Referring Provider (OT): Justice Britain, MD   Encounter Date: 10/18/2019  OT End of Session - 10/18/19 1143    Visit Number  9    Number of Visits  13    Date for OT Re-Evaluation  10/31/19    Authorization Type  BCBS    Authorization Time Period  30 visit limit. 2 used at [facility]. $25 co-pay    Authorization - Visit Number  9    Authorization - Number of Visits  28    Progress Note Due on Visit  10    OT Start Time  1115    OT Stop Time  1153    OT Time Calculation (min)  38 min    Activity Tolerance  Patient tolerated treatment well    Behavior During Therapy  WFL for tasks assessed/performed       Past Medical History:  Diagnosis Date  . Complication of anesthesia   . Coronary artery disease    PFO closure 2006  . Embolism - blood clot    arm   left   . GERD (gastroesophageal reflux disease)   . HLD (hyperlipidemia)   . Migraine   . PONV (postoperative nausea and vomiting)   . Sleep apnea    mild OSA, no CPAP  . Smoker   . Spinal cord stimulator status 2006   in lower back, pt patient  . Stroke [facility])    TIA, no deficits    Past Surgical History:  Procedure Laterality Date  . CARDIAC CATHETERIZATION    . CHOLECYSTECTOMY    . COLONOSCOPY  08/04/2005   [id number] external hemorrhoids and anal papilla, otherwise normal  . COLONOSCOPY N/A 10/07/2012   Procedure: COLONOSCOPY;  Surgeon: Daneil Dolin, MD;  [facility];  Service: Endoscopy;  Laterality: N/A;  10:30  . COLONOSCOPY N/A 10/13/2016   Procedure: COLONOSCOPY;  Surgeon: Daneil Dolin, MD;  [facility];  Service: Endoscopy;  Laterality: N/A;  9:00am  . ESOPHAGOGASTRODUODENOSCOPY  04/20/2003   [id number] changes of reflux  esophagitis/Limited gastroesophageal junction, otherwise normal  . ESOPHAGOGASTRODUODENOSCOPY N/A 10/07/2012   Procedure: ESOPHAGOGASTRODUODENOSCOPY (EGD);  Surgeon: Daneil Dolin, MD;  [facility];  Service: Endoscopy;  Laterality: N/A;  . INTRAVASCULAR PRESSURE WIRE/FFR STUDY N/A 03/14/2019   Procedure: INTRAVASCULAR PRESSURE WIRE/FFR STUDY;  Surgeon: Nigel Mormon, MD;  Location: [place] [facility];  Service: Cardiovascular;  Laterality: N/A;  . LEFT HEART CATH AND CORONARY ANGIOGRAPHY N/A 03/14/2019   Procedure: LEFT HEART CATH AND CORONARY ANGIOGRAPHY;  Surgeon: Nigel Mormon, MD;  Location: [place] [facility];  Service: Cardiovascular;  Laterality: N/A;  . PATENT FORAMEN OVALE CLOSURE    . POLYPECTOMY  10/13/2016   Procedure: POLYPECTOMY;  Surgeon: Daneil Dolin, MD;  [facility];  Service: Endoscopy;;  colon  . SHOULDER ARTHROSCOPY     left  . SPINAL CORD STIMULATOR IMPLANT    . TUBAL LIGATION    . WRIST ARTHROSCOPY WITH DEBRIDEMENT Right 06/23/2018   Procedure: RIGHT ARTHROSCOPY WRIST DEBRIDEMENT LUNO TRIQUETRAL TEAR SHRINKAGE;  Surgeon: Daryll Brod, MD;  Location: [place];  Service: Orthopedics;  Laterality: Right;  AXILLARY BLAOCK    There were no vitals filed for this visit.  Subjective Assessment - 10/18/19 1142    Subjective   S: It's still catching a little but maybe not as bad as it was.    Currently in Pain?  Yes    Pain Score  5     Pain Location  Shoulder    Pain Orientation  Right    Pain Descriptors / Indicators  Aching;Sore    Pain Type  Acute pain    Pain Radiating Towards  up to neck    Pain Onset  1 to 4 weeks ago    Pain Frequency  Constant    Aggravating Factors   movement and use    Pain Relieving Factors  pain medication, heat, ice    Effect of Pain on Daily Activities  moderate effect         [facility] OT Assessment - 10/18/19 1144      Assessment   Medical Diagnosis  Right shoulder arthroscopy       Precautions   Precautions  Shoulder    Type of Shoulder Precautions  P/ROM, AA/ROM, A/ROM               OT Treatments/Exercises (OP) - 10/18/19 1144      Exercises   Exercises  Shoulder      Shoulder Exercises: Supine   Protraction  PROM;10 reps    Horizontal ABduction  PROM;10 reps    External Rotation  PROM;10 reps    Internal Rotation  PROM;10 reps    Flexion  PROM;AROM;10 reps    ABduction  PROM;10 reps      Shoulder Exercises: Standing   Extension  Theraband;10 reps    Theraband Level (Shoulder Extension)  Level 2 (Red)    Row  Theraband;10 reps    Theraband Level (Shoulder Row)  Level 2 (Red)    Retraction  Theraband;10 reps    Theraband Level (Shoulder Retraction)  Level 2 (Red)      Shoulder Exercises: ROM/Strengthening   Ball on Wall  1' flexion 1' abduction      Functional Reaching Activities   High Level  Resistive clothespins used while seated on vertical pole for high level functional reaching task.       Manual Therapy   Manual Therapy  Myofascial release    Manual therapy comments  manual therapy completed prior to exercises.     Myofascial Release  Myofascial release and manual stretching completed to right upper arm, trapezius, and scapularis region to decrease fascial restrictions and increase ROM             OT Education - 10/18/19 1154    Education Details  red theraband scapular strengthening.    Person(s) Educated  Patient    Methods  Explanation;Demonstration;Verbal cues;Handout    Comprehension  Returned demonstration;Verbalized understanding       OT Short Term Goals - 09/20/19 1225      OT SHORT TERM GOAL #1   Title  Patient will be educated and independent with HEP in order to faciliate progress in therapy and increase ability to return to using her RUE for daily tasks.    Time  3    Period  Weeks    Status  On-going    Target Date  10/10/19      OT SHORT TERM GOAL #2   Title  Patient will increase RUE strength to 4-/5  to increase ability to complete light weight lifting activities with less difficulty.    Time  3  Period  Weeks    Status  On-going      OT SHORT TERM GOAL #3   Title  Patient will decrease fascial restriction in the RUE to min amount in order to increase functional mobility need to complete reaching tasks.    Time  3    Period  Weeks    Status  On-going      OT SHORT TERM GOAL #4   Title  Patient will report a decrease in pain level of approximately 4/10 when  completing basic daily tasks.    Time  3    Period  Weeks    Status  On-going        OT Long Term Goals - 09/20/19 1130      OT LONG TERM GOAL #1   Title  Patient will reach her highest level of independence while using her RUE for all daily and work related tasks 75% or more of the time.    Time  6    Period  Weeks    Status  On-going      OT LONG TERM GOAL #2   Title  Patient will increase RUE shoulder strength to 4+/5 in order to complete normal lifting tasks with less difficulty.    Time  6    Period  Weeks    Status  On-going      OT LONG TERM GOAL #3   Title  Patient will increase her RUE A/ROM to WNL in order to provide assist for overhead tasks.    Time  6    Period  Weeks    Status  On-going      OT LONG TERM GOAL #4   Title  Patient will report a decrease in pain of approximately 2/10 or less when using her RUE for normal daily tasks.    Time  6    Period  Weeks    Status  On-going      OT LONG TERM GOAL #5   Title  Patient will decrease fascial restrictions to trace amount in order to increase functional mobility needed to complete reach tasks.    Time  6    Period  Weeks    Status  On-going            Plan - 10/18/19 1617    Clinical Impression Statement  A: Pt was able to tolerate functional reaching tasks and scapular strengthening exercises this session. Provided new scapular exercises for HEP. Manual techniques were completed to address fascial restrictions. VC for form and  technique were provided. Full pass ROM was achieved for all ranges with external rotation being functional at this time. Pt is able to tolerate passive stretching more than previously.    Body Structure / Function / Physical Skills  ADL;UE functional use;Fascial restriction;Pain;ROM;IADL;Strength    Plan  P: overhead lacing and UBE bike. Follow up on MD appointment.    Consulted and Agree with Plan of Care  Patient       Patient will benefit from skilled therapeutic intervention in order to improve the following deficits and impairments:   Body Structure / Function / Physical Skills: ADL, UE functional use, Fascial restriction, Pain, ROM, IADL, Strength       Visit Diagnosis: Stiffness of right shoulder, not elsewhere classified  Acute pain of right shoulder  Other symptoms and signs involving the musculoskeletal system    Problem List Patient Active Problem List   Diagnosis Date Noted  . Abnormal  stress test 03/09/2019  . Coronary artery calcification 02/04/2019  . Exertional dyspnea 02/04/2019  . Palpitations 02/04/2019  . CAD (coronary atherosclerotic disease) 01/27/2019  . Aortic atherosclerosis ([place]) 01/27/2019  . Migraine 09/06/2018  . Carpal instability of right wrist 06/20/2018  . S/P patent foramen ovale closure 02/28/2015  . Chronic shoulder pain 07/24/2014  . Hyperlipidemia, unspecified 08/16/2013  . Hot flashes, menopausal 08/14/2013  . Smoker 08/07/2013  . 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 GERD (gastroesophageal reflux disease) 09/22/2012   Ailene Ravel, OTR/L,CBIS  [phone]  10/18/2019, 4:20 PM  [place] [address] [place], [place], [address] Phone: [phone]   Fax:  [phone]  Name: VELTA MCMORRIS MRN: [id number] Date of Birth: [birth year]

## 2019-10-19 ENCOUNTER — Encounter (HOSPITAL_COMMUNITY): Payer: BLUE CROSS/BLUE SHIELD

## 2019-10-20 ENCOUNTER — Ambulatory Visit (HOSPITAL_COMMUNITY): Payer: BLUE CROSS/BLUE SHIELD | Admitting: Occupational Therapy

## 2019-10-24 ENCOUNTER — Encounter (HOSPITAL_COMMUNITY): Payer: Self-pay | Admitting: Occupational Therapy

## 2019-10-24 ENCOUNTER — Other Ambulatory Visit: Payer: Self-pay

## 2019-10-24 ENCOUNTER — Ambulatory Visit (HOSPITAL_COMMUNITY): Payer: BLUE CROSS/BLUE SHIELD | Admitting: Occupational Therapy

## 2019-10-24 DIAGNOSIS — M25511 Pain in right shoulder: Secondary | ICD-10-CM

## 2019-10-24 DIAGNOSIS — M25611 Stiffness of right shoulder, not elsewhere classified: Secondary | ICD-10-CM | POA: Diagnosis not present

## 2019-10-24 DIAGNOSIS — R29898 Other symptoms and signs involving the musculoskeletal system: Secondary | ICD-10-CM

## 2019-10-24 NOTE — Therapy (Signed)
[place], [place], [address] Phone: [phone]   Fax:  [phone]  Occupational Therapy Treatment  Patient Details  Name: Jessica Shaw MRN: [id number] Date of Birth: 21-Sep-1965 Referring Provider (OT): Justice Britain, MD   Progress Note Reporting Period 09/19/19 to 10/24/2019  See note below for Objective Data and Assessment of Progress/Goals.       Encounter Date: 10/24/2019  OT End of Session - 10/24/19 1156    Visit Number  10    Number of Visits  13    Date for OT Re-Evaluation  10/31/19    Authorization Type  BCBS    Authorization Time Period  30 visit limit. 2 used at [facility]. $25 co-pay    Authorization - Visit Number  10    Authorization - Number of Visits  28    Progress Note Due on Visit  20    OT Start Time  1116    OT Stop Time  1155    OT Time Calculation (min)  39 min    Activity Tolerance  Patient tolerated treatment well    Behavior During Therapy  WFL for tasks assessed/performed       Past Medical History:  Diagnosis Date  . Complication of anesthesia   . Coronary artery disease    PFO closure 2006  . Embolism - blood clot    arm   left   . GERD (gastroesophageal reflux disease)   . HLD (hyperlipidemia)   . Migraine   . PONV (postoperative nausea and vomiting)   . Sleep apnea    mild OSA, no CPAP  . Smoker   . Spinal cord stimulator status 2006   in lower back, pt patient  . Stroke [facility])    TIA, no deficits    Past Surgical History:  Procedure Laterality Date  . CARDIAC CATHETERIZATION    . CHOLECYSTECTOMY    . COLONOSCOPY  08/04/2005   [id number] external hemorrhoids and anal papilla, otherwise normal  . COLONOSCOPY N/A 10/07/2012   Procedure: COLONOSCOPY;  Surgeon: Daneil Dolin, MD;  [facility];  Service: Endoscopy;  Laterality: N/A;  10:30  . COLONOSCOPY N/A 10/13/2016   Procedure: COLONOSCOPY;  Surgeon: Daneil Dolin, MD;  [facility];  Service: Endoscopy;  Laterality: N/A;  9:00am  . ESOPHAGOGASTRODUODENOSCOPY  04/20/2003   [id number] changes of reflux esophagitis/Limited gastroesophageal junction, otherwise normal  . ESOPHAGOGASTRODUODENOSCOPY N/A 10/07/2012   Procedure: ESOPHAGOGASTRODUODENOSCOPY (EGD);  Surgeon: Daneil Dolin, MD;  [facility];  Service: Endoscopy;  Laterality: N/A;  . INTRAVASCULAR PRESSURE WIRE/FFR STUDY N/A 03/14/2019   Procedure: INTRAVASCULAR PRESSURE WIRE/FFR STUDY;  Surgeon: Nigel Mormon, MD;  Location: [place] [facility];  Service: Cardiovascular;  Laterality: N/A;  . LEFT HEART CATH AND CORONARY ANGIOGRAPHY N/A 03/14/2019   Procedure: LEFT HEART CATH AND CORONARY ANGIOGRAPHY;  Surgeon: Nigel Mormon, MD;  Location: [place] [facility];  Service: Cardiovascular;  Laterality: N/A;  . PATENT FORAMEN OVALE CLOSURE    . POLYPECTOMY  10/13/2016   Procedure: POLYPECTOMY;  Surgeon: Daneil Dolin, MD;  [facility];  Service: Endoscopy;;  colon  . SHOULDER ARTHROSCOPY     left  . SPINAL CORD STIMULATOR IMPLANT    . TUBAL LIGATION    . WRIST ARTHROSCOPY WITH DEBRIDEMENT Right 06/23/2018   Procedure: RIGHT ARTHROSCOPY WRIST DEBRIDEMENT LUNO TRIQUETRAL TEAR SHRINKAGE;  Surgeon: Daryll Brod, MD;  Location:  [place];  Service: Orthopedics;  Laterality: Right;  AXILLARY BLAOCK    There were no vitals filed for this visit.  Subjective Assessment - 10/24/19 1115    Subjective   S: The doctor was really pleased with my progress.    Currently in Pain?  Yes    Pain Score  5     Pain Location  Shoulder    Pain Orientation  Right    Pain Descriptors / Indicators  Aching;Sore    Pain Type  Acute pain    Pain Radiating Towards  up to neck    Pain Onset  1 to 4 weeks ago    Pain Frequency  Constant    Aggravating Factors   movement and use    Pain Relieving Factors  pain medication, heat, ice    Effect of Pain on Daily Activities  mod effect on ADLs     Multiple Pain Sites  No         [facility] OT Assessment - 10/24/19 1115      Assessment   Medical Diagnosis  Right shoulder arthroscopy      Precautions   Precautions  Shoulder    Type of Shoulder Precautions  P/ROM, AA/ROM, A/ROM               OT Treatments/Exercises (OP) - 10/24/19 1118      Exercises   Exercises  Shoulder      Shoulder Exercises: Supine   Protraction  PROM;10 reps    Horizontal ABduction  PROM;10 reps    External Rotation  PROM;10 reps    Internal Rotation  PROM;10 reps    Flexion  PROM;AROM;10 reps    ABduction  PROM;10 reps      Shoulder Exercises: Standing   Extension  Theraband;10 reps    Theraband Level (Shoulder Extension)  Level 2 (Red)    Row  Theraband;10 reps    Theraband Level (Shoulder Row)  Level 2 (Red)    Retraction  Theraband;10 reps    Theraband Level (Shoulder Retraction)  Level 2 (Red)      Shoulder Exercises: ROM/Strengthening   UBE (Upper Arm Bike)  Level 2 3' foward 3' reverse   pace: 7.0-8.0   Ball on Wall  1' flexion 1' abduction    Other ROM/Strengthening Exercises  Ball pass behind back using green weighted ball, 10X for IR      Shoulder Exercises: Stretch   Internal Rotation Stretch  2 reps   20" holds, vertical towel     Functional Reaching Activities   High Level  Pt placing 10 cones on top shelf of overhead cabinet in flexion, removed in abduction.       Manual Therapy   Manual Therapy  Myofascial release    Manual therapy comments  manual therapy completed prior to exercises.     Myofascial Release  Myofascial release and manual stretching completed to right upper arm, trapezius, and scapularis region to decrease fascial restrictions and increase ROM               OT Short Term Goals - 09/20/19 1225      OT SHORT TERM GOAL #1   Title  Patient will be educated and independent with HEP in order to faciliate progress in therapy and increase ability to return to using her RUE for daily tasks.     Time  3    Period  Weeks    Status  On-going  Target Date  10/10/19      OT SHORT TERM GOAL #2   Title  Patient will increase RUE strength to 4-/5 to increase ability to complete light weight lifting activities with less difficulty.    Time  3    Period  Weeks    Status  On-going      OT SHORT TERM GOAL #3   Title  Patient will decrease fascial restriction in the RUE to min amount in order to increase functional mobility need to complete reaching tasks.    Time  3    Period  Weeks    Status  On-going      OT SHORT TERM GOAL #4   Title  Patient will report a decrease in pain level of approximately 4/10 when  completing basic daily tasks.    Time  3    Period  Weeks    Status  On-going        OT Long Term Goals - 09/20/19 1130      OT LONG TERM GOAL #1   Title  Patient will reach her highest level of independence while using her RUE for all daily and work related tasks 75% or more of the time.    Time  6    Period  Weeks    Status  On-going      OT LONG TERM GOAL #2   Title  Patient will increase RUE shoulder strength to 4+/5 in order to complete normal lifting tasks with less difficulty.    Time  6    Period  Weeks    Status  On-going      OT LONG TERM GOAL #3   Title  Patient will increase her RUE A/ROM to WNL in order to provide assist for overhead tasks.    Time  6    Period  Weeks    Status  On-going      OT LONG TERM GOAL #4   Title  Patient will report a decrease in pain of approximately 2/10 or less when using her RUE for normal daily tasks.    Time  6    Period  Weeks    Status  On-going      OT LONG TERM GOAL #5   Title  Patient will decrease fascial restrictions to trace amount in order to increase functional mobility needed to complete reach tasks.    Time  6    Period  Weeks    Status  On-going            Plan - 10/24/19 1151    Clinical Impression Statement  A: Pt reports MD is pleased with her progress and instructed her to keep up the  good work. Pt reports continued improvements in her ROM and she is now able to reach into her cabinets at home, she is also practicing er when washing her hair. Continued with manual techniques to address fascial restrictions this session. Added IR stretch and ball pass, pt reports she is able to reach the bottom of her bra strap now. Pt demonstrating ROM WFL, continued with functional reaching this session. Added overhead lacing and increased UBE to level 2. Verbal cuing for form and technique.    Body Structure / Function / Physical Skills  ADL;UE functional use;Fascial restriction;Pain;ROM;IADL;Strength    Plan  P: Attempt green therapy ball strengthening;    OT Home Exercise Plan  Eval: AA/ROM shoulder supine, 09/28/19 - a/rom in seated or supine.  Patient will benefit from skilled therapeutic intervention in order to improve the following deficits and impairments:   Body Structure / Function / Physical Skills: ADL, UE functional use, Fascial restriction, Pain, ROM, IADL, Strength       Visit Diagnosis: Stiffness of right shoulder, not elsewhere classified  Acute pain of right shoulder  Other symptoms and signs involving the musculoskeletal system    Problem List Patient Active Problem List   Diagnosis Date Noted  . Abnormal stress test 03/09/2019  . Coronary artery calcification 02/04/2019  . Exertional dyspnea 02/04/2019  . Palpitations 02/04/2019  . CAD (coronary atherosclerotic disease) 01/27/2019  . Aortic atherosclerosis ([place]) 01/27/2019  . Migraine 09/06/2018  . Carpal instability of right wrist 06/20/2018  . S/P patent foramen ovale closure 02/28/2015  . Chronic shoulder pain 07/24/2014  . Hyperlipidemia, unspecified 08/16/2013  . Hot flashes, menopausal 08/14/2013  . Smoker 08/07/2013  . 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 GERD (gastroesophageal reflux disease) 09/22/2012   Jessica Shaw, OTR/L  [phone] 10/24/2019, 11:58 AM  [place], [place], [address] Phone: [phone]   Fax:  [phone]  Name: Jessica Shaw MRN: [id number] Date of Birth: 1966-04-21

## 2019-10-26 ENCOUNTER — Encounter (HOSPITAL_COMMUNITY): Payer: BLUE CROSS/BLUE SHIELD

## 2019-10-27 ENCOUNTER — Other Ambulatory Visit: Payer: Self-pay

## 2019-10-27 ENCOUNTER — Encounter (HOSPITAL_COMMUNITY): Payer: Self-pay | Admitting: Occupational Therapy

## 2019-10-27 ENCOUNTER — Ambulatory Visit (HOSPITAL_COMMUNITY): Payer: BLUE CROSS/BLUE SHIELD | Admitting: Occupational Therapy

## 2019-10-27 DIAGNOSIS — M25511 Pain in right shoulder: Secondary | ICD-10-CM

## 2019-10-27 DIAGNOSIS — R29898 Other symptoms and signs involving the musculoskeletal system: Secondary | ICD-10-CM | POA: Diagnosis not present

## 2019-10-27 DIAGNOSIS — M25611 Stiffness of right shoulder, not elsewhere classified: Secondary | ICD-10-CM

## 2019-10-27 NOTE — Therapy (Signed)
[place], [place], [address] Phone: [phone]   Fax:  [phone]  Occupational Therapy Reassessment, Treatment (recertification)  Patient Details  Name: Jessica Shaw MRN: [id number] Date of Birth: 1966-01-26 Referring Provider (OT): Justice Britain, MD   Encounter Date: 10/27/2019  OT End of Session - 10/27/19 1501    Visit Number  11    Number of Visits  13    Date for OT Re-Evaluation  12/03/19    Authorization Type  BCBS    Authorization Time Period  30 visit limit. 2 used at [facility]. $25 co-pay    Authorization - Visit Number  11    Authorization - Number of Visits  28    Progress Note Due on Visit  20    OT Start Time  1345    OT Stop Time  1425    OT Time Calculation (min)  40 min    Activity Tolerance  Patient tolerated treatment well    Behavior During Therapy  WFL for tasks assessed/performed       Past Medical History:  Diagnosis Date  . Complication of anesthesia   . Coronary artery disease    PFO closure 2006  . Embolism - blood clot    arm   left   . GERD (gastroesophageal reflux disease)   . HLD (hyperlipidemia)   . Migraine   . PONV (postoperative nausea and vomiting)   . Sleep apnea    mild OSA, no CPAP  . Smoker   . Spinal cord stimulator status 2006   in lower back, pt patient  . Stroke [facility])    TIA, no deficits    Past Surgical History:  Procedure Laterality Date  . CARDIAC CATHETERIZATION    . CHOLECYSTECTOMY    . COLONOSCOPY  08/04/2005   QJJ:HERDE external hemorrhoids and anal papilla, otherwise normal  . COLONOSCOPY N/A 10/07/2012   Procedure: COLONOSCOPY;  Surgeon: Daneil Dolin, MD;  [facility];  Service: Endoscopy;  Laterality: N/A;  10:30  . COLONOSCOPY N/A 10/13/2016   Procedure: COLONOSCOPY;  Surgeon: Daneil Dolin, MD;  [facility];  Service: Endoscopy;  Laterality: N/A;  9:00am  . ESOPHAGOGASTRODUODENOSCOPY  04/20/2003    YCX:KGYJ changes of reflux esophagitis/Limited gastroesophageal junction, otherwise normal  . ESOPHAGOGASTRODUODENOSCOPY N/A 10/07/2012   Procedure: ESOPHAGOGASTRODUODENOSCOPY (EGD);  Surgeon: Daneil Dolin, MD;  [facility];  Service: Endoscopy;  Laterality: N/A;  . INTRAVASCULAR PRESSURE WIRE/FFR STUDY N/A 03/14/2019   Procedure: INTRAVASCULAR PRESSURE WIRE/FFR STUDY;  Surgeon: Nigel Mormon, MD;  Location: [place] [facility];  Service: Cardiovascular;  Laterality: N/A;  . LEFT HEART CATH AND CORONARY ANGIOGRAPHY N/A 03/14/2019   Procedure: LEFT HEART CATH AND CORONARY ANGIOGRAPHY;  Surgeon: Nigel Mormon, MD;  Location: [place] CV [facility];  Service: Cardiovascular;  Laterality: N/A;  . PATENT FORAMEN OVALE CLOSURE    . POLYPECTOMY  10/13/2016   Procedure: POLYPECTOMY;  Surgeon: Daneil Dolin, MD;  [facility];  Service: Endoscopy;;  colon  . SHOULDER ARTHROSCOPY     left  . SPINAL CORD STIMULATOR IMPLANT    . TUBAL LIGATION    . WRIST ARTHROSCOPY WITH DEBRIDEMENT Right 06/23/2018   Procedure: RIGHT ARTHROSCOPY WRIST DEBRIDEMENT LUNO TRIQUETRAL TEAR SHRINKAGE;  Surgeon: Daryll Brod, MD;  Location: [place];  Service: Orthopedics;  Laterality: Right;  AXILLARY BLAOCK    There were no vitals filed for  this visit.  Subjective Assessment - 10/27/19 1345    Subjective   S: I'll go to reach for something and it will catch.    Currently in Pain?  Yes    Pain Score  3    worse with movement   Pain Location  Shoulder    Pain Orientation  Right    Pain Descriptors / Indicators  Aching;Sore    Pain Type  Acute pain    Pain Radiating Towards  up to neck    Pain Onset  1 to 4 weeks ago    Pain Frequency  Intermittent    Aggravating Factors   movement and use    Pain Relieving Factors  pain medication, heat, ice    Effect of Pain on Daily Activities  mod effect on ADLs    Multiple Pain Sites  No         [facility] OT Assessment - 10/27/19 1344       Assessment   Medical Diagnosis  Right shoulder arthroscopy      Precautions   Precautions  Shoulder    Type of Shoulder Precautions  P/ROM, AA/ROM, A/ROM      AROM   Overall AROM Comments  Assessed seated. IR/er adducted    AROM Assessment Site  Shoulder    Right/Left Shoulder  Right    Right Shoulder Flexion  163 Degrees   130 previous   Right Shoulder ABduction  180 Degrees   134 previous   Right Shoulder Internal Rotation  90 Degrees   same as previous   Right Shoulder External Rotation  45 Degrees   40 previous     PROM   Overall PROM Comments  Assessed supine. IR/er adducted    PROM Assessment Site  Shoulder    Right/Left Shoulder  Right    Right Shoulder Flexion  160 Degrees   140 previous   Right Shoulder ABduction  180 Degrees   same as previous   Right Shoulder Internal Rotation  90 Degrees   same as previous   Right Shoulder External Rotation  48 Degrees   40 previous     Strength   Overall Strength Comments  Assessed seated. IR/er adducted    Strength Assessment Site  Shoulder    Right/Left Shoulder  Right    Right Shoulder Flexion  4+/5   3/5 previous   Right Shoulder ABduction  4+/5   3/5 previous   Right Shoulder Internal Rotation  5/5   4+/5 previous   Right Shoulder External Rotation  4/5   4-/5 previous              OT Treatments/Exercises (OP) - 10/27/19 1347      Exercises   Exercises  Shoulder      Shoulder Exercises: Supine   Protraction  PROM;10 reps    Horizontal ABduction  PROM;10 reps    External Rotation  PROM;10 reps    Internal Rotation  PROM;10 reps    Flexion  PROM;10 reps    ABduction  PROM;10 reps      Shoulder Exercises: Standing   Protraction  AROM;10 reps    Horizontal ABduction  AROM;10 reps    External Rotation  AROM;10 reps    Internal Rotation  AROM;10 reps    Flexion  AROM;10 reps    ABduction  AROM;10 reps      Shoulder Exercises: ROM/Strengthening   X to V Arms  10X      Shoulder Exercises:  Stretch  Internal Rotation Stretch  2 reps   20" vertical towel   Wall Stretch - Flexion  2 reps;20 seconds    Other Shoulder Stretches  doorway stretch, 2X, 20"      Manual Therapy   Manual Therapy  Myofascial release    Manual therapy comments  manual therapy completed prior to exercises.     Myofascial Release  Myofascial release and manual stretching completed to right upper arm, trapezius, and scapularis region to decrease fascial restrictions and increase ROM               OT Short Term Goals - 10/27/19 1407      OT SHORT TERM GOAL #1   Title  Patient will be educated and independent with HEP in order to faciliate progress in therapy and increase ability to return to using her RUE for daily tasks.    Time  3    Period  Weeks    Status  Achieved    Target Date  10/10/19      OT SHORT TERM GOAL #2   Title  Patient will increase RUE strength to 4-/5 to increase ability to complete light weight lifting activities with less difficulty.    Time  3    Period  Weeks    Status  Achieved      OT SHORT TERM GOAL #3   Title  Patient will decrease fascial restriction in the RUE to min amount in order to increase functional mobility need to complete reaching tasks.    Time  3    Period  Weeks    Status  Partially Met      OT SHORT TERM GOAL #4   Title  Patient will report a decrease in pain level of approximately 4/10 when  completing basic daily tasks.    Time  3    Period  Weeks    Status  On-going        OT Long Term Goals - 10/27/19 1408      OT LONG TERM GOAL #1   Title  Patient will reach her highest level of independence while using her RUE for all daily and work related tasks 75% or more of the time.    Time  6    Period  Weeks    Status  On-going      OT LONG TERM GOAL #2   Title  Patient will increase RUE shoulder strength to 4+/5 in order to complete normal lifting tasks with less difficulty.    Time  6    Period  Weeks    Status  Partially Met       OT LONG TERM GOAL #3   Title  Patient will increase her RUE A/ROM to WNL in order to provide assist for overhead tasks.    Time  6    Period  Weeks    Status  Achieved      OT LONG TERM GOAL #4   Title  Patient will report a decrease in pain of approximately 2/10 or less when using her RUE for normal daily tasks.    Time  6    Period  Weeks    Status  On-going      OT LONG TERM GOAL #5   Title  Patient will decrease fascial restrictions to trace amount in order to increase functional mobility needed to complete reach tasks.    Time  6    Period  Weeks  Status  On-going            Plan - 10/27/19 1425    Clinical Impression Statement  A: Reassessment completed this session, pt has met 2 STGs, 1 LTG, and has partially met 1 LTG and 1 STG. Pt reports improvement in ROM and is able to reach items in high cabinets now. She is also able to reach behind her head and is improving in reaching behind her back. Pt continues to have pain radiating from shoulder up her neck that is limiting her ability to perform tasks. Pt completing shoulder stretches this session, as well as A/ROM in standing. Continued with manual techniques to address fascial restrictions. Verbal cuing for form and technique.    OT Occupational Profile and History  Problem Focused Assessment - Including review of records relating to presenting problem    Occupational performance deficits (Please refer to evaluation for details):  ADL's    Body Structure / Function / Physical Skills  ADL;UE functional use;Fascial restriction;Pain;ROM;IADL;Strength    Rehab Potential  Good    Clinical Decision Making  Limited treatment options, no task modification necessary    Comorbidities Affecting Occupational Performance:  Presence of comorbidities impacting occupational performance    Comorbidities impacting occupational performance description:  History of CVA and Heart attack    Modification or Assistance to Complete Evaluation    No modification of tasks or assist necessary to complete eval    OT Frequency  1x / week    OT Duration  4 weeks    OT Treatment/Interventions  Self-care/ADL training;Ultrasound;Electrical Stimulation;Cryotherapy;Moist Heat;Neuromuscular education;Therapeutic exercise;Manual Therapy;Therapeutic activities;Passive range of motion;Patient/family education;DME and/or AE instruction    Plan  P: Continue with skilled OT services to decrease pain, fascial restrictions, and increase functional use of LUE during ADLs. Next session: continue with shoulder stretches and update for HEP    OT Home Exercise Plan  Eval: AA/ROM shoulder supine, 09/28/19 - a/rom in seated or supine.    Consulted and Agree with Plan of Care  Patient       Patient will benefit from skilled therapeutic intervention in order to improve the following deficits and impairments:   Body Structure / Function / Physical Skills: ADL, UE functional use, Fascial restriction, Pain, ROM, IADL, Strength       Visit Diagnosis: Acute pain of right shoulder  Stiffness of right shoulder, not elsewhere classified  Other symptoms and signs involving the musculoskeletal system    Problem List Patient Active Problem List   Diagnosis Date Noted  . Abnormal stress test 03/09/2019  . Coronary artery calcification 02/04/2019  . Exertional dyspnea 02/04/2019  . Palpitations 02/04/2019  . CAD (coronary atherosclerotic disease) 01/27/2019  . Aortic atherosclerosis ([place]) 01/27/2019  . Migraine 09/06/2018  . Carpal instability of right wrist 06/20/2018  . S/P patent foramen ovale closure 02/28/2015  . Chronic shoulder pain 07/24/2014  . Hyperlipidemia, unspecified 08/16/2013  . Hot flashes, menopausal 08/14/2013  . Smoker 08/07/2013  . 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 GERD (gastroesophageal reflux disease) 09/22/2012   Guadelupe Sabin, OTR/L  [phone] 10/27/2019, 3:02 PM  [place], [place], [address] Phone: [phone]   Fax:  [phone]  Name: Jessica Shaw MRN: [id number] Date of Birth: May 02, 1966

## 2019-11-06 ENCOUNTER — Ambulatory Visit (HOSPITAL_COMMUNITY): Payer: BLUE CROSS/BLUE SHIELD | Admitting: Occupational Therapy

## 2019-11-16 ENCOUNTER — Ambulatory Visit (HOSPITAL_COMMUNITY): Payer: BLUE CROSS/BLUE SHIELD | Attending: Orthopedic Surgery | Admitting: Specialist

## 2019-11-16 ENCOUNTER — Encounter (HOSPITAL_COMMUNITY): Payer: Self-pay | Admitting: Specialist

## 2019-11-16 ENCOUNTER — Other Ambulatory Visit: Payer: Self-pay

## 2019-11-16 DIAGNOSIS — M25511 Pain in right shoulder: Secondary | ICD-10-CM | POA: Diagnosis not present

## 2019-11-16 DIAGNOSIS — M25611 Stiffness of right shoulder, not elsewhere classified: Secondary | ICD-10-CM | POA: Insufficient documentation

## 2019-11-16 NOTE — Patient Instructions (Signed)
  Flexibility: Upper Trapezius Stretch   Gently grasp right side of head while reaching behind back with other hand. Tilt head away until a gentle stretch is felt. Hold ____ seconds. Repeat ____ times per set. Do ____ sets per session. Do ____ sessions per day.  http://orth.exer.us/340    Scapular Retraction (Standing)   With arms at sides, pinch shoulder blades together. Repeat ____ times per set. Do ____ sets per session. Do ____ sessions per day.  http://orth.exer.us/944   Flexibility: Neck Retraction   Pull head straight back, keeping eyes and jaw level. Repeat ____ times per set. Do ____ sets per session. Do ____ sessions per day.  Levator Scapula Stretch    Place left hand on same side shoulder blade. With other hand, gently stretch head down and away. Hold ____ seconds. Repeat ____ times per set. Do ____ sets per session. Do ____ sessions per day.  http://orth.exer.us/349   Copyright  VHI. All rights reserved.

## 2019-11-17 NOTE — Therapy (Signed)
[place], [place], [address] Phone: [phone]   Fax:  [phone]  Occupational Therapy Treatment  Patient Details  Name: Jessica Shaw MRN: [id number] Date of Birth: 1965/07/24 Referring Provider (OT): Justice Britain, MD   Encounter Date: 11/16/2019  OT End of Session - 11/16/19 2252    Visit Number  12    Number of Visits  13    Date for OT Re-Evaluation  12/03/19    Authorization Type  BCBS    Authorization Time Period  30 visit limit. 2 used at [facility]. $25 co-pay    Authorization - Visit Number  62    Authorization - Number of Visits  28    Progress Note Due on Visit  20       Past Medical History:  Diagnosis Date  . Complication of anesthesia   . Coronary artery disease    PFO closure 2006  . Embolism - blood clot    arm   left   . GERD (gastroesophageal reflux disease)   . HLD (hyperlipidemia)   . Migraine   . PONV (postoperative nausea and vomiting)   . Sleep apnea    mild OSA, no CPAP  . Smoker   . Spinal cord stimulator status 2006   in lower back, pt patient  . Stroke [facility])    TIA, no deficits    Past Surgical History:  Procedure Laterality Date  . CARDIAC CATHETERIZATION    . CHOLECYSTECTOMY    . COLONOSCOPY  08/04/2005   YIA:XKPVV external hemorrhoids and anal papilla, otherwise normal  . COLONOSCOPY N/A 10/07/2012   Procedure: COLONOSCOPY;  Surgeon: Daneil Dolin, MD;  [facility];  Service: Endoscopy;  Laterality: N/A;  10:30  . COLONOSCOPY N/A 10/13/2016   Procedure: COLONOSCOPY;  Surgeon: Daneil Dolin, MD;  [facility];  Service: Endoscopy;  Laterality: N/A;  9:00am  . ESOPHAGOGASTRODUODENOSCOPY  04/20/2003   ZSM:OLMB changes of reflux esophagitis/Limited gastroesophageal junction, otherwise normal  . ESOPHAGOGASTRODUODENOSCOPY N/A 10/07/2012   Procedure: ESOPHAGOGASTRODUODENOSCOPY (EGD);  Surgeon: Daneil Dolin, MD;  [facility];   Service: Endoscopy;  Laterality: N/A;  . INTRAVASCULAR PRESSURE WIRE/FFR STUDY N/A 03/14/2019   Procedure: INTRAVASCULAR PRESSURE WIRE/FFR STUDY;  Surgeon: Nigel Mormon, MD;  Location: [place] [facility];  Service: Cardiovascular;  Laterality: N/A;  . LEFT HEART CATH AND CORONARY ANGIOGRAPHY N/A 03/14/2019   Procedure: LEFT HEART CATH AND CORONARY ANGIOGRAPHY;  Surgeon: Nigel Mormon, MD;  Location: [place] [facility];  Service: Cardiovascular;  Laterality: N/A;  . PATENT FORAMEN OVALE CLOSURE    . POLYPECTOMY  10/13/2016   Procedure: POLYPECTOMY;  Surgeon: Daneil Dolin, MD;  [facility];  Service: Endoscopy;;  colon  . SHOULDER ARTHROSCOPY     left  . SPINAL CORD STIMULATOR IMPLANT    . TUBAL LIGATION    . WRIST ARTHROSCOPY WITH DEBRIDEMENT Right 06/23/2018   Procedure: RIGHT ARTHROSCOPY WRIST DEBRIDEMENT LUNO TRIQUETRAL TEAR SHRINKAGE;  Surgeon: Daryll Brod, MD;  Location: [facility];  Service: Orthopedics;  Laterality: Right;  AXILLARY BLAOCK    There were no vitals filed for this visit.  Subjective Assessment - 11/16/19 2250    Subjective   S:  It is feeling alot better.  It is sore on the end of my collar bone and into my neck.    Currently in Pain?  Yes    Pain Score  2     Pain Location  Shoulder    Pain Orientation  Distal;Right    Pain Descriptors / Indicators  Sore    Pain Type  Acute pain         [facility] OT Assessment - 11/17/19 0001      Assessment   Medical Diagnosis  Right shoulder arthroscopy      Precautions   Precautions  Shoulder    Type of Shoulder Precautions  P/ROM, AA/ROM, A/ROM      Restrictions   Weight Bearing Restrictions  Yes      Observation/Other Assessments   Focus on Therapeutic Outcomes (FOTO)   78/100   was 54/100     Palpation   Palpation comment  trace fascial restrictions       AROM   Overall AROM Comments  Assessed seated. IR/er adducted    Right Shoulder Flexion  165 Degrees   163   Right  Shoulder ABduction  180 Degrees   180   Right Shoulder Internal Rotation  90 Degrees   90   Right Shoulder External Rotation  68 Degrees   45     PROM   Overall PROM Comments  WNL       Strength   Overall Strength Comments  Assessed seated. IR/er adducted    Right Shoulder Flexion  5/5   4+/5   Right Shoulder ABduction  5/5   4+/5   Right Shoulder Internal Rotation  5/5   5/5   Right Shoulder External Rotation  5/5   4/5              OT Treatments/Exercises (OP) - 11/17/19 0001      Manual Therapy   Manual Therapy  Myofascial release    Manual therapy comments  manual therapy completed prior to exercises.     Myofascial Release  Myofascial release and manual stretching completed to right upper arm, trapezius, and scapularis region to decrease fascial restrictions and increase ROM manual cervical traction and MFR to upper trapezius and distal clavicle region              OT Education - 11/16/19 2251    Education Details  self massage, massage therapy cervical stretches    Person(s) Educated  Patient    Methods  Explanation;Demonstration;Verbal cues;Handout    Comprehension  Returned demonstration;Verbalized understanding       OT Short Term Goals - 11/16/19 2257      OT SHORT TERM GOAL #1   Title  Patient will be educated and independent with HEP in order to faciliate progress in therapy and increase ability to return to using her RUE for daily tasks.    Time  3    Period  Weeks    Status  Achieved    Target Date  10/10/19      OT SHORT TERM GOAL #2   Title  Patient will increase RUE strength to 4-/5 to increase ability to complete light weight lifting activities with less difficulty.    Time  3    Period  Weeks    Status  Achieved      OT SHORT TERM GOAL #3   Title  Patient will decrease fascial restriction in the RUE to min amount in order to increase functional mobility need to complete reaching tasks.    Time  3    Period  Weeks    Status   Achieved      OT SHORT TERM GOAL #4  Title  Patient will report a decrease in pain level of approximately 4/10 when  completing basic daily tasks.    Time  3    Period  Weeks    Status  Achieved        OT Long Term Goals - 11/16/19 2257      OT LONG TERM GOAL #1   Title  Patient will reach her highest level of independence while using her RUE for all daily and work related tasks 75% or more of the time.    Time  6    Period  Weeks    Status  On-going      OT LONG TERM GOAL #2   Title  Patient will increase RUE shoulder strength to 4+/5 in order to complete normal lifting tasks with less difficulty.    Time  6    Period  Weeks    Status  Achieved      OT LONG TERM GOAL #3   Title  Patient will increase her RUE A/ROM to WNL in order to provide assist for overhead tasks.    Time  6    Period  Weeks    Status  Achieved      OT LONG TERM GOAL #4   Title  Patient will report a decrease in pain of approximately 2/10 or less when using her RUE for normal daily tasks.    Time  6    Period  Weeks    Status  Achieved      OT LONG TERM GOAL #5   Title  Patient will decrease fascial restrictions to trace amount in order to increase functional mobility needed to complete reach tasks.    Time  6    Period  Weeks    Status  Achieved            Plan - 11/16/19 2253    Clinical Impression Statement  A:  Patient has significant improvement in A/ROM and strength in right shoulder.  Patient states she is able to complete all desired daily tasks.  Continues to have pain and soreness in right distal clavicle and upper trapezius region.  OT recommends self massage or even massage thrapy to continue to minimize pain.    Body Structure / Function / Physical Skills  ADL;UE functional use;Fascial restriction;Pain;ROM;IADL;Strength    Plan  P:  DC from skilled OT intervention this date as patient has met OT goals and is satisfied with current status.       Patient will benefit from  skilled therapeutic intervention in order to improve the following deficits and impairments:   Body Structure / Function / Physical Skills: ADL, UE functional use, Fascial restriction, Pain, ROM, IADL, Strength       Visit Diagnosis: Acute pain of right shoulder  Stiffness of right shoulder, not elsewhere classified    Problem List Patient Active Problem List   Diagnosis Date Noted  . Abnormal stress test 03/09/2019  . Coronary artery calcification 02/04/2019  . Exertional dyspnea 02/04/2019  . Palpitations 02/04/2019  . CAD (coronary atherosclerotic disease) 01/27/2019  . Aortic atherosclerosis ([place]) 01/27/2019  . Migraine 09/06/2018  . Carpal instability of right wrist 06/20/2018  . S/P patent foramen ovale closure 02/28/2015  . Chronic shoulder pain 07/24/2014  . Hyperlipidemia, unspecified 08/16/2013  . Hot flashes, menopausal 08/14/2013  . Smoker 08/07/2013  . 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 GERD (gastroesophageal reflux disease) 09/22/2012    Vangie Bicker, [place], OTR/L [phone] OCCUPATIONAL  THERAPY DISCHARGE SUMMARY  Visits from Start of Care: 12  Current functional level related to goals / functional outcomes: See above   Remaining deficits: See above  Education / Equipment: See above   Plan: Patient agrees to discharge.  Patient goals were met. Patient is being discharged due to meeting the stated rehab goals.  ?????       Vangie Bicker, [place], OTR/L [phone]    11/17/2019, 8:43 AM  [place] [address] [place], [place], [address] Phone: [phone]   Fax:  [phone]  Name: Jessica Shaw MRN: [id number] Date of Birth: [birth year]

## 2019-11-20 ENCOUNTER — Ambulatory Visit (HOSPITAL_COMMUNITY): Payer: BLUE CROSS/BLUE SHIELD | Admitting: Occupational Therapy

## 2019-11-27 ENCOUNTER — Encounter (HOSPITAL_COMMUNITY): Payer: BLUE CROSS/BLUE SHIELD | Admitting: Occupational Therapy

## 2019-12-10 ENCOUNTER — Other Ambulatory Visit: Payer: Self-pay | Admitting: Cardiology

## 2019-12-10 DIAGNOSIS — I25118 Atherosclerotic heart disease of native coronary artery with other forms of angina pectoris: Secondary | ICD-10-CM

## 2020-01-25 ENCOUNTER — Other Ambulatory Visit: Payer: Self-pay | Admitting: Cardiology

## 2020-01-25 DIAGNOSIS — I2510 Atherosclerotic heart disease of native coronary artery without angina pectoris: Secondary | ICD-10-CM

## 2020-01-25 DIAGNOSIS — R0609 Other forms of dyspnea: Secondary | ICD-10-CM

## 2020-01-25 DIAGNOSIS — R0600 Dyspnea, unspecified: Secondary | ICD-10-CM

## 2020-01-25 DIAGNOSIS — R9439 Abnormal result of other cardiovascular function study: Secondary | ICD-10-CM

## 2020-03-28 ENCOUNTER — Other Ambulatory Visit: Payer: Self-pay | Admitting: Family Medicine

## 2020-03-28 DIAGNOSIS — K219 Gastro-esophageal reflux disease without esophagitis: Secondary | ICD-10-CM

## 2020-05-03 ENCOUNTER — Other Ambulatory Visit: Payer: Self-pay | Admitting: Family Medicine

## 2020-05-03 ENCOUNTER — Other Ambulatory Visit: Payer: Self-pay | Admitting: Cardiology

## 2020-05-03 DIAGNOSIS — I25118 Atherosclerotic heart disease of native coronary artery with other forms of angina pectoris: Secondary | ICD-10-CM

## 2020-05-03 DIAGNOSIS — E785 Hyperlipidemia, unspecified: Secondary | ICD-10-CM

## 2020-05-03 DIAGNOSIS — I700 Atherosclerosis of aorta: Secondary | ICD-10-CM

## 2020-05-03 DIAGNOSIS — R0609 Other forms of dyspnea: Secondary | ICD-10-CM

## 2020-05-03 DIAGNOSIS — I2510 Atherosclerotic heart disease of native coronary artery without angina pectoris: Secondary | ICD-10-CM

## 2020-05-03 DIAGNOSIS — R0600 Dyspnea, unspecified: Secondary | ICD-10-CM

## 2020-05-03 DIAGNOSIS — R9439 Abnormal result of other cardiovascular function study: Secondary | ICD-10-CM

## 2020-06-17 ENCOUNTER — Encounter: Payer: Self-pay | Admitting: Family Medicine

## 2020-06-17 ENCOUNTER — Ambulatory Visit (INDEPENDENT_AMBULATORY_CARE_PROVIDER_SITE_OTHER): Payer: BC Managed Care – PPO | Admitting: Family Medicine

## 2020-06-17 ENCOUNTER — Other Ambulatory Visit: Payer: Self-pay

## 2020-06-17 VITALS — BP 112/68 | HR 82 | Temp 98.0°F | Resp 14 | Ht 62.0 in | Wt 149.0 lb

## 2020-06-17 DIAGNOSIS — I25118 Atherosclerotic heart disease of native coronary artery with other forms of angina pectoris: Secondary | ICD-10-CM

## 2020-06-17 DIAGNOSIS — R7303 Prediabetes: Secondary | ICD-10-CM

## 2020-06-17 DIAGNOSIS — I700 Atherosclerosis of aorta: Secondary | ICD-10-CM

## 2020-06-17 DIAGNOSIS — Z0000 Encounter for general adult medical examination without abnormal findings: Secondary | ICD-10-CM

## 2020-06-17 DIAGNOSIS — Z0001 Encounter for general adult medical examination with abnormal findings: Secondary | ICD-10-CM

## 2020-06-17 DIAGNOSIS — K219 Gastro-esophageal reflux disease without esophagitis: Secondary | ICD-10-CM

## 2020-06-17 DIAGNOSIS — E785 Hyperlipidemia, unspecified: Secondary | ICD-10-CM

## 2020-06-17 DIAGNOSIS — Z1231 Encounter for screening mammogram for malignant neoplasm of breast: Secondary | ICD-10-CM

## 2020-06-17 DIAGNOSIS — G43009 Migraine without aura, not intractable, without status migrainosus: Secondary | ICD-10-CM

## 2020-06-17 DIAGNOSIS — F17200 Nicotine dependence, unspecified, uncomplicated: Secondary | ICD-10-CM

## 2020-06-17 MED ORDER — ATORVASTATIN CALCIUM 80 MG PO TABS: 80.0000 mg | ORAL_TABLET | Freq: Every day | ORAL | 3 refills | Status: DC

## 2020-06-17 MED ORDER — TRIAMCINOLONE ACETONIDE 0.1 % EX CREA: 1.0000 "application " | TOPICAL_CREAM | Freq: Two times a day (BID) | CUTANEOUS | 0 refills | Status: DC

## 2020-06-17 NOTE — Progress Notes (Signed)
Subjective:    Patient ID: Jessica Shaw, female    DOB: [birth year], 55 y.o.   MRN: [id number]  HPI Patient has a history of arteriosclerotic cardiovascular disease with a TIA.  She also has a history of tobacco abuse.  She has moderate nonobstructive coronary artery disease on catheterization from 2020.  She has been off her Lipitor now for more than a month.  She continues to smoke.  Blood pressure today is well controlled.  Last colonoscopy was in 2018.  There was 3 polyps 2 of which were tubular adenomas so she needs a repeat colonoscopy in 2023.  She is overdue for mammogram.  She is also overdue for a Pap smear.  She is also due for Covid vaccination, Pneumovax 23, flu shot, and a shingles vaccine.  I explained all of these to her in detail and she declines all of them today. Past Medical History:  Diagnosis Date  . Complication of anesthesia   . Coronary artery disease    PFO closure 2006  . Embolism - blood clot    arm   left   . GERD (gastroesophageal reflux disease)   . HLD (hyperlipidemia)   . Migraine   . PONV (postoperative nausea and vomiting)   . Sleep apnea    mild OSA, no CPAP  . Smoker   . Spinal cord stimulator status 2006   in lower back, pt patient  . Stroke [facility])    TIA, no deficits   Past Surgical History:  Procedure Laterality Date  . CARDIAC CATHETERIZATION    . CHOLECYSTECTOMY    . COLONOSCOPY  08/04/2005   [id number] external hemorrhoids and anal papilla, otherwise normal  . COLONOSCOPY N/A 10/07/2012   Procedure: COLONOSCOPY;  Surgeon: Daneil Dolin, MD;  [facility];  Service: Endoscopy;  Laterality: N/A;  10:30  . COLONOSCOPY N/A 10/13/2016   Procedure: COLONOSCOPY;  Surgeon: Daneil Dolin, MD;  [facility];  Service: Endoscopy;  Laterality: N/A;  9:00am  . ESOPHAGOGASTRODUODENOSCOPY  04/20/2003   [id number] changes of reflux esophagitis/Limited gastroesophageal junction, otherwise normal  . ESOPHAGOGASTRODUODENOSCOPY N/A  10/07/2012   Procedure: ESOPHAGOGASTRODUODENOSCOPY (EGD);  Surgeon: Daneil Dolin, MD;  [facility];  Service: Endoscopy;  Laterality: N/A;  . INTRAVASCULAR PRESSURE WIRE/FFR STUDY N/A 03/14/2019   Procedure: INTRAVASCULAR PRESSURE WIRE/FFR STUDY;  Surgeon: Nigel Mormon, MD;  Location: [place] [facility];  Service: Cardiovascular;  Laterality: N/A;  . LEFT HEART CATH AND CORONARY ANGIOGRAPHY N/A 03/14/2019   Procedure: LEFT HEART CATH AND CORONARY ANGIOGRAPHY;  Surgeon: Nigel Mormon, MD;  Location: [place] [facility];  Service: Cardiovascular;  Laterality: N/A;  . PATENT FORAMEN OVALE CLOSURE    . POLYPECTOMY  10/13/2016   Procedure: POLYPECTOMY;  Surgeon: Daneil Dolin, MD;  [facility];  Service: Endoscopy;;  colon  . SHOULDER ARTHROSCOPY     left  . SPINAL CORD STIMULATOR IMPLANT    . TUBAL LIGATION    . WRIST ARTHROSCOPY WITH DEBRIDEMENT Right 06/23/2018   Procedure: RIGHT ARTHROSCOPY WRIST DEBRIDEMENT LUNO TRIQUETRAL TEAR SHRINKAGE;  Surgeon: Daryll Brod, MD;  Location: [place];  Service: Orthopedics;  Laterality: Right;  AXILLARY BLAOCK   Current Outpatient Medications on File Prior to Visit  Medication Sig Dispense Refill  . amLODipine (NORVASC) 2.5 MG tablet Take 1 tablet by mouth once daily 90 tablet 1  . aspirin EC 81 MG tablet Take 81 mg by mouth daily.    Marland Kitchen  eletriptan (RELPAX) 40 MG tablet TAKE 1 TABLET BY MOUTH ONCE DAILY AS NEEDED FOR  MIGRAINE **REPEAT IN 2 HOURS IF NEEDED** (Patient taking differently: Take 40 mg by mouth every 2 (two) hours as needed for migraine.) 60 tablet 1  . Ibuprofen 200 MG CAPS Take 400 mg by mouth daily as needed (migraines).     . metoprolol succinate (TOPROL-XL) 50 MG 24 hr tablet TAKE 1 TABLET BY MOUTH ONCE DAILY - TAKE WITH OR IMMEDIATELY FOLLOWING A MEAL 90 tablet 0  . nitroGLYCERIN (NITROSTAT) 0.4 MG SL tablet Place 1 tablet (0.4 mg total) under the tongue every 5 (five) minutes as needed for  chest pain. 90 tablet 3  . omeprazole (PRILOSEC) 20 MG capsule Take 1 capsule (20 mg total) by mouth daily. 90 capsule 3  . Propranolol HCl SR Beads (INDERAL XL PO) Take by mouth. Unsure of dose     No current facility-administered medications on file prior to visit.   Allergies  Allergen Reactions  . Morphine And Related Other (See Comments)    G.I. Upset  . Aspirin Other (See Comments)    G.I. Upset   . Demerol [Meperidine]     Gi upset,   . Latex Itching and Rash   Social History   Socioeconomic History  . Marital status: Married    Spouse name: Not on file  . Number of children: 2  . Years of education: Not on file  . Highest education level: Not on file  Occupational History  . Occupation: Heritage manager: [employer]  Tobacco Use  . Smoking status: Current Every Day Smoker    Packs/day: 0.50    Years: 36.00    Pack years: 18.00    Types: Cigarettes  . Smokeless tobacco: Never Used  Vaping Use  . Vaping Use: Never used  Substance and Sexual Activity  . Alcohol use: No  . Drug use: No  . Sexual activity: Yes  Other Topics Concern  . Not on file  Social History Narrative         She works as a Pharmacist, hospital at [employer].   Does no exercise.   Has smoked about 1/2 ppd since age 49 years old-- now this has been for 30 years.(as of 2015)   Social Determinants of Health   Financial Resource Strain: Not on file  Food Insecurity: Not on file  Transportation Needs: Not on file  Physical Activity: Not on file  Stress: Not on file  Social Connections: Not on file  Intimate Partner Violence: Not on file   Family History  Problem Relation Age of Onset  . Diabetes Father   . Heart attack Father   . Diabetes Brother   . Hypertension Brother   . Diabetes Brother   . Hypertension Brother   . Diabetes Brother   . Diabetes Brother   . Diabetes Brother   . Cancer Mother        unsure primary, deceased at age 47  .  Diabetes Other   . Colon cancer Neg Hx      Review of Systems  All other systems reviewed and are negative.      Objective:   Physical Exam Vitals reviewed.  Constitutional:      General: She is not in acute distress.    Appearance: Normal appearance. She is obese. She is not ill-appearing, toxic-appearing or diaphoretic.  HENT:     Head: Normocephalic and atraumatic.  Right Ear: Tympanic membrane and ear canal normal. There is no impacted cerumen.     Left Ear: Tympanic membrane and ear canal normal. There is no impacted cerumen.     Nose: Nose normal. No congestion or rhinorrhea.     Mouth/Throat:     Mouth: Mucous membranes are moist.     Pharynx: Oropharynx is clear. No oropharyngeal exudate or posterior oropharyngeal erythema.  Eyes:     General: No scleral icterus.       Right eye: No discharge.        Left eye: No discharge.     Extraocular Movements: Extraocular movements intact.     Conjunctiva/sclera: Conjunctivae normal.     Pupils: Pupils are equal, round, and reactive to light.  Neck:     Vascular: No carotid bruit.  Cardiovascular:     Rate and Rhythm: Normal rate and regular rhythm.     Pulses: Normal pulses.     Heart sounds: Normal heart sounds. No murmur heard. No friction rub. No gallop.   Pulmonary:     Effort: Pulmonary effort is normal. No respiratory distress.     Breath sounds: Normal breath sounds. No stridor. No wheezing, rhonchi or rales.  Chest:     Chest wall: No tenderness.  Abdominal:     General: Abdomen is flat. Bowel sounds are normal. There is no distension.     Palpations: Abdomen is soft. There is no mass.     Tenderness: There is no abdominal tenderness. There is no right CVA tenderness, left CVA tenderness, guarding or rebound.     Hernia: No hernia is present.  Musculoskeletal:     Cervical back: Normal range of motion and neck supple. No rigidity or tenderness.     Right lower leg: No edema.     Left lower leg: No edema.   Lymphadenopathy:     Cervical: No cervical adenopathy.  Skin:    General: Skin is warm.     Coloration: Skin is not jaundiced or pale.     Findings: No bruising, erythema, lesion or rash.  Neurological:     General: No focal deficit present.     Mental Status: She is alert and oriented to person, place, and time. Mental status is at baseline.     Cranial Nerves: No cranial nerve deficit.     Sensory: No sensory deficit.     Motor: No weakness.     Coordination: Coordination normal.     Gait: Gait normal.     Deep Tendon Reflexes: Reflexes normal.  Psychiatric:        Mood and Affect: Mood normal.        Behavior: Behavior normal.        Thought Content: Thought content normal.        Judgment: Judgment normal.           Assessment & Plan:  Encounter for screening mammogram for malignant neoplasm of breast - Plan: MM Digital Screening  HLD (hyperlipidemia) - Plan: atorvastatin (LIPITOR) 80 MG tablet  Atherosclerosis of native coronary artery of native heart with other form of angina pectoris (HCC) - Plan: atorvastatin (LIPITOR) 80 MG tablet, Hemoglobin A1c, CBC with Differential/Platelet, COMPLETE METABOLIC PANEL WITH GFR, Lipid panel, TSH  Aortic atherosclerosis (HCC) - Plan: atorvastatin (LIPITOR) 80 MG tablet  Smoker  Prediabetes  General medical exam  Regarding her cancer screening, she is due for mammogram.  She asked me to schedule this for her and I will  be glad to do it.  Colonoscopy is not due again until 2023.  She is overdue for a Pap smear but she declines this today because she prefers a female provider.  Regarding her immunizations, she is due for a flu shot, COVID-19 vaccination, Pneumovax 23, and Shingrix.  She politely declines all of these today.  Her blood pressures well controlled.  However regarding her risk factors, her cholesterol is extremely high.  She has moderate nonobstructive coronary artery disease.  Therefore I explained to the patient that  her smoking and her cholesterol or her 2 biggest risk factors.  I want her to resume Lipitor 80 mg a day immediately and then recheck fasting lab work in 3 months.  She also has a history of prediabetes so would like to recheck her A1c at that time.  Strongly encourage smoking cessation.  Also check CBC, CMP, TSH at that time as well.

## 2020-06-18 MED ORDER — ATORVASTATIN CALCIUM 80 MG PO TABS: 80.0000 mg | ORAL_TABLET | Freq: Every day | ORAL | 3 refills | Status: DC

## 2020-06-18 MED ORDER — OMEPRAZOLE 20 MG PO CPDR: 20.0000 mg | DELAYED_RELEASE_CAPSULE | Freq: Every day | ORAL | 3 refills | Status: DC

## 2020-06-18 MED ORDER — AMLODIPINE BESYLATE 2.5 MG PO TABS: 2.5000 mg | ORAL_TABLET | Freq: Every day | ORAL | 1 refills | Status: DC

## 2020-06-18 MED ORDER — ELETRIPTAN HYDROBROMIDE 40 MG PO TABS: ORAL_TABLET | ORAL | 1 refills | Status: DC

## 2020-06-18 NOTE — Addendum Note (Signed)
Addended by: Phillips Odor on: 06/18/2020 09:32 AM   Modules accepted: Orders

## 2020-08-04 ENCOUNTER — Other Ambulatory Visit: Payer: Self-pay | Admitting: Cardiology

## 2020-08-04 DIAGNOSIS — I2584 Coronary atherosclerosis due to calcified coronary lesion: Secondary | ICD-10-CM

## 2020-08-04 DIAGNOSIS — I2510 Atherosclerotic heart disease of native coronary artery without angina pectoris: Secondary | ICD-10-CM

## 2020-08-04 DIAGNOSIS — R9439 Abnormal result of other cardiovascular function study: Secondary | ICD-10-CM

## 2020-08-04 DIAGNOSIS — R0609 Other forms of dyspnea: Secondary | ICD-10-CM

## 2020-08-04 DIAGNOSIS — R0600 Dyspnea, unspecified: Secondary | ICD-10-CM

## 2020-08-09 ENCOUNTER — Other Ambulatory Visit: Payer: Self-pay

## 2020-08-09 DIAGNOSIS — I2510 Atherosclerotic heart disease of native coronary artery without angina pectoris: Secondary | ICD-10-CM

## 2020-08-09 DIAGNOSIS — R0600 Dyspnea, unspecified: Secondary | ICD-10-CM

## 2020-08-09 DIAGNOSIS — I2584 Coronary atherosclerosis due to calcified coronary lesion: Secondary | ICD-10-CM

## 2020-08-09 DIAGNOSIS — R0609 Other forms of dyspnea: Secondary | ICD-10-CM

## 2020-08-09 DIAGNOSIS — R9439 Abnormal result of other cardiovascular function study: Secondary | ICD-10-CM

## 2020-08-09 MED ORDER — METOPROLOL SUCCINATE ER 50 MG PO TB24: 50.0000 mg | ORAL_TABLET | Freq: Every day | ORAL | 2 refills | Status: DC

## 2020-08-30 ENCOUNTER — Encounter: Payer: Self-pay | Admitting: Cardiology

## 2020-08-30 ENCOUNTER — Ambulatory Visit (HOSPITAL_COMMUNITY)
Admission: RE | Admit: 2020-08-30 | Discharge: 2020-08-30 | Disposition: A | Discharge status: Home / Self Care | Payer: BC Managed Care – PPO | Source: Ambulatory Visit | Attending: Family Medicine | Admitting: Family Medicine

## 2020-08-30 ENCOUNTER — Other Ambulatory Visit: Payer: Self-pay

## 2020-08-30 ENCOUNTER — Ambulatory Visit: Payer: BC Managed Care – PPO | Admitting: Cardiology

## 2020-08-30 VITALS — BP 108/70 | HR 79 | Temp 98.2°F | Ht 62.0 in | Wt 152.0 lb

## 2020-08-30 DIAGNOSIS — E782 Mixed hyperlipidemia: Secondary | ICD-10-CM | POA: Diagnosis not present

## 2020-08-30 DIAGNOSIS — I25118 Atherosclerotic heart disease of native coronary artery with other forms of angina pectoris: Secondary | ICD-10-CM

## 2020-08-30 DIAGNOSIS — Z1231 Encounter for screening mammogram for malignant neoplasm of breast: Secondary | ICD-10-CM | POA: Diagnosis not present

## 2020-08-30 NOTE — Progress Notes (Signed)
Follow up visit  Subjective:   Jessica Shaw, female    DOB: March 30, 1966, 55 y.o.   MRN: [id number]   Chief Complaint  Patient presents with  . Palpitations  . Shortness of Breath  . Follow-up    HPI  55 year old Caucasian female with history of stroke and PFO, status post PFO closure in 2006, nonobstructive CAD.  Patient is doing well. She denies chest pain, shortness of breath, palpitations, leg edema, orthopnea, PND, TIA/syncope.   Current Outpatient Medications on File Prior to Visit  Medication Sig Dispense Refill  . amLODipine (NORVASC) 2.5 MG tablet Take 1 tablet (2.5 mg total) by mouth daily. 90 tablet 1  . atorvastatin (LIPITOR) 80 MG tablet Take 1 tablet (80 mg total) by mouth daily at 6 PM. 90 tablet 3  . eletriptan (RELPAX) 40 MG tablet TAKE 1 TABLET BY MOUTH ONCE DAILY AS NEEDED FOR  MIGRAINE **REPEAT IN 2 HOURS IF NEEDED** 60 tablet 1  . Ibuprofen 200 MG CAPS Take 400 mg by mouth daily as needed (migraines).     . metoprolol succinate (TOPROL-XL) 50 MG 24 hr tablet Take 1 tablet (50 mg total) by mouth daily. Take with or immediately following a meal. 90 tablet 2  . nitroGLYCERIN (NITROSTAT) 0.4 MG SL tablet Place 1 tablet (0.4 mg total) under the tongue every 5 (five) minutes as needed for chest pain. 90 tablet 3  . omeprazole (PRILOSEC) 20 MG capsule Take 1 capsule (20 mg total) by mouth daily. 90 capsule 3  . triamcinolone (KENALOG) 0.1 % Apply 1 application topically 2 (two) times daily. 30 g 0  . aspirin EC 81 MG tablet Take 81 mg by mouth daily. (Patient not taking: Reported on 08/30/2020)     No current facility-administered medications on file prior to visit.    Cardiovascular studies:  EKG 08/30/2020: Sinus rhythm 75 bpm Anteroseptal T wave inversion, consider ischemia  EKG 03/16/2019: Sinus rhythm 74 bpm. Normal EKG.  Coronary angiography 03/14/2019: LM: Normal LAD: Mid LAD 50% and ostial Diag 40% stenosis at LAD/Diag bifurcation. LAD tapers  rapidly in size after the bifurcation. dFR LAD: 0.90 Diag 0.97 LCx: Normal RCA: Distal RCA 20% disease.  LVEDP normal.  Moderate nonobstructive epicardial CAD. Possible microvascular disease, as evidenced by slow TIMI 2 flow in all vessels. Recommend medical management.   EKG 03/09/2019: Sinus rhythm 95 bpm with rate variation  Nonspecific ST-T changes  Exercise Sestamibi Stress Test 03/06/19  Patient exercised on Bruce protocol. The resting electrocardiogram demonstrated normal sinus rhythm, normal resting conduction. Patient exercised on Bruce protocol for 6:00  minutes and achieved  92 % of Max Predicted HR (Target HR was >85% MPHR) and 7.04 METS. Stress symptoms included fatigue. Normal BP response.  Exercise capacity was normal. Perfusion imaging study demonstrates a medium-sized moderate defect noted from the base towards the apex antero-septal wall.  This defect is not noted on rest images, consistent with ischemia.  Left ventricle systolic function calculated by QGS was moderately depressed at 37%.  There is septal hypokinesis.  High risk study. No previous exam available for comparison.   EKG 02/03/2019: Sinus rhythm 81 bpm. Normal EKG.  Echocardiogram 11/04/2017: Left ventricle cavity is normal in size. Normal global wall motion. Normal diastolic filling pattern. Calculated EF 56%. Left atrial cavity is normal in size. There is a ASD closure device without residual leak or thrombus. IVC is dilated with respiratory variation. May suggest elevated central venous pressure.  TEE 2006: - The left ventricle  was normal.  - The septal occluder is well visualized. There are 2 small mobile     thrombi noted on tee left atrial side. The left atrial     appendage function was normal (normal emptying velocity).     There was no left atrial appendage thrombus identified.  - The septal occluder device is located away from the pulmonary     vein. The pulmonary  veins were normal.    Recent labs: 09/08/2019: Glucose 95, BUN/Cr 19/0.55. EGFR >60. Na/K 139/4.1. H/H 14/43. MCV 93. Platelets 295  01/2019: Chol 259, TG 210, HDL 35, LDL 182 HbA1C 6.0% TSH 0.8 normal   Review of Systems  Cardiovascular: Negative for chest pain, dyspnea on exertion, leg swelling, palpitations and syncope.        Vitals:   08/30/20 1122  BP: 108/70  Pulse: 79  Temp: 98.2 F (36.8 C)  SpO2: 97%     Body mass index is 27.8 kg/m. Filed Weights   08/30/20 1122  Weight: 152 lb (68.9 kg)     Objective:   Physical Exam Vitals and nursing note reviewed.  Constitutional:      General: She is not in acute distress. Neck:     Vascular: No JVD.  Cardiovascular:     Rate and Rhythm: Normal rate and regular rhythm.     Heart sounds: Normal heart sounds. No murmur heard.   Pulmonary:     Effort: Pulmonary effort is normal.     Breath sounds: Normal breath sounds. No wheezing or rales.           Assessment & Recommendations:   55 year old Caucasian female with history of stroke and PFO, status post PFO closure in 2006, moderate nonobstructive CAD, probably microvascular dysfunction.  Nonobstructive CAD: Continue Aspirin, statin, amlodipine, metoprolol Check lipid panel. If LDL >100 on lipitor 80, would add Repatha  F/u in 1 year  Nigel Mormon, MD [facility] Pager: [phone] Office: [phone] If no answer Cell [phone]

## 2020-09-23 ENCOUNTER — Telehealth: Payer: Self-pay | Admitting: Family Medicine

## 2020-09-23 DIAGNOSIS — U071 COVID-19: Secondary | ICD-10-CM

## 2020-09-23 NOTE — Telephone Encounter (Signed)
Referral orders placed

## 2020-09-23 NOTE — Telephone Encounter (Signed)
Patient tested positive for COVID today at work; employer advised her to request the monoclonal antibodies. Please advise at [phone].

## 2020-09-24 ENCOUNTER — Encounter: Payer: Self-pay | Admitting: Adult Health

## 2020-09-24 ENCOUNTER — Other Ambulatory Visit: Payer: Self-pay | Admitting: Adult Health

## 2020-09-24 DIAGNOSIS — U071 COVID-19: Secondary | ICD-10-CM

## 2020-09-24 NOTE — Progress Notes (Signed)
I connected by phone with Jessica Shaw on 09/24/2020 at 3:05 PM to discuss the potential use of a new treatment for mild to moderate COVID-19 viral infection in non-hospitalized patients.  This patient is a 55 y.o. female that meets the FDA criteria for Emergency Use Authorization of COVID monoclonal antibody bebtelovimab.  Has a (+) direct SARS-CoV-2 viral test result  Has mild or moderate COVID-19   Is NOT hospitalized due to COVID-19  Is within 10 days of symptom onset  Has at least one of the high risk factor(s) for progression to severe COVID-19 and/or hospitalization as defined in EUA.  Specific high risk criteria : BMI > 25   Cost Reviewed  Sx onset 09/19/2020   I have spoken and communicated the following to the patient or parent/caregiver regarding COVID monoclonal antibody treatment:  1. FDA has authorized the emergency use for the treatment of mild to moderate COVID-19 in adults and pediatric patients with positive results of direct SARS-CoV-2 viral testing who are 70 years of age and older weighing at least 40 kg, and who are at high risk for progressing to severe COVID-19 and/or hospitalization.  2. The significant known and potential risks and benefits of COVID monoclonal antibody, and the extent to which such potential risks and benefits are unknown.  3. Information on available alternative treatments and the risks and benefits of those alternatives, including clinical trials.  4. Patients treated with COVID monoclonal antibody should continue to self-isolate and use infection control measures (e.g., wear mask, isolate, social distance, avoid sharing personal items, clean and disinfect "high touch" surfaces, and frequent handwashing) according to CDC guidelines.   5. The patient or parent/caregiver has the option to accept or refuse COVID monoclonal antibody treatment.  After reviewing this information with the patient, the patient has agreed to receive one of the  available covid 19 monoclonal antibodies and will be provided an appropriate fact sheet prior to infusion. Scot Dock, NP 09/24/2020 3:05 PM

## 2020-09-25 ENCOUNTER — Ambulatory Visit (HOSPITAL_COMMUNITY)
Admission: RE | Admit: 2020-09-25 | Discharge: 2020-09-25 | Disposition: A | Discharge status: Home / Self Care | Payer: BC Managed Care – PPO | Source: Ambulatory Visit | Attending: Pulmonary Disease | Admitting: Pulmonary Disease

## 2020-09-25 DIAGNOSIS — U071 COVID-19: Secondary | ICD-10-CM | POA: Diagnosis not present

## 2020-09-25 MED ORDER — EPINEPHRINE 0.3 MG/0.3ML IJ SOAJ: 0.3000 mg | Freq: Once | INTRAMUSCULAR | Status: DC | PRN

## 2020-09-25 MED ORDER — SODIUM CHLORIDE 0.9 % IV SOLN: INTRAVENOUS | Status: DC | PRN

## 2020-09-25 MED ORDER — FAMOTIDINE IN NACL 20-0.9 MG/50ML-% IV SOLN: 20.0000 mg | Freq: Once | INTRAVENOUS | Status: DC | PRN

## 2020-09-25 MED ORDER — DIPHENHYDRAMINE HCL 50 MG/ML IJ SOLN: 50.0000 mg | Freq: Once | INTRAMUSCULAR | Status: DC | PRN

## 2020-09-25 MED ORDER — BEBTELOVIMAB 175 MG/2 ML IV (EUA)
175.0000 mg | Freq: Once | INTRAMUSCULAR | Status: AC
  Administered 2020-09-25: 175 mg via INTRAVENOUS

## 2020-09-25 MED ORDER — METHYLPREDNISOLONE SODIUM SUCC 125 MG IJ SOLR: 125.0000 mg | Freq: Once | INTRAMUSCULAR | Status: DC | PRN

## 2020-09-25 MED ORDER — ALBUTEROL SULFATE HFA 108 (90 BASE) MCG/ACT IN AERS: 2.0000 | INHALATION_SPRAY | Freq: Once | RESPIRATORY_TRACT | Status: DC | PRN

## 2020-09-25 NOTE — Progress Notes (Signed)
Patient reviewed Fact Sheet for Patients, Parents, and Caregivers for Emergency Use Authorization (EUA) of bebtelovimab for the Treatment of Coronavirus. Patient also reviewed and is agreeable to the estimated cost of treatment. Patient is agreeable to proceed.

## 2020-09-25 NOTE — Progress Notes (Signed)
Diagnosis: COVID-19  Physician: Dr. Patrick Wright  Procedure: Covid Infusion Clinic Med: Bebtelovimab Provided patient with Bebtelovimab fact sheet for patients, parents, and caregivers prior to infusion.   Complications: No immediate complications noted  Discharge: Discharged home    

## 2020-09-25 NOTE — Discharge Instructions (Signed)
10 Things You Can Do to Manage Your COVID-19 Symptoms at Home If you have possible or confirmed COVID-19: 1. Stay home except to get medical care. 2. Monitor your symptoms carefully. If your symptoms get worse, call your healthcare provider immediately. 3. Get rest and stay hydrated. 4. If you have a medical appointment, call the healthcare provider ahead of time and tell them that you have or may have COVID-19. 5. For medical emergencies, call 911 and notify the dispatch personnel that you have or may have COVID-19. 6. Cover your cough and sneezes with a tissue or use the inside of your elbow. 7. Wash your hands often with soap and water for at least 20 seconds or clean your hands with an alcohol-based hand sanitizer that contains at least 60% alcohol. 8. As much as possible, stay in a specific room and away from other people in your home. Also, you should use a separate bathroom, if available. If you need to be around other people in or outside of the home, wear a mask. 9. Avoid sharing personal items with other people in your household, like dishes, towels, and bedding. 10. Clean all surfaces that are touched often, like counters, tabletops, and doorknobs. Use household cleaning sprays or wipes according to the label instructions. cdc.gov/coronavirus 12/29/2019 This information is not intended to replace advice given to you by your health care provider. Make sure you discuss any questions you have with your health care provider. Document Revised: 04/15/2020 Document Reviewed: 04/15/2020 Elsevier Patient Education  2021 Elsevier Inc.  What types of side effects do monoclonal antibody drugs cause?  Common side effects  In general, the more common side effects caused by monoclonal antibody drugs include: . Allergic reactions, such as hives or itching . Flu-like signs and symptoms, including chills, fatigue, fever, and muscle aches and pains . Nausea, vomiting . Diarrhea . Skin  rashes . Low blood pressure   The CDC is recommending patients who receive monoclonal antibody treatments wait at least 90 days before being vaccinated.  Currently, there are no data on the safety and efficacy of mRNA COVID-19 vaccines in persons who received monoclonal antibodies or convalescent plasma as part of COVID-19 treatment. Based on the estimated half-life of such therapies as well as evidence suggesting that reinfection is uncommon in the 90 days after initial infection, vaccination should be deferred for at least 90 days, as a precautionary measure until additional information becomes available, to avoid interference of the antibody treatment with vaccine-induced immune responses.   If someone you know is interested in receiving treatment please have them contact their MD for a referral or visit [email or web address]    

## 2020-12-10 ENCOUNTER — Other Ambulatory Visit: Payer: Self-pay | Admitting: Family Medicine

## 2020-12-10 DIAGNOSIS — I25118 Atherosclerotic heart disease of native coronary artery with other forms of angina pectoris: Secondary | ICD-10-CM

## 2021-03-12 ENCOUNTER — Other Ambulatory Visit: Payer: Self-pay | Admitting: Family Medicine

## 2021-03-12 DIAGNOSIS — I25118 Atherosclerotic heart disease of native coronary artery with other forms of angina pectoris: Secondary | ICD-10-CM

## 2021-03-13 ENCOUNTER — Other Ambulatory Visit: Payer: Self-pay

## 2021-03-13 DIAGNOSIS — I25118 Atherosclerotic heart disease of native coronary artery with other forms of angina pectoris: Secondary | ICD-10-CM

## 2021-05-02 ENCOUNTER — Ambulatory Visit: Payer: BC Managed Care – PPO | Admitting: Nurse Practitioner

## 2021-05-02 ENCOUNTER — Other Ambulatory Visit: Payer: Self-pay

## 2021-05-02 ENCOUNTER — Telehealth: Payer: Self-pay | Admitting: Family Medicine

## 2021-05-02 ENCOUNTER — Encounter: Payer: Self-pay | Admitting: Nurse Practitioner

## 2021-05-02 VITALS — BP 122/82 | HR 76 | Temp 97.4°F | Ht 62.0 in | Wt 154.4 lb

## 2021-05-02 DIAGNOSIS — Z716 Tobacco abuse counseling: Secondary | ICD-10-CM

## 2021-05-02 DIAGNOSIS — H9201 Otalgia, right ear: Secondary | ICD-10-CM

## 2021-05-02 MED ORDER — PREDNISONE 20 MG PO TABS: 20.0000 mg | ORAL_TABLET | Freq: Every day | ORAL | 0 refills | Status: AC

## 2021-05-02 MED ORDER — FLUTICASONE PROPIONATE 50 MCG/ACT NA SUSP: 2.0000 | Freq: Every day | NASAL | 6 refills | Status: DC

## 2021-05-02 NOTE — Telephone Encounter (Signed)
Please advise 

## 2021-05-02 NOTE — Progress Notes (Signed)
Subjective:    Patient ID: Jessica Shaw, female    DOB: [birth year], 55 y.o.   MRN: [id number]  HPI: Jessica Shaw is a 55 y.o. female presenting for headache and neck pain.  Chief Complaint  Patient presents with   Follow-up    Right side head neck and ear/jaw pain    NECK PAIN Onset: > 2 months Location: right side to top of head Duration: constant Severity: 7/10 Quality: aching Frequency: constant Radiation: headache Aggravating factors: no Alleviating factors: no Status: worse Treatments attempted: Advil Relief with NSAIDs?:  does not know Fevers:  no Body aches: no Chills: no  EAR PAIN Fever: no Cough: no Shortness of breath: no Wheezing: yes; worse when laying down at night Chest pain: no Chest tightness: no Chest congestion: no Nasal congestion: no Runny nose: no Post nasal drip: no Sneezing: no Sore throat: no Swollen glands: yes Sinus pressure: no Headache: yes Face pain: no Toothache: no Ear pain: yes : right ear Ear pressure: no  Eyes red/itching:no Eye drainage/crusting: no  Nausea: no  Vomiting: no Diarrhea: no  Change in appetite: no  Loss of taste/smell: no  Rash: no Fatigue: no   TOBACCO USER Patient is interested in smoking cessation today. Smoking Status: current every day smoker Smoking Amount: 0.5 ppd Smoking Onset: 36 years ago Smoking Quit Date: TBD Smoking triggers: stress, family members Type of tobacco use: cigarettes  Allergies  Allergen Reactions   Morphine And Related Other (See Comments)    G.I. Upset   Aspirin Other (See Comments)    G.I. Upset    Demerol [Meperidine]     Gi upset,    Latex Itching and Rash    Outpatient Encounter Medications as of 05/02/2021  Medication Sig Note   amLODipine (NORVASC) 2.5 MG tablet Take 1 tablet by mouth once daily    aspirin EC 81 MG tablet Take 81 mg by mouth daily. 09/05/2019: On hold for procedure   atorvastatin (LIPITOR) 80 MG tablet Take 1 tablet (80 mg  total) by mouth daily at 6 PM.    eletriptan (RELPAX) 40 MG tablet TAKE 1 TABLET BY MOUTH ONCE DAILY AS NEEDED FOR  MIGRAINE **REPEAT IN 2 HOURS IF NEEDED**    fluticasone (FLONASE) 50 MCG/ACT nasal spray Place 2 sprays into both nostrils daily.    Ibuprofen 200 MG CAPS Take 400 mg by mouth daily as needed (migraines).     omeprazole (PRILOSEC) 20 MG capsule Take 1 capsule (20 mg total) by mouth daily.    predniSONE (DELTASONE) 20 MG tablet Take 1 tablet (20 mg total) by mouth daily with breakfast for 5 days.    triamcinolone (KENALOG) 0.1 % Apply 1 application topically 2 (two) times daily.    [DISCONTINUED] metoprolol succinate (TOPROL-XL) 50 MG 24 hr tablet Take 1 tablet (50 mg total) by mouth daily. Take with or immediately following a meal.    nitroGLYCERIN (NITROSTAT) 0.4 MG SL tablet Place 1 tablet (0.4 mg total) under the tongue every 5 (five) minutes as needed for chest pain.    No facility-administered encounter medications on file as of 05/02/2021.    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 Interim medical history since our last visit reviewed.  Review of Systems Per HPI unless specifically indicated above     Objective:    BP 122/82   Pulse 76   Temp (!) 97.4 F (36.3 C)   Ht 5\' 2"  (1.575 m)   Wt 154 lb 6.4 oz (70 kg)   SpO2 99%   BMI 28.24 kg/m   Wt Readings from Last 3 Encounters:  05/02/21 154 lb 6.4 oz (70 kg)  08/30/20 152 lb (68.9 kg)  06/17/20 149 lb (67.6 kg)    Physical Exam Vitals and nursing note reviewed.  Constitutional:      General: She is not in acute distress.    Appearance: Normal appearance. She is not toxic-appearing.  HENT:     Head: Normocephalic and atraumatic.     Salivary Glands: Right salivary gland is not diffusely enlarged. Left salivary gland is not diffusely enlarged.     Right Ear: No drainage or swelling. A middle ear effusion is present. There is no impacted cerumen. No mastoid tenderness. Tympanic membrane is not injected, scarred, perforated, erythematous, retracted or bulging.     Left Ear: Tympanic membrane, ear canal and external ear normal.     Nose: Nose normal. No congestion.     Mouth/Throat:     Mouth: Mucous membranes are moist.     Pharynx: Oropharynx is clear. No oropharyngeal exudate or posterior oropharyngeal erythema.  Eyes:     General: No scleral icterus.       Right eye: No discharge.        Left eye: No discharge.     Extraocular Movements: Extraocular movements intact.     Pupils: Pupils are equal, round, and reactive to light.  Cardiovascular:     Rate and Rhythm: Normal rate and regular rhythm.     Heart sounds: Normal heart sounds. No murmur heard. Pulmonary:     Effort: Pulmonary effort is normal. No respiratory distress.     Breath sounds:  Normal breath sounds. No wheezing, rhonchi or rales.  Musculoskeletal:     Cervical back: Normal range of motion and neck supple.  Lymphadenopathy:     Cervical: No cervical adenopathy.  Skin:    General: Skin is warm and dry.     Capillary Refill: Capillary refill takes less than 2 seconds.     Coloration: Skin is not jaundiced or pale.     Findings: No erythema.  Neurological:     Mental Status: She is alert and oriented to person,  place, and time.     Sensory: No sensory deficit.     Motor: No weakness.     Coordination: Coordination normal.     Gait: Gait normal.  Psychiatric:        Mood and Affect: Mood normal.        Behavior: Behavior normal.        Thought Content: Thought content normal.        Judgment: Judgment normal.      Assessment & Plan:  1. Encounter for smoking cessation counseling Discussed smoking cessation.  Unfortunately, wellbutrin interacts with metoprolol, so we cannot use this medication.  She is not interested in Chantix.  Discussed cutting back to quit method decreasing by 1 cigarette per week daily and replacing with sugar free sucker/mint.    2. Right ear pain Acute.  Unclear cause,  suspect mild eustachian tube dysfunction vs. ?tension/sinus headache.  Start flonase twice daily and prednisone burst.  Follow up with no improvement in symptoms. No s/s bacterial infection today.  - predniSONE (DELTASONE) 20 MG tablet; Take 1 tablet (20 mg total) by mouth daily with breakfast for 5 days.  Dispense: 5 tablet; Refill: 0 - fluticasone (FLONASE) 50 MCG/ACT nasal spray; Place 2 sprays into both nostrils daily.  Dispense: 16 g; Refill: 6     Follow up plan: Return if symptoms worsen or fail to improve.

## 2021-05-02 NOTE — Telephone Encounter (Signed)
Patient called to follow up on meds prescribed by provider today. Patient at pharmacy; meds haven't been sent over.   Pharmacy confirmed as   [facility] [address], [place] - [place]  [address] [place], [place] [address]  Phone:  [phone]  Fax:  [phone]   Please advise at [phone].

## 2021-05-02 NOTE — Telephone Encounter (Signed)
Just sent

## 2021-05-05 ENCOUNTER — Other Ambulatory Visit: Payer: Self-pay | Admitting: Cardiology

## 2021-05-05 DIAGNOSIS — I2584 Coronary atherosclerosis due to calcified coronary lesion: Secondary | ICD-10-CM

## 2021-05-05 DIAGNOSIS — R0609 Other forms of dyspnea: Secondary | ICD-10-CM

## 2021-05-05 DIAGNOSIS — I2510 Atherosclerotic heart disease of native coronary artery without angina pectoris: Secondary | ICD-10-CM

## 2021-05-05 DIAGNOSIS — R9439 Abnormal result of other cardiovascular function study: Secondary | ICD-10-CM

## 2021-06-04 ENCOUNTER — Other Ambulatory Visit: Payer: Self-pay | Admitting: Family Medicine

## 2021-06-04 DIAGNOSIS — K219 Gastro-esophageal reflux disease without esophagitis: Secondary | ICD-10-CM

## 2021-06-04 DIAGNOSIS — I25118 Atherosclerotic heart disease of native coronary artery with other forms of angina pectoris: Secondary | ICD-10-CM

## 2021-08-01 ENCOUNTER — Other Ambulatory Visit: Payer: Self-pay | Admitting: Cardiology

## 2021-08-01 DIAGNOSIS — I2510 Atherosclerotic heart disease of native coronary artery without angina pectoris: Secondary | ICD-10-CM

## 2021-08-01 DIAGNOSIS — R9439 Abnormal result of other cardiovascular function study: Secondary | ICD-10-CM

## 2021-08-01 DIAGNOSIS — R0609 Other forms of dyspnea: Secondary | ICD-10-CM

## 2021-08-05 ENCOUNTER — Other Ambulatory Visit (HOSPITAL_COMMUNITY): Payer: Self-pay | Admitting: Family Medicine

## 2021-08-05 DIAGNOSIS — Z1231 Encounter for screening mammogram for malignant neoplasm of breast: Secondary | ICD-10-CM

## 2021-08-29 ENCOUNTER — Other Ambulatory Visit: Payer: Self-pay

## 2021-08-29 ENCOUNTER — Ambulatory Visit: Payer: BC Managed Care – PPO | Admitting: Cardiology

## 2021-08-29 ENCOUNTER — Encounter: Payer: Self-pay | Admitting: Cardiology

## 2021-08-29 VITALS — BP 139/76 | HR 67 | Temp 97.7°F | Resp 16 | Ht 62.0 in | Wt 157.0 lb

## 2021-08-29 DIAGNOSIS — I25118 Atherosclerotic heart disease of native coronary artery with other forms of angina pectoris: Secondary | ICD-10-CM | POA: Diagnosis not present

## 2021-08-29 DIAGNOSIS — I10 Essential (primary) hypertension: Secondary | ICD-10-CM

## 2021-08-29 DIAGNOSIS — E782 Mixed hyperlipidemia: Secondary | ICD-10-CM

## 2021-08-29 DIAGNOSIS — M79602 Pain in left arm: Secondary | ICD-10-CM | POA: Diagnosis not present

## 2021-08-29 NOTE — Progress Notes (Signed)
? ?Follow up visit ? ?Subjective:  ? ?Jessica Shaw, female    DOB: 01/23/1966, 56 y.o.   MRN: [id number] ? ? ?Chief Complaint  ?Patient presents with  ? Coronary Artery Disease  ? Follow-up  ?  1 year  ? ? ?HPI ? ?56 year old Caucasian female with history of stroke and PFO, s/p PFO (2006), nonobstructive CAD, h/o DVT ? ?Patient is doing well. She denies chest pain, shortness of breath, palpitations, leg edema, orthopnea, PND, TIA/syncope.  She has changed jobs to a sedentary job and does not walk or do any regular physical activity.  She has recently had pain in her left arm, associated with burning.  She has a prior history of DVT in the same arm.  She is concerned about recurrence of DVT. ? ? ?Current Outpatient Medications:  ?  amLODipine (NORVASC) 2.5 MG tablet, Take 1 tablet by mouth once daily, Disp: 90 tablet, Rfl: 0 ?  atorvastatin (LIPITOR) 80 MG tablet, Take 1 tablet (80 mg total) by mouth daily at 6 PM., Disp: 90 tablet, Rfl: 3 ?  eletriptan (RELPAX) 40 MG tablet, TAKE 1 TABLET BY MOUTH ONCE DAILY AS NEEDED FOR  MIGRAINE **REPEAT IN 2 HOURS IF NEEDED**, Disp: 60 tablet, Rfl: 1 ?  metoprolol succinate (TOPROL-XL) 50 MG 24 hr tablet, TAKE 1 TABLET BY MOUTH ONCE DAILY WITH  MEAL  OR  IMMEDIATELY  FOLLOWING  A  MEAL, Disp: 90 tablet, Rfl: 0 ?  nitroGLYCERIN (NITROSTAT) 0.4 MG SL tablet, Place 1 tablet (0.4 mg total) under the tongue every 5 (five) minutes as needed for chest pain., Disp: 90 tablet, Rfl: 3 ?  omeprazole (PRILOSEC) 20 MG capsule, Take 1 capsule by mouth once daily, Disp: 90 capsule, Rfl: 0 ?  triamcinolone (KENALOG) 0.1 %, Apply 1 application topically 2 (two) times daily., Disp: 30 g, Rfl: 0 ? ? ? ?Cardiovascular studies: ? ?EKG 08/29/2021: ?Sinus rhythm 69 bpm ?Normal EKG ? ?Coronary angiography 03/14/2019: ?LM: Normal ?LAD: Mid LAD 50% and ostial Diag 40% stenosis at LAD/Diag bifurcation. LAD tapers rapidly in size after the bifurcation. ?dFR ?LAD: 0.90 ?Diag 0.97 ?LCx: Normal ?RCA: Distal  RCA 20% disease. ?  ?LVEDP normal. ?  ?Moderate nonobstructive epicardial CAD. ?Possible microvascular disease, as evidenced by slow TIMI 2 flow in all vessels. ?Recommend medical management.  ? ?Exercise Sestamibi Stress Test 03/06/2019:  ?Patient exercised on Bruce protocol. The resting electrocardiogram demonstrated normal sinus rhythm, normal resting conduction. Patient exercised on Bruce protocol for 6:00  minutes and achieved  92 % of Max Predicted HR (Target HR was >85% MPHR) and 7.04 METS. Stress symptoms included fatigue. Normal BP response.  Exercise capacity was normal. ?Perfusion imaging study demonstrates a medium-sized moderate defect noted from the base towards the apex antero-septal wall.  This defect is not noted on rest images, consistent with ischemia.  Left ventricle systolic function calculated by QGS was moderately depressed at 37%.  There is septal hypokinesis.  ?High risk study. No previous exam available for comparison.  ? ?Echocardiogram 11/04/2017: ?Left ventricle cavity is normal in size. Normal global wall motion. Normal diastolic filling pattern. Calculated EF 56%. ?Left atrial cavity is normal in size. There is a ASD closure device without residual leak or thrombus. ?IVC is dilated with respiratory variation. May suggest elevated central venous pressure. ? ?TEE 2006: ? -  The left ventricle was normal.  ? -  The septal occluder is well visualized. There are 2 small mobile  ?  thrombi noted on tee left atrial side. The left atrial  ?       appendage function was normal (normal emptying velocity).  ?       There was no left atrial appendage thrombus identified.  ? -  The septal occluder device is located away from the pulmonary  ?       vein. The pulmonary veins were normal.  ? ? ?Recent labs: ?09/08/2019: ?Glucose 95, BUN/Cr 19/0.55. EGFR >60. Na/K 139/4.1. ?H/H 14/43. MCV 93. Platelets 295 ? ?01/2019: ?Chol 259, TG 210, HDL 35, LDL 182 ?HbA1C 6.0% ?TSH 0.8 normal ? ? ?Review of  Systems  ?Cardiovascular:  Negative for chest pain, dyspnea on exertion, leg swelling, palpitations and syncope.  ? ?   ? ?Vitals:  ? 08/29/21 0906  ?BP: 139/76  ?Pulse: 67  ?Resp: 16  ?Temp: 97.7 ?F (36.5 ?C)  ?SpO2: 97%  ? ? ? ?Body mass index is 28.72 kg/m?. Jessica Shaw Weights  ? 08/29/21 0906  ?Weight: 157 lb (71.2 kg)  ? ? ? ?Objective:  ? Physical Exam ?Vitals and nursing note reviewed.  ?Constitutional:   ?   General: She is not in acute distress. ?Neck:  ?   Vascular: No JVD.  ?Cardiovascular:  ?   Rate and Rhythm: Normal rate and regular rhythm.  ?   Heart sounds: Normal heart sounds. No murmur heard. ?Pulmonary:  ?   Effort: Pulmonary effort is normal.  ?   Breath sounds: Normal breath sounds. No wheezing or rales.  ? ? ?  ICD-10-CM   ?1. Coronary artery disease of native artery of native heart with stable angina pectoris ([place])  I25.118 EKG 12-Lead  ?  Lipid panel  ?  ?2. Left arm pain  M79.602 PCV UPPER VENOUS US (UNILATERAL)  ?  ?3. Mixed hyperlipidemia  E78.2   ?  ?4. Essential hypertension  I10   ?  ? ? ? ? ?   ?Assessment & Recommendations:  ? ?56 year old Caucasian female with history of stroke and PFO, s/p PFO (2006), nonobstructive CAD, h/o DVT ? ?Left arm pain: ?Prior history of DVT.  We will check venous ultrasound left upper extremity. ? ?Hypertension: ?Blood pressure slightly elevated today.  Encouraged regular physical activity, 10,000 steps walking every day. ?No change made to antihypertensive therapy. ? ?Nonobstructive CAD: ?Continue statin, amlodipine, metoprolol ?She is not taking aspirin due to GI intolerance. ?Check lipid panel ? ?F/u in 1 year ? ?Nigel Mormon, MD ?[facility] ?Pager: [phone] ?Office: [phone] ?If no answer Cell [phone] ?   ?

## 2021-08-30 LAB — LIPID PANEL
Chol/HDL Ratio: 3.3 ratio (ref 0.0–4.4)
Cholesterol, Total: 146 mg/dL (ref 100–199)
HDL: 44 mg/dL (ref >39)
LDL Chol Calc (NIH): 86 mg/dL (ref 0–99)
Triglycerides: 80 mg/dL (ref 0–149)
VLDL Cholesterol Cal: 16 mg/dL (ref 5–40)

## 2021-09-01 ENCOUNTER — Ambulatory Visit (HOSPITAL_COMMUNITY)
Admission: RE | Admit: 2021-09-01 | Discharge: 2021-09-01 | Disposition: A | Discharge status: Home / Self Care | Payer: BC Managed Care – PPO | Source: Ambulatory Visit | Attending: Family Medicine | Admitting: Family Medicine

## 2021-09-01 ENCOUNTER — Other Ambulatory Visit: Payer: Self-pay

## 2021-09-01 DIAGNOSIS — Z1231 Encounter for screening mammogram for malignant neoplasm of breast: Secondary | ICD-10-CM | POA: Insufficient documentation

## 2021-09-02 ENCOUNTER — Other Ambulatory Visit: Payer: Self-pay | Admitting: Family Medicine

## 2021-09-02 DIAGNOSIS — I25118 Atherosclerotic heart disease of native coronary artery with other forms of angina pectoris: Secondary | ICD-10-CM

## 2021-09-02 DIAGNOSIS — K219 Gastro-esophageal reflux disease without esophagitis: Secondary | ICD-10-CM

## 2021-09-02 DIAGNOSIS — E785 Hyperlipidemia, unspecified: Secondary | ICD-10-CM

## 2021-09-02 DIAGNOSIS — I700 Atherosclerosis of aorta: Secondary | ICD-10-CM

## 2021-09-09 ENCOUNTER — Ambulatory Visit: Payer: BC Managed Care – PPO

## 2021-09-09 DIAGNOSIS — M79602 Pain in left arm: Secondary | ICD-10-CM | POA: Diagnosis not present

## 2021-09-10 ENCOUNTER — Other Ambulatory Visit: Payer: Self-pay

## 2021-09-30 ENCOUNTER — Encounter: Payer: Self-pay | Admitting: *Deleted

## 2021-10-23 ENCOUNTER — Encounter: Payer: Self-pay | Admitting: *Deleted

## 2021-11-02 ENCOUNTER — Other Ambulatory Visit: Payer: Self-pay | Admitting: Cardiology

## 2021-11-02 ENCOUNTER — Other Ambulatory Visit: Payer: Self-pay | Admitting: Family Medicine

## 2021-11-02 DIAGNOSIS — I2510 Atherosclerotic heart disease of native coronary artery without angina pectoris: Secondary | ICD-10-CM

## 2021-11-02 DIAGNOSIS — R9439 Abnormal result of other cardiovascular function study: Secondary | ICD-10-CM

## 2021-11-02 DIAGNOSIS — G43009 Migraine without aura, not intractable, without status migrainosus: Secondary | ICD-10-CM

## 2021-11-02 DIAGNOSIS — R0609 Other forms of dyspnea: Secondary | ICD-10-CM

## 2021-11-04 NOTE — Telephone Encounter (Signed)
Requested Prescriptions  Pending Prescriptions Disp Refills  . eletriptan (RELPAX) 40 MG tablet [Pharmacy Med Name: Eletriptan Hydrobromide 40 MG Oral Tablet] 6 tablet 0    Sig: TAKE 1 TABLET BY MOUTH ONCE DAILY AS NEEDED FOR MIGRAINE - REPEAT IN 2 HOURS IF NEEDED     Neurology:  Migraine Therapy - Triptan Passed - 11/02/2021  9:32 AM      Passed - Last BP in normal range    BP Readings from Last 1 Encounters:  08/29/21 139/76         Passed - Valid encounter within last 12 months    Recent Outpatient Visits          6 months ago Encounter for smoking cessation counseling   [place] Eulogio Bear, NP   1 year ago Encounter for screening mammogram for malignant neoplasm of breast   [place] Pickard, Cammie Mcgee, MD   2 years ago Palpitations   [place] Delsa Grana, PA-C   3 years ago Acute pain of right shoulder   [place] Delsa Grana, PA-C   3 years ago Fatigue, unspecified type   [place] Delsa Grana, PA-C      Future Appointments            In 1 week Pickard, Cammie Mcgee, MD [facility]   In 10 months Patwardhan, Reynold Bowen, MD [facility]

## 2021-11-04 NOTE — Telephone Encounter (Signed)
Patient called and advised she's due for a physical, last on 06/17/21, she verbalized understanding. Appointment scheduled for Tuesday, 11/11/21 at 1200, advised fasting after MN for labs.

## 2021-11-11 ENCOUNTER — Ambulatory Visit (INDEPENDENT_AMBULATORY_CARE_PROVIDER_SITE_OTHER): Payer: BC Managed Care – PPO | Admitting: Family Medicine

## 2021-11-11 VITALS — BP 118/80 | HR 84 | Temp 97.6°F | Ht 62.0 in | Wt 152.6 lb

## 2021-11-11 DIAGNOSIS — I25118 Atherosclerotic heart disease of native coronary artery with other forms of angina pectoris: Secondary | ICD-10-CM

## 2021-11-11 DIAGNOSIS — I10 Essential (primary) hypertension: Secondary | ICD-10-CM | POA: Diagnosis not present

## 2021-11-11 DIAGNOSIS — Z0000 Encounter for general adult medical examination without abnormal findings: Secondary | ICD-10-CM

## 2021-11-11 DIAGNOSIS — Z124 Encounter for screening for malignant neoplasm of cervix: Secondary | ICD-10-CM

## 2021-11-11 DIAGNOSIS — D126 Benign neoplasm of colon, unspecified: Secondary | ICD-10-CM

## 2021-11-11 DIAGNOSIS — G43009 Migraine without aura, not intractable, without status migrainosus: Secondary | ICD-10-CM

## 2021-11-11 DIAGNOSIS — Z8673 Personal history of transient ischemic attack (TIA), and cerebral infarction without residual deficits: Secondary | ICD-10-CM

## 2021-11-11 DIAGNOSIS — F17200 Nicotine dependence, unspecified, uncomplicated: Secondary | ICD-10-CM | POA: Diagnosis not present

## 2021-11-11 DIAGNOSIS — Z8774 Personal history of (corrected) congenital malformations of heart and circulatory system: Secondary | ICD-10-CM

## 2021-11-11 NOTE — Progress Notes (Addendum)
Subjective:    Patient ID: Jessica Shaw, female    DOB: April 20, 1966, 56 y.o.   MRN: [id number]  HPI patient is a very pleasant 56 year old Caucasian female who is here today for complete physical exam.  She has a history of a stroke and ultimately underwent a PFO closure.  She also has a history of nonobstructive coronary artery disease catheterization in 2020.  She is currently using triptans as needed for migraines.  We discussed the risk of coronary vasospasms and ischemia due to triptans and I would recommend against this given her history of coronary artery disease.  She also has a history of hyperlipidemia for which she is on statin as well as tobacco abuse.  Quit smoking in February.  Due for pap smear.  Last colonoscopy 2018 (q 5 years) Mammogram 3/23 Past Medical History:  Diagnosis Date  . Complication of anesthesia   . Coronary artery disease    PFO closure 2006  . Embolism - blood clot    arm   left   . GERD (gastroesophageal reflux disease)   . HLD (hyperlipidemia)   . Migraine   . PONV (postoperative nausea and vomiting)   . Sleep apnea    mild OSA, no CPAP  . Smoker   . Spinal cord stimulator status 2006   in lower back, pt patient  . Stroke [facility])    TIA, no deficits   Past Surgical History:  Procedure Laterality Date  . CARDIAC CATHETERIZATION    . CHOLECYSTECTOMY    . COLONOSCOPY  08/04/2005   [facility]:NOBSJ external hemorrhoids and anal papilla, otherwise normal  . COLONOSCOPY N/A 10/07/2012   Procedure: COLONOSCOPY;  Surgeon: Daneil Dolin, MD;  [facility];  Service: Endoscopy;  Laterality: N/A;  10:30  . COLONOSCOPY N/A 10/13/2016   Procedure: COLONOSCOPY;  Surgeon: Daneil Dolin, MD;  [facility];  Service: Endoscopy;  Laterality: N/A;  9:00am  . ESOPHAGOGASTRODUODENOSCOPY  04/20/2003   [facility]:ZMOQ changes of reflux esophagitis/Limited gastroesophageal junction, otherwise normal  . ESOPHAGOGASTRODUODENOSCOPY N/A 10/07/2012   Procedure:  ESOPHAGOGASTRODUODENOSCOPY (EGD);  Surgeon: Daneil Dolin, MD;  [facility];  Service: Endoscopy;  Laterality: N/A;  . INTRAVASCULAR PRESSURE WIRE/FFR STUDY N/A 03/14/2019   Procedure: INTRAVASCULAR PRESSURE WIRE/FFR STUDY;  Surgeon: Nigel Mormon, MD;  Location: [place] [facility];  Service: Cardiovascular;  Laterality: N/A;  . LEFT HEART CATH AND CORONARY ANGIOGRAPHY N/A 03/14/2019   Procedure: LEFT HEART CATH AND CORONARY ANGIOGRAPHY;  Surgeon: Nigel Mormon, MD;  Location: [place] [facility];  Service: Cardiovascular;  Laterality: N/A;  . PATENT FORAMEN OVALE CLOSURE    . POLYPECTOMY  10/13/2016   Procedure: POLYPECTOMY;  Surgeon: Daneil Dolin, MD;  [facility];  Service: Endoscopy;;  colon  . SHOULDER ARTHROSCOPY     left  . SPINAL CORD STIMULATOR IMPLANT    . TUBAL LIGATION    . WRIST ARTHROSCOPY WITH DEBRIDEMENT Right 06/23/2018   Procedure: RIGHT ARTHROSCOPY WRIST DEBRIDEMENT LUNO TRIQUETRAL TEAR SHRINKAGE;  Surgeon: Daryll Brod, MD;  Location: [facility];  Service: Orthopedics;  Laterality: Right;  AXILLARY BLAOCK   Current Outpatient Medications on File Prior to Visit  Medication Sig Dispense Refill  . amLODipine (NORVASC) 2.5 MG tablet Take 1 tablet by mouth once daily 90 tablet 1  . atorvastatin (LIPITOR) 80 MG tablet GIVE 1 TABLET BY MOUTH ONCE DAILY AT 6PM. 90 tablet 1  . eletriptan (RELPAX) 40 MG tablet  TAKE 1 TABLET BY MOUTH ONCE DAILY AS NEEDED FOR MIGRAINE - REPEAT IN 2 HOURS IF NEEDED 6 tablet 0  . metoprolol succinate (TOPROL-XL) 50 MG 24 hr tablet TAKE 1 TABLET BY MOUTH ONCE DAILY WITH MEAL OR IMMEDIATELY FOLLOWING A MEAL 90 tablet 0  . omeprazole (PRILOSEC) 20 MG capsule Take 1 capsule by mouth once daily 90 capsule 1  . triamcinolone (KENALOG) 0.1 % Apply 1 application topically 2 (two) times daily. 30 g 0  . nitroGLYCERIN (NITROSTAT) 0.4 MG SL tablet Place 1 tablet (0.4 mg total) under the tongue every 5 (five) minutes  as needed for chest pain. 90 tablet 3   No current facility-administered medications on file prior to visit.   Allergies  Allergen Reactions  . Morphine And Related Other (See Comments)    G.I. Upset  . Aspirin Other (See Comments)    G.I. Upset   . Demerol [Meperidine]     Gi upset,   . Latex Itching and Rash   Social History   Socioeconomic History  . Marital status: Married    Spouse name: Not on file  . Number of children: 2  . Years of education: Not on file  . Highest education level: Not on file  Occupational History  . Occupation: Heritage manager: [employer]  Tobacco Use  . Smoking status: Former    Packs/day: 0.50    Years: 36.00    Pack years: 18.00    Types: Cigarettes    Quit date: 05/2021    Years since quitting: 0.4  . Smokeless tobacco: Never  Vaping Use  . Vaping Use: Never used  Substance and Sexual Activity  . Alcohol use: No  . Drug use: No  . Sexual activity: Yes  Other Topics Concern  . Not on file  Social History Narrative         She works as a Pharmacist, hospital at [employer].   Does no exercise.   Has smoked about 1/2 ppd since age 79 years old-- now this has been for 30 years.(as of 2015)   Social Determinants of Health   Financial Resource Strain: Not on file  Food Insecurity: Not on file  Transportation Needs: Not on file  Physical Activity: Not on file  Stress: Not on file  Social Connections: Not on file  Intimate Partner Violence: Not on file   Family History  Problem Relation Age of Onset  . Diabetes Father   . Heart attack Father   . Diabetes Brother   . Hypertension Brother   . Diabetes Brother   . Hypertension Brother   . Diabetes Brother   . Diabetes Brother   . Diabetes Brother   . Cancer Mother        unsure primary, deceased at age 64  . Diabetes Other   . Colon cancer Neg Hx       Review of Systems  All other systems reviewed and are negative.      Objective:   Physical Exam Constitutional:      General: She is not in acute distress.    Appearance: Normal appearance. She is not ill-appearing or toxic-appearing.  HENT:     Head: Normocephalic and atraumatic.     Right Ear: Tympanic membrane and ear canal normal.     Left Ear: Tympanic membrane and ear canal normal.     Nose: No congestion or rhinorrhea.     Mouth/Throat:  Mouth: Mucous membranes are moist.     Pharynx: Oropharynx is clear. No oropharyngeal exudate or posterior oropharyngeal erythema.  Eyes:     Conjunctiva/sclera: Conjunctivae normal.     Pupils: Pupils are equal, round, and reactive to light.  Neck:     Vascular: No carotid bruit.  Cardiovascular:     Rate and Rhythm: Normal rate and regular rhythm.     Heart sounds: Normal heart sounds. No murmur heard.   No friction rub. No gallop.  Pulmonary:     Effort: Pulmonary effort is normal. No respiratory distress.     Breath sounds: Normal breath sounds. No stridor. No wheezing, rhonchi or rales.  Abdominal:     General: Bowel sounds are normal. There is no distension.     Palpations: Abdomen is soft.     Tenderness: There is no abdominal tenderness. There is no guarding or rebound.  Genitourinary:    Exam position: Lithotomy position.     Vagina: Normal.     Cervix: Friability present. No cervical motion tenderness.     Uterus: Normal.      Adnexa: Right adnexa normal and left adnexa normal.     Musculoskeletal:     Cervical back: Normal range of motion. No rigidity.  Lymphadenopathy:     Cervical: No cervical adenopathy.  Skin:    Findings: No rash.  Neurological:     General: No focal deficit present.     Mental Status: She is alert and oriented to person, place, and time. Mental status is at baseline.     Cranial Nerves: No cranial nerve deficit.     Motor: No weakness.     Coordination: Coordination normal.     Gait: Gait normal.     Deep Tendon Reflexes: Reflexes normal.  Psychiatric:         Mood and Affect: Mood normal.        Behavior: Behavior normal.        Thought Content: Thought content normal.        Judgment: Judgment normal.          Assessment & Plan:  Coronary artery disease of native artery of native heart with stable angina pectoris ([place]) - Plan: Lipid panel, CBC with Differential/Platelet, COMPLETE METABOLIC PANEL WITH GFR  Smoker  General medical exam - Plan: Lipid panel, CBC with Differential/Platelet, COMPLETE METABOLIC PANEL WITH GFR  Essential hypertension - Plan: Lipid panel, CBC with Differential/Platelet, COMPLETE METABOLIC PANEL WITH GFR  S/P patent foramen ovale closure  Migraine without aura and without status migrainosus, not intractable  History of CVA (cerebrovascular accident) - Plan: Lipid panel, CBC with Differential/Platelet, COMPLETE METABOLIC PANEL WITH GFR  Adenomatous polyp of colon, unspecified part of colon - Plan: Ambulatory referral to Gastroenterology  Cervical cancer screening - Plan: PAP, Thin Prep w/HPV rflx HPV Type 16/18 Pap smear was performed today and sent to pathology and labeled container.  Schedule patient follow-up with her gastroenterologist as it has been 5 years since her last colonoscopy.  Mammogram is up-to-date.  Recommended she avoid statins given her history of coronary artery disease.  If migraines become frequent, could consider Nurtec.  Check CBC CMP and a lipid panel.  Goal LDL cholesterol is less than 70.  Blood pressure today is excellent.  Congratulated the patient smoking cessation.

## 2021-11-12 ENCOUNTER — Encounter: Payer: Self-pay | Admitting: *Deleted

## 2021-11-12 LAB — LIPID PANEL
Cholesterol: 169 mg/dL (ref <200)
HDL: 51 mg/dL (ref >=50)
LDL Cholesterol (Calc): 97 mg/dL (calc)
Non-HDL Cholesterol (Calc): 118 mg/dL (calc) (ref <130)
Total CHOL/HDL Ratio: 3.3 (calc) (ref <5.0)
Triglycerides: 113 mg/dL (ref <150)

## 2021-11-12 LAB — COMPLETE METABOLIC PANEL WITH GFR
AG Ratio: 2.1 (calc) (ref 1.0–2.5)
ALT: 23 U/L (ref 6–29)
AST: 17 U/L (ref 10–35)
Albumin: 4.7 g/dL (ref 3.6–5.1)
Alkaline phosphatase (APISO): 62 U/L (ref 37–153)
BUN: 23 mg/dL (ref 7–25)
CO2: 21 mmol/L (ref 20–32)
Calcium: 10 mg/dL (ref 8.6–10.4)
Chloride: 104 mmol/L (ref 98–110)
Creat: 0.65 mg/dL (ref 0.50–1.03)
Globulin: 2.2 g/dL (calc) (ref 1.9–3.7)
Glucose, Bld: 109 mg/dL — ABNORMAL HIGH (ref 65–99)
Potassium: 4.2 mmol/L (ref 3.5–5.3)
Sodium: 139 mmol/L (ref 135–146)
Total Bilirubin: 0.7 mg/dL (ref 0.2–1.2)
Total Protein: 6.9 g/dL (ref 6.1–8.1)
eGFR: 105 mL/min/{1.73_m2} (ref >=60)

## 2021-11-12 LAB — CBC WITH DIFFERENTIAL/PLATELET
Absolute Monocytes: 518 cells/uL (ref 200–950)
Basophils Absolute: 77 cells/uL (ref 0–200)
Basophils Relative: 0.8 %
Eosinophils Absolute: 240 cells/uL (ref 15–500)
Eosinophils Relative: 2.5 %
HCT: 42.9 % (ref 35.0–45.0)
Hemoglobin: 14.8 g/dL (ref 11.7–15.5)
Lymphs Abs: 3494 cells/uL (ref 850–3900)
MCH: 30.6 pg (ref 27.0–33.0)
MCHC: 34.5 g/dL (ref 32.0–36.0)
MCV: 88.6 fL (ref 80.0–100.0)
MPV: 10.4 fL (ref 7.5–12.5)
Monocytes Relative: 5.4 %
Neutro Abs: 5270 cells/uL (ref 1500–7800)
Neutrophils Relative %: 54.9 %
Platelets: 300 10*3/uL (ref 140–400)
RBC: 4.84 10*6/uL (ref 3.80–5.10)
RDW: 12.3 % (ref 11.0–15.0)
Total Lymphocyte: 36.4 %
WBC: 9.6 10*3/uL (ref 3.8–10.8)

## 2021-11-13 LAB — PAP, TP IMAGING W/ HPV RNA, RFLX HPV TYPE 16,18/45: HPV DNA High Risk: NOT DETECTED

## 2021-11-25 ENCOUNTER — Telehealth: Payer: Self-pay | Admitting: *Deleted

## 2021-11-25 NOTE — Telephone Encounter (Signed)
Referring MD/PCP: Jenna Luo  Procedure: Colonoscopy  Has patient had this procedure before?  Yes, Dr. Gala Romney 10/13/16  If so, when, by whom and where?    Is there a family history of colon cancer?  no  Who?  What age when diagnosed?    Is patient diabetic? If yes, Type 1 or Type 2   no      Does patient have prosthetic heart valve or mechanical valve?  no  Do you have a pacemaker/defibrillator?  no  Has patient ever had endocarditis/atrial fibrillation? no  Does patient use oxygen? no  Has patient had joint replacement within last 12 months?  no  Is patient constipated or do they take laxatives? yes  Does patient have a history of alcohol/drug use?  no  Have you had a stroke/heart attack last 6 mths? no  Do you take medicine for weight loss?  no  For female patients,: have you had a hysterectomy no                      are you post menopausal no                      do you still have your menstrual cycle no  Is patient on blood thinner such as Coumadin, Plavix and/or Aspirin? no  Medications:  Current Outpatient Medications on File Prior to Visit  Medication Sig Dispense Refill   amLODipine (NORVASC) 2.5 MG tablet Take 1 tablet by mouth once daily 90 tablet 1   atorvastatin (LIPITOR) 80 MG tablet GIVE 1 TABLET BY MOUTH ONCE DAILY AT 6PM. 90 tablet 1   metoprolol succinate (TOPROL-XL) 50 MG 24 hr tablet TAKE 1 TABLET BY MOUTH ONCE DAILY WITH MEAL OR IMMEDIATELY FOLLOWING A MEAL 90 tablet 0   nitroGLYCERIN (NITROSTAT) 0.4 MG SL tablet Place 1 tablet (0.4 mg total) under the tongue every 5 (five) minutes as needed for chest pain. 90 tablet 3   omeprazole (PRILOSEC) 20 MG capsule Take 1 capsule by mouth once daily 90 capsule 1   triamcinolone (KENALOG) 0.1 % Apply 1 application topically 2 (two) times daily. 30 g 0   No current facility-administered medications on file prior to visit.     Allergies:  Allergies  Allergen Reactions   Morphine And Related Other (See  Comments)    G.I. Upset   Aspirin Other (See Comments)    G.I. Upset    Demerol [Meperidine]     Gi upset,    Latex Itching and Rash

## 2021-12-19 NOTE — Telephone Encounter (Signed)
LMOVM for pt 

## 2021-12-22 ENCOUNTER — Encounter: Payer: Self-pay | Admitting: *Deleted

## 2022-01-07 ENCOUNTER — Encounter: Payer: Self-pay | Admitting: *Deleted

## 2022-01-07 MED ORDER — NA SULFATE-K SULFATE-MG SULF 17.5-3.13-1.6 GM/177ML PO SOLN: ORAL | 0 refills | Status: DC

## 2022-01-24 ENCOUNTER — Other Ambulatory Visit: Payer: Self-pay | Admitting: Cardiology

## 2022-01-24 DIAGNOSIS — I2510 Atherosclerotic heart disease of native coronary artery without angina pectoris: Secondary | ICD-10-CM

## 2022-01-24 DIAGNOSIS — R0609 Other forms of dyspnea: Secondary | ICD-10-CM

## 2022-01-24 DIAGNOSIS — R9439 Abnormal result of other cardiovascular function study: Secondary | ICD-10-CM

## 2022-01-29 ENCOUNTER — Encounter: Payer: Self-pay | Admitting: *Deleted

## 2022-01-29 ENCOUNTER — Ambulatory Visit: Payer: Self-pay | Admitting: *Deleted

## 2022-01-29 NOTE — Patient Instructions (Signed)
Visit Information  Thank you for taking time to visit with me today. Please don't hesitate to contact me if I can be of assistance to you.   Please call the care guide team at [phone] if you need to cancel or reschedule your appointment.   If you are experiencing a Mental Health or [place] or need someone to talk to, please call the Suicide and Crisis Lifeline: 988 call the [place] National Suicide Prevention Lifeline: (916)401-8018 or TTY: 430-598-8278 TTY 671-546-0986) to talk to a trained counselor call 1-800-273-TALK (toll free, 24 hour hotline) go to [facility] [address] [place] [phone]) call the [place]: [phone]  Patient verbalizes understanding of instructions and care plan provided today and agrees to view in [place]. Active MyChart status and patient understanding of how to access instructions and care plan via MyChart confirmed with patient.     No further follow up required.  Nat Christen, BSW, MSW, LCSW  Licensed Education officer, [facility]  Mailing [place] [address], [place], [place] [address] Physical Address[address] [place], [place] [address] Toll Free Main # [phone] Fax # [phone] Cell # [phone] [email or web address]

## 2022-01-29 NOTE — Patient Outreach (Signed)
  Care Coordination   Initial Visit Note   01/29/2022  Name: Jessica Shaw MRN: [id number] DOB: May 01, 1966  Jessica Shaw is a 56 y.o. year old female who sees Pickard, Cammie Mcgee, MD for primary care. I spoke with Jessica Shaw by phone today.  What matters to the patients health and wellness today?  No Interventions Indicated.   Goals Addressed   None     SDOH assessments and interventions completed:  Yes.    SDOH Interventions Today    Flowsheet Row Most Recent Value  SDOH Interventions   Food Insecurity Interventions Intervention Not Indicated  Financial Strain Interventions Intervention Not Indicated  Housing Interventions Intervention Not Indicated  Physical Activity Interventions Patient Refused  Stress Interventions Intervention Not Indicated  Social Connections Interventions Intervention Not Indicated  Transportation Interventions Intervention Not Indicated        Care Coordination Interventions Activated:  Yes.   Care Coordination Interventions:  Yes, provided.   Follow up plan: No further intervention required.   Encounter Outcome:  Pt. Visit Completed.   Nat Christen, BSW, MSW, LCSW  Licensed Education officer, environmental [facility]  Mailing [place] [address], [place], [place] [address] Physical Address[address] [place], [place] [address] Toll Free Main # [phone] Fax # [phone] Cell # [phone] [email or web address]

## 2022-02-09 ENCOUNTER — Encounter (HOSPITAL_COMMUNITY)
Admission: RE | Disposition: A | Discharge status: Home / Self Care | Payer: Self-pay | Source: Ambulatory Visit | Attending: Internal Medicine

## 2022-02-09 ENCOUNTER — Ambulatory Visit (HOSPITAL_COMMUNITY): Payer: BC Managed Care – PPO | Admitting: Anesthesiology

## 2022-02-09 ENCOUNTER — Encounter (HOSPITAL_COMMUNITY): Payer: Self-pay | Admitting: Internal Medicine

## 2022-02-09 ENCOUNTER — Ambulatory Visit (HOSPITAL_COMMUNITY)
Admission: RE | Admit: 2022-02-09 | Discharge: 2022-02-09 | Disposition: A | Discharge status: Home / Self Care | Payer: BC Managed Care – PPO | Source: Ambulatory Visit | Attending: Internal Medicine | Admitting: Internal Medicine

## 2022-02-09 ENCOUNTER — Other Ambulatory Visit: Payer: Self-pay

## 2022-02-09 DIAGNOSIS — K5730 Diverticulosis of large intestine without perforation or abscess without bleeding: Secondary | ICD-10-CM | POA: Insufficient documentation

## 2022-02-09 DIAGNOSIS — G4733 Obstructive sleep apnea (adult) (pediatric): Secondary | ICD-10-CM | POA: Diagnosis not present

## 2022-02-09 DIAGNOSIS — G4730 Sleep apnea, unspecified: Secondary | ICD-10-CM | POA: Diagnosis not present

## 2022-02-09 DIAGNOSIS — Z1211 Encounter for screening for malignant neoplasm of colon: Secondary | ICD-10-CM | POA: Diagnosis not present

## 2022-02-09 DIAGNOSIS — Z87891 Personal history of nicotine dependence: Secondary | ICD-10-CM | POA: Diagnosis not present

## 2022-02-09 DIAGNOSIS — Z09 Encounter for follow-up examination after completed treatment for conditions other than malignant neoplasm: Secondary | ICD-10-CM | POA: Insufficient documentation

## 2022-02-09 DIAGNOSIS — Z8673 Personal history of transient ischemic attack (TIA), and cerebral infarction without residual deficits: Secondary | ICD-10-CM | POA: Insufficient documentation

## 2022-02-09 DIAGNOSIS — I10 Essential (primary) hypertension: Secondary | ICD-10-CM | POA: Diagnosis not present

## 2022-02-09 DIAGNOSIS — Z86010 Personal history of colonic polyps: Secondary | ICD-10-CM | POA: Insufficient documentation

## 2022-02-09 DIAGNOSIS — K219 Gastro-esophageal reflux disease without esophagitis: Secondary | ICD-10-CM | POA: Diagnosis not present

## 2022-02-09 DIAGNOSIS — I209 Angina pectoris, unspecified: Secondary | ICD-10-CM | POA: Insufficient documentation

## 2022-02-09 HISTORY — PX: COLONOSCOPY WITH PROPOFOL: SHX5780

## 2022-02-09 SURGERY — COLONOSCOPY WITH PROPOFOL
Anesthesia: General

## 2022-02-09 MED ORDER — LACTATED RINGERS IV SOLN: INTRAVENOUS | Status: DC

## 2022-02-09 MED ORDER — LIDOCAINE HCL (CARDIAC) PF 100 MG/5ML IV SOSY
PREFILLED_SYRINGE | INTRAVENOUS | Status: DC | PRN
  Administered 2022-02-09: 60 mg via INTRATRACHEAL

## 2022-02-09 MED ORDER — PHENYLEPHRINE HCL (PRESSORS) 10 MG/ML IV SOLN
INTRAVENOUS | Status: DC | PRN
  Administered 2022-02-09 (×2): 160 ug via INTRAVENOUS

## 2022-02-09 MED ORDER — PROPOFOL 500 MG/50ML IV EMUL
INTRAVENOUS | Status: DC | PRN
  Administered 2022-02-09: 200 ug/kg/min via INTRAVENOUS

## 2022-02-09 MED ORDER — LACTATED RINGERS IV SOLN: INTRAVENOUS | Status: DC | PRN

## 2022-02-09 MED ORDER — PROPOFOL 10 MG/ML IV BOLUS
INTRAVENOUS | Status: DC | PRN
  Administered 2022-02-09: 100 ug via INTRAVENOUS

## 2022-02-09 NOTE — Anesthesia Preprocedure Evaluation (Signed)
Anesthesia Evaluation  Patient identified by MRN, date of birth, ID band Patient awake    Reviewed: Allergy & Precautions, H&P , NPO status , Patient's Chart, lab work & pertinent test results, reviewed documented beta blocker date and time   History of Anesthesia Complications (+) PONV and history of anesthetic complications  Airway Mallampati: II  TM Distance: >3 FB Neck ROM: full    Dental no notable dental hx.    Pulmonary sleep apnea , former smoker,    Pulmonary exam normal breath sounds clear to auscultation       Cardiovascular Exercise Tolerance: Good hypertension, + angina  Rhythm:regular Rate:Normal     Neuro/Psych  Headaches, TIA Neuromuscular disease negative psych ROS   GI/Hepatic Neg liver ROS, GERD  Medicated,  Endo/Other  negative endocrine ROS  Renal/GU negative Renal ROS  negative genitourinary   Musculoskeletal   Abdominal   Peds  Hematology negative hematology ROS (+)   Anesthesia Other Findings   Reproductive/Obstetrics negative OB ROS                             Anesthesia Physical Anesthesia Plan  ASA: 3  Anesthesia Plan: General   Post-op Pain Management:    Induction:   PONV Risk Score and Plan: Propofol infusion  Airway Management Planned:   Additional Equipment:   Intra-op Plan:   Post-operative Plan:   Informed Consent: I have reviewed the patients History and Physical, chart, labs and discussed the procedure including the risks, benefits and alternatives for the proposed anesthesia with the patient or authorized representative who has indicated his/her understanding and acceptance.     Dental Advisory Given  Plan Discussed with: CRNA  Anesthesia Plan Comments:         Anesthesia Quick Evaluation

## 2022-02-09 NOTE — Transfer of Care (Signed)
Immediate Anesthesia Transfer of Care Note  Patient: Jessica Shaw  Procedure(s) Performed: COLONOSCOPY WITH PROPOFOL  Patient Location: Short Stay  Anesthesia Type:General  Level of Consciousness: awake, alert  and oriented  Airway & Oxygen Therapy: Patient Spontanous Breathing  Post-op Assessment: Report given to RN and Post -op Vital signs reviewed and stable  Post vital signs: Reviewed and stable  Last Vitals:  Vitals Value Taken Time  BP 109/56   Temp 36   Pulse 75   Resp 16   SpO2 96     Last Pain:  Vitals:   02/09/22 1050  TempSrc:   PainSc: 0-No pain      Patients Stated Pain Goal: 9 (94/70/76 1518)  Complications: No notable events documented.

## 2022-02-09 NOTE — Discharge Instructions (Signed)
  Colonoscopy Discharge Instructions  Read the instructions outlined below and refer to this sheet in the next few weeks. These discharge instructions provide you with general information on caring for yourself after you leave the hospital. Your doctor may also give you specific instructions. While your treatment has been planned according to the most current medical practices available, unavoidable complications occasionally occur. If you have any problems or questions after discharge, call Dr. Gala Romney at [phone]. ACTIVITY You may resume your regular activity, but move at a slower pace for the next 24 hours.  Take frequent rest periods for the next 24 hours.  Walking will help get rid of the air and reduce the bloated feeling in your belly (abdomen).  No driving for 24 hours (because of the medicine (anesthesia) used during the test).   Do not sign any important legal documents or operate any machinery for 24 hours (because of the anesthesia used during the test).  NUTRITION Drink plenty of fluids.  You may resume your normal diet as instructed by your doctor.  Begin with a light meal and progress to your normal diet. Heavy or fried foods are harder to digest and may make you feel sick to your stomach (nauseated).  Avoid alcoholic beverages for 24 hours or as instructed.  MEDICATIONS You may resume your normal medications unless your doctor tells you otherwise.  WHAT YOU CAN EXPECT TODAY Some feelings of bloating in the abdomen.  Passage of more gas than usual.  Spotting of blood in your stool or on the toilet paper.  IF YOU HAD POLYPS REMOVED DURING THE COLONOSCOPY: No aspirin products for 7 days or as instructed.  No alcohol for 7 days or as instructed.  Eat a soft diet for the next 24 hours.  FINDING OUT THE RESULTS OF YOUR TEST Not all test results are available during your visit. If your test results are not back during the visit, make an appointment with your caregiver to find out the  results. Do not assume everything is normal if you have not heard from your caregiver or the medical facility. It is important for you to follow up on all of your test results.  SEEK IMMEDIATE MEDICAL ATTENTION IF: You have more than a spotting of blood in your stool.  Your belly is swollen (abdominal distention).  You are nauseated or vomiting.  You have a temperature over 101.  You have abdominal pain or discomfort that is severe or gets worse throughout the day.    No polyps found today  It is recommended you return in 5 years for repeat colonoscopy.  At patient request, I called Jessica Shaw at [phone] findings and recommendations

## 2022-02-09 NOTE — Op Note (Signed)
[facility] Patient Name: Jessica Shaw Procedure Date: 02/09/2022 10:37 AM MRN: [id number] Date of Birth: [birth year] Attending MD: Norvel Richards , MD CSN: [id number] Age: 56 Admit Type: Outpatient Procedure:                Colonoscopy Indications:              High risk colon cancer surveillance: Personal                            history of colonic polyps Providers:                Norvel Richards, MD, Lambert Mody,                            Raphael Gibney, Technician Referring MD:              Medicines:                Propofol per Anesthesia Complications:            No immediate complications. Estimated Blood Loss:     Estimated blood loss: none. Procedure:                Pre-Anesthesia Assessment:                           - Prior to the procedure, a History and Physical                            was performed, and patient medications and                            allergies were reviewed. The patient's tolerance of                            previous anesthesia was also reviewed. The risks                            and benefits of the procedure and the sedation                            options and risks were discussed with the patient.                            All questions were answered, and informed consent                            was obtained. Prior Anticoagulants: The patient has                            taken no previous anticoagulant or antiplatelet                            agents. ASA Grade Assessment: II - A patient with  mild systemic disease. After reviewing the risks                            and benefits, the patient was deemed in                            satisfactory condition to undergo the procedure.                           After obtaining informed consent, the colonoscope                            was passed under direct vision. Throughout the                            procedure, the  patient's blood pressure, pulse, and                            oxygen saturations were monitored continuously. The                            [id number]) scope was introduced through the                            anus and advanced to the the cecum, identified by                            appendiceal orifice and ileocecal valve. The                            colonoscopy was performed without difficulty. The                            patient tolerated the procedure well. The quality                            of the bowel preparation was adequate. The                            ileocecal valve, appendiceal orifice, and rectum                            were photographed. The entire colon was well                            visualized. Scope In: 10:51:59 AM Scope Out: 11:03:51 AM Scope Withdrawal Time: 0 hours 6 minutes 41 seconds  Total Procedure Duration: 0 hours 11 minutes 52 seconds  Findings:      The perianal and digital rectal examinations were normal.      The retroflexed view of the distal rectum and anal verge was normal and       showed no anal or rectal abnormalities.      A few medium-mouthed diverticula were found in the sigmoid colon.      The exam  was otherwise without abnormality on direct and retroflexion       views. Impression:               - Diverticulosis in the sigmoid colon.                           - The examination was otherwise normal on direct                            and retroflexion views.                           - No specimens collected. Moderate Sedation:      Moderate (conscious) sedation was personally administered by an       anesthesia professional. The following parameters were monitored: oxygen       saturation, heart rate, blood pressure, respiratory rate, EKG, adequacy       of pulmonary ventilation, and response to care. Recommendation:           - Patient has a contact number available for                            emergencies.  The signs and symptoms of potential                            delayed complications were discussed with the                            patient. Return to normal activities tomorrow.                            Written discharge instructions were provided to the                            patient.                           - Resume previous diet.                           - Continue present medications.                           - Repeat colonoscopy in 5 years for surveillance.                           - Return to GI office (date not yet determined). Procedure Code(s):        --- Professional ---                           [phone], Colonoscopy, flexible; diagnostic, including                            collection of specimen(s) by brushing or washing,  when performed (separate procedure) Diagnosis Code(s):        --- Professional ---                           Z86.010, Personal history of colonic polyps                           K57.30, Diverticulosis of large intestine without                            perforation or abscess without bleeding CPT copyright 2019 American Medical Association. All rights reserved. The codes documented in this report are preliminary and upon coder review may  be revised to meet current compliance requirements. Cristopher Estimable. Oather Muilenburg, MD Norvel Richards, MD 02/09/2022 11:10:19 AM This report has been signed electronically. Number of Addenda: 0

## 2022-02-09 NOTE — H&P (Signed)
$'@LOGO'K$ @   Primary Care Physician:  Jessica Frizzle, MD Primary Gastroenterologist:  Dr. Gala Shaw  Pre-Procedure History & Physical: HPI:  Jessica Shaw is a 56 y.o. female here for surveillance colonoscopy.  History of multiple colonic adenomas previously removed previously-last exam 2019.  Past Medical History:  Diagnosis Date   Complication of anesthesia    Coronary artery disease    PFO closure 2006   Embolism - blood clot    arm   left    GERD (gastroesophageal reflux disease)    HLD (hyperlipidemia)    Migraine    PONV (postoperative nausea and vomiting)    Sleep apnea    mild OSA, no CPAP   Smoker    Spinal cord stimulator status 2006   in lower back, pt patient   Stroke [facility])    TIA, no deficits    Past Surgical History:  Procedure Laterality Date   CARDIAC CATHETERIZATION     CHOLECYSTECTOMY     COLONOSCOPY  08/04/2005   WUJ:WJXBJ external hemorrhoids and anal papilla, otherwise normal   COLONOSCOPY N/A 10/07/2012   Procedure: COLONOSCOPY;  Surgeon: Jessica Dolin, MD;  [facility];  Service: Endoscopy;  Laterality: N/A;  10:30   COLONOSCOPY N/A 10/13/2016   Procedure: COLONOSCOPY;  Surgeon: Jessica Dolin, MD;  [facility];  Service: Endoscopy;  Laterality: N/A;  9:00am   ESOPHAGOGASTRODUODENOSCOPY  04/20/2003   YNW:GNFA changes of reflux esophagitis/Limited gastroesophageal junction, otherwise normal   ESOPHAGOGASTRODUODENOSCOPY N/A 10/07/2012   Procedure: ESOPHAGOGASTRODUODENOSCOPY (EGD);  Surgeon: Jessica Dolin, MD;  [facility];  Service: Endoscopy;  Laterality: N/A;   INTRAVASCULAR PRESSURE WIRE/FFR STUDY N/A 03/14/2019   Procedure: INTRAVASCULAR PRESSURE WIRE/FFR STUDY;  Surgeon: Jessica Mormon, MD;  Location: [place] [facility];  Service: Cardiovascular;  Laterality: N/A;   LEFT HEART CATH AND CORONARY ANGIOGRAPHY N/A 03/14/2019   Procedure: LEFT HEART CATH AND CORONARY ANGIOGRAPHY;  Surgeon: Jessica Mormon,  MD;  Location: [place] [facility];  Service: Cardiovascular;  Laterality: N/A;   PATENT FORAMEN OVALE CLOSURE     POLYPECTOMY  10/13/2016   Procedure: POLYPECTOMY;  Surgeon: Jessica Dolin, MD;  [facility];  Service: Endoscopy;;  colon   SHOULDER ARTHROSCOPY     left   SPINAL CORD STIMULATOR IMPLANT     TUBAL LIGATION     WRIST ARTHROSCOPY WITH DEBRIDEMENT Right 06/23/2018   Procedure: RIGHT ARTHROSCOPY WRIST DEBRIDEMENT [place] TRIQUETRAL TEAR SHRINKAGE;  Surgeon: Daryll Brod, MD;  Location: [place];  Service: Orthopedics;  Laterality: Right;  AXILLARY BLAOCK    Prior to Admission medications   Medication Sig Start Date End Date Taking? Authorizing Provider  amLODipine (NORVASC) 2.5 MG tablet Take 1 tablet by mouth once daily 09/03/21  Yes Pickard, Cammie Mcgee, MD  atorvastatin (LIPITOR) 80 MG tablet GIVE 1 TABLET BY MOUTH ONCE DAILY AT 6PM. 09/03/21  Yes Jessica Frizzle, MD  metoprolol succinate (TOPROL-XL) 50 MG 24 hr tablet TAKE 1 TABLET BY MOUTH WITH MEAL ONCE A DAY OR IMMEDIATELY FOLLOWING A MEAL 01/26/22  Yes Patwardhan, Manish J, MD  Na Sulfate-K Sulfate-Mg Sulf 17.5-3.13-1.6 GM/177ML SOLN As directed 01/07/22  Yes Jessica Collison, Cristopher Estimable, MD  Omega-3 Fatty Acids (FISH OIL) 1000 MG CAPS Take 1,000 mg by mouth daily.   Yes [provider]  omeprazole (PRILOSEC) 20 MG capsule Take 1 capsule by mouth once daily 09/03/21  Yes Pickard, Cammie Mcgee, MD  triamcinolone (KENALOG)  0.1 % Apply 1 application topically 2 (two) times daily. Patient taking differently: Apply 1 application  topically 2 (two) times daily as needed (irritation). 06/17/20  Yes Jessica Frizzle, MD  nitroGLYCERIN (NITROSTAT) 0.4 MG SL tablet Place 0.4 mg under the tongue every 5 (five) minutes as needed for chest pain.    [provider]    Allergies as of 01/07/2022 - Review Complete 11/11/2021  Allergen Reaction Noted   Morphine and related Other (See Comments) 10/16/2010   Aspirin Other  (See Comments) 10/16/2010   Demerol [meperidine]  11/12/2013   Latex Itching and Rash 10/16/2010    Family History  Problem Relation Age of Onset   Diabetes Father    Heart attack Father    Diabetes Brother    Hypertension Brother    Diabetes Brother    Hypertension Brother    Diabetes Brother    Diabetes Brother    Diabetes Brother    Cancer Mother        unsure primary, deceased at age 78   Diabetes Other    Colon cancer Neg Hx     Social History   Socioeconomic History   Marital status: Married    Spouse name: Jessica Shaw   Number of children: 2   Years of education: 12   Highest education level: 12th grade  Occupational History   Occupation: Heritage manager: [employer]  Tobacco Use   Smoking status: Former    Packs/day: 0.50    Years: 36.00    Total pack years: 18.00    Types: Cigarettes    Quit date: 05/2021    Years since quitting: 0.7    Passive exposure: Past   Smokeless tobacco: Never  Vaping Use   Vaping Use: Never used  Substance and Sexual Activity   Alcohol use: No   Drug use: No   Sexual activity: Yes    Partners: Male  Other Topics Concern   Not on file  Social History Narrative         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   Does no exercise.   Has smoked about 1/2 ppd since age 88 years old-- now this has been for 30 years.(as of 2015)   Social Determinants of Health   Financial Resource Strain: Low Risk  (01/29/2022)   Overall Financial Resource Strain (CARDIA)    Difficulty of Paying Living Expenses: Not hard at all  Food Insecurity: No Food Insecurity (01/29/2022)   Hunger Vital Sign    Worried About Running Out of Food in the Last Year: Never true    Ran Out of Food in the Last Year: Never true  Transportation Needs: No Transportation Needs (01/29/2022)   PRAPARE - Hydrologist (Medical): No    Lack of Transportation (Non-Medical): No  Physical  Activity: Inactive (01/29/2022)   Exercise Vital Sign    Days of Exercise per Week: 0 days    Minutes of Exercise per Session: 0 min  Stress: No Stress Concern Present (01/29/2022)   [place]    Feeling of Stress : Only a little  Social Connections: Socially Integrated (01/29/2022)   Social Connection and Isolation Panel [NHANES]    Frequency of Communication with Friends and Family: More than three times a week    Frequency of Social Gatherings with Friends and Family: More than three times a week  Attends Religious Services: More than 4 times per year    Active Member of Clubs or Organizations: Yes    Attends Archivist Meetings: More than 4 times per year    Marital Status: Married  Human resources officer Violence: Not At Risk (01/29/2022)   Humiliation, Afraid, Rape, and Kick questionnaire    Fear of Current or Ex-Partner: No    Emotionally Abused: No    Physically Abused: No    Sexually Abused: No    Review of Systems: See HPI, otherwise negative ROS  Physical Exam: BP 132/83   Pulse 82   Temp 98.3 F (36.8 C) (Oral)   Resp 16   Ht '5\' 2"'$  (1.575 m)   Wt 70.3 kg   SpO2 97%   BMI 28.35 kg/m  Neck:  Supple; no masses or thyromegaly. No significant cervical adenopathy. Lungs:  Clear throughout to auscultation.   No wheezes, crackles, or rhonchi. No acute distress. Heart:  Regular rate and rhythm; no murmurs, clicks, rubs,  or gallops. Abdomen: Non-distended, normal bowel sounds.  Soft and nontender without appreciable mass or hepatosplenomegaly.  Pulses:  Normal pulses noted. Extremities:  Without clubbing or edema.  Impression/Plan: 56 year old lady with a history of multiple colonic adenomas removed over time.  Here for surveillance examination per plan. The risks, benefits, limitations, alternatives and imponderables have been reviewed with the patient. Questions have been answered. All parties are  agreeable.       Notice: This dictation was prepared with Dragon dictation along with smaller phrase technology. Any transcriptional errors that result from this process are unintentional and may not be corrected upon review.

## 2022-02-10 NOTE — Anesthesia Postprocedure Evaluation (Signed)
Anesthesia Post Note  Patient: Jessica Shaw  Procedure(s) Performed: COLONOSCOPY WITH PROPOFOL  Patient location during evaluation: Phase II Anesthesia Type: General Level of consciousness: awake Pain management: pain level controlled Vital Signs Assessment: post-procedure vital signs reviewed and stable Respiratory status: spontaneous breathing and respiratory function stable Cardiovascular status: blood pressure returned to baseline and stable Postop Assessment: no headache and no apparent nausea or vomiting Anesthetic complications: no Comments: Late entry   No notable events documented.   Last Vitals:  Vitals:   02/09/22 1113 02/09/22 1118  BP: 90/63 103/82  Pulse: 72 76  Resp: 18 18  Temp:    SpO2: 99%     Last Pain:  Vitals:   02/09/22 1118  TempSrc:   PainSc: 0-No pain                 Louann Sjogren

## 2022-02-18 ENCOUNTER — Encounter (HOSPITAL_COMMUNITY): Payer: Self-pay | Admitting: Internal Medicine

## 2022-03-09 ENCOUNTER — Other Ambulatory Visit: Payer: Self-pay | Admitting: Family Medicine

## 2022-03-09 DIAGNOSIS — I700 Atherosclerosis of aorta: Secondary | ICD-10-CM

## 2022-03-09 DIAGNOSIS — I25118 Atherosclerotic heart disease of native coronary artery with other forms of angina pectoris: Secondary | ICD-10-CM

## 2022-03-09 DIAGNOSIS — K219 Gastro-esophageal reflux disease without esophagitis: Secondary | ICD-10-CM

## 2022-03-09 DIAGNOSIS — E785 Hyperlipidemia, unspecified: Secondary | ICD-10-CM

## 2022-04-19 ENCOUNTER — Other Ambulatory Visit: Payer: Self-pay | Admitting: Cardiology

## 2022-04-19 DIAGNOSIS — R9439 Abnormal result of other cardiovascular function study: Secondary | ICD-10-CM

## 2022-04-19 DIAGNOSIS — I2510 Atherosclerotic heart disease of native coronary artery without angina pectoris: Secondary | ICD-10-CM

## 2022-04-19 DIAGNOSIS — R0609 Other forms of dyspnea: Secondary | ICD-10-CM

## 2022-05-19 DIAGNOSIS — L820 Inflamed seborrheic keratosis: Secondary | ICD-10-CM | POA: Diagnosis not present

## 2022-05-19 DIAGNOSIS — D485 Neoplasm of uncertain behavior of skin: Secondary | ICD-10-CM | POA: Diagnosis not present

## 2022-05-19 DIAGNOSIS — D225 Melanocytic nevi of trunk: Secondary | ICD-10-CM | POA: Diagnosis not present

## 2022-05-19 DIAGNOSIS — L988 Other specified disorders of the skin and subcutaneous tissue: Secondary | ICD-10-CM | POA: Diagnosis not present

## 2022-05-19 DIAGNOSIS — D2339 Other benign neoplasm of skin of other parts of face: Secondary | ICD-10-CM | POA: Diagnosis not present

## 2022-06-02 ENCOUNTER — Other Ambulatory Visit: Payer: Self-pay | Admitting: Family Medicine

## 2022-06-02 DIAGNOSIS — E785 Hyperlipidemia, unspecified: Secondary | ICD-10-CM

## 2022-06-02 DIAGNOSIS — I700 Atherosclerosis of aorta: Secondary | ICD-10-CM

## 2022-06-02 DIAGNOSIS — I25118 Atherosclerotic heart disease of native coronary artery with other forms of angina pectoris: Secondary | ICD-10-CM

## 2022-06-03 ENCOUNTER — Other Ambulatory Visit: Payer: Self-pay | Admitting: Family Medicine

## 2022-06-03 DIAGNOSIS — K219 Gastro-esophageal reflux disease without esophagitis: Secondary | ICD-10-CM

## 2022-06-03 DIAGNOSIS — I25118 Atherosclerotic heart disease of native coronary artery with other forms of angina pectoris: Secondary | ICD-10-CM

## 2022-06-03 NOTE — Telephone Encounter (Signed)
Requested Prescriptions  Pending Prescriptions Disp Refills   amLODipine (NORVASC) 2.5 MG tablet [Pharmacy Med Name: amLODIPine Besylate 2.5 MG Oral Tablet] 90 tablet 0    Sig: Take 1 tablet by mouth once daily     Cardiovascular: Calcium Channel Blockers 2 Failed - 06/03/2022  6:50 AM      Failed - Valid encounter within last 6 months    Recent Outpatient Visits           6 months ago Coronary artery disease of native artery of native heart with stable angina pectoris ([place])   [place] Pickard, Cammie Mcgee, MD   1 year ago Encounter for smoking cessation counseling   [place] Eulogio Bear, NP   1 year ago Encounter for screening mammogram for malignant neoplasm of breast   [place] Pickard, Cammie Mcgee, MD   3 years ago Palpitations   [place] Delsa Grana, PA-C   3 years ago Acute pain of right shoulder   [place] Delsa Grana, PA-C       Future Appointments             In 3 months Patwardhan, Reynold Bowen, MD [facility]            Passed - Last BP in normal range    BP Readings from Last 1 Encounters:  02/09/22 103/82         Passed - Last Heart Rate in normal range    Pulse Readings from Last 1 Encounters:  02/09/22 76          omeprazole (PRILOSEC) 20 MG capsule [Pharmacy Med Name: Omeprazole 20 MG Oral Capsule Delayed Release] 90 capsule 0    Sig: Take 1 capsule by mouth once daily     Gastroenterology: Proton Pump Inhibitors Passed - 06/03/2022  6:50 AM      Passed - Valid encounter within last 12 months    Recent Outpatient Visits           6 months ago Coronary artery disease of native artery of native heart with stable angina pectoris ([place])   [place] Pickard, Cammie Mcgee, MD   1 year ago Encounter for smoking cessation counseling   [place] Eulogio Bear, NP   1 year ago Encounter for screening  mammogram for malignant neoplasm of breast   [place] Pickard, Cammie Mcgee, MD   3 years ago Palpitations   [place] Delsa Grana, PA-C   3 years ago Acute pain of right shoulder   [place] Delsa Grana, PA-C       Future Appointments             In 3 months Patwardhan, Reynold Bowen, MD [facility]

## 2022-07-14 ENCOUNTER — Other Ambulatory Visit: Payer: Self-pay | Admitting: Cardiology

## 2022-07-14 DIAGNOSIS — I2510 Atherosclerotic heart disease of native coronary artery without angina pectoris: Secondary | ICD-10-CM

## 2022-07-14 DIAGNOSIS — R0609 Other forms of dyspnea: Secondary | ICD-10-CM

## 2022-07-14 DIAGNOSIS — R9439 Abnormal result of other cardiovascular function study: Secondary | ICD-10-CM

## 2022-08-21 ENCOUNTER — Other Ambulatory Visit (HOSPITAL_COMMUNITY): Payer: Self-pay | Admitting: Family Medicine

## 2022-08-21 DIAGNOSIS — Z1231 Encounter for screening mammogram for malignant neoplasm of breast: Secondary | ICD-10-CM

## 2022-09-01 ENCOUNTER — Other Ambulatory Visit: Payer: Self-pay | Admitting: Family Medicine

## 2022-09-01 DIAGNOSIS — I700 Atherosclerosis of aorta: Secondary | ICD-10-CM

## 2022-09-01 DIAGNOSIS — I25118 Atherosclerotic heart disease of native coronary artery with other forms of angina pectoris: Secondary | ICD-10-CM

## 2022-09-01 DIAGNOSIS — E785 Hyperlipidemia, unspecified: Secondary | ICD-10-CM

## 2022-09-02 ENCOUNTER — Ambulatory Visit: Payer: BC Managed Care – PPO | Admitting: Cardiology

## 2022-09-02 ENCOUNTER — Other Ambulatory Visit: Payer: Self-pay | Admitting: Family Medicine

## 2022-09-02 DIAGNOSIS — K219 Gastro-esophageal reflux disease without esophagitis: Secondary | ICD-10-CM

## 2022-09-02 DIAGNOSIS — I25118 Atherosclerotic heart disease of native coronary artery with other forms of angina pectoris: Secondary | ICD-10-CM

## 2022-09-07 ENCOUNTER — Ambulatory Visit (HOSPITAL_COMMUNITY)
Admission: RE | Admit: 2022-09-07 | Discharge: 2022-09-07 | Disposition: A | Discharge status: Home / Self Care | Payer: BC Managed Care – PPO | Source: Ambulatory Visit | Attending: Family Medicine | Admitting: Family Medicine

## 2022-09-07 DIAGNOSIS — Z1231 Encounter for screening mammogram for malignant neoplasm of breast: Secondary | ICD-10-CM | POA: Diagnosis not present

## 2022-09-24 ENCOUNTER — Ambulatory Visit: Payer: BC Managed Care – PPO | Admitting: Cardiology

## 2022-09-24 ENCOUNTER — Encounter: Payer: Self-pay | Admitting: Cardiology

## 2022-09-24 VITALS — BP 110/76 | HR 74 | Ht 62.0 in | Wt 154.0 lb

## 2022-09-24 DIAGNOSIS — E782 Mixed hyperlipidemia: Secondary | ICD-10-CM

## 2022-09-24 DIAGNOSIS — I25118 Atherosclerotic heart disease of native coronary artery with other forms of angina pectoris: Secondary | ICD-10-CM | POA: Diagnosis not present

## 2022-09-24 DIAGNOSIS — I10 Essential (primary) hypertension: Secondary | ICD-10-CM | POA: Diagnosis not present

## 2022-09-24 NOTE — Progress Notes (Addendum)
Follow up visit  Subjective:   Jessica Shaw, female    DOB: 1965-08-12, 57 y.o.   MRN: [id number]   Chief Complaint  Patient presents with   Coronary artery disease of native artery of native heart wi   Follow-up    HPI  57 year old Caucasian female with history of stroke and PFO, s/p PFO (2006), nonobstructive CAD, h/o DVT  Patient is doing well.  She has been physically active, denies chest pain, shortness of breath, palpitations, leg edema, orthopnea, PND, TIA/syncope.  She has quit smoking.  On a separate note, she has noticed hypopigmented patches on her skin.   Current Outpatient Medications:    amLODipine (NORVASC) 2.5 MG tablet, Take 1 tablet by mouth once daily, Disp: 90 tablet, Rfl: 0   atorvastatin (LIPITOR) 80 MG tablet, TAKE 1 TABLET BY MOUTH ONCE DAILY AT  6  P.M., Disp: 90 tablet, Rfl: 0   metoprolol succinate (TOPROL-XL) 50 MG 24 hr tablet, TAKE 1 TABLET BY MOUTH ONCE DAILY WITH  OR  IMMEDIATELY  FOLLOWING  A  MEAL., Disp: 90 tablet, Rfl: 0   nitroGLYCERIN (NITROSTAT) 0.4 MG SL tablet, Place 0.4 mg under the tongue every 5 (five) minutes as needed for chest pain., Disp: , Rfl:    Omega-3 Fatty Acids (FISH OIL) 1000 MG CAPS, Take 1,000 mg by mouth daily., Disp: , Rfl:    omeprazole (PRILOSEC) 20 MG capsule, Take 1 capsule by mouth once daily, Disp: 90 capsule, Rfl: 0    Cardiovascular studies:  EKG 09/24/2022: Sinus rhythm 68 bpm  Low voltage in precordial leads Otherwise normal EKG  Coronary angiography 03/14/2019: LM: Normal LAD: Mid LAD 50% and ostial Diag 40% stenosis at LAD/Diag bifurcation. LAD tapers rapidly in size after the bifurcation. dFR LAD: 0.90 Diag 0.97 LCx: Normal RCA: Distal RCA 20% disease.   LVEDP normal.   Moderate nonobstructive epicardial CAD. Possible microvascular disease, as evidenced by slow TIMI 2 flow in all vessels. Recommend medical management.   Exercise Sestamibi Stress Test 03/06/2019:  Patient exercised on  Bruce protocol. The resting electrocardiogram demonstrated normal sinus rhythm, normal resting conduction. Patient exercised on Bruce protocol for 6:00  minutes and achieved  92 % of Max Predicted HR (Target HR was >85% MPHR) and 7.04 METS. Stress symptoms included fatigue. Normal BP response.  Exercise capacity was normal. Perfusion imaging study demonstrates a medium-sized moderate defect noted from the base towards the apex antero-septal wall.  This defect is not noted on rest images, consistent with ischemia.  Left ventricle systolic function calculated by QGS was moderately depressed at 37%.  There is septal hypokinesis.  High risk study. No previous exam available for comparison.   Echocardiogram 11/04/2017: Left ventricle cavity is normal in size. Normal global wall motion. Normal diastolic filling pattern. Calculated EF 56%. Left atrial cavity is normal in size. There is a ASD closure device without residual leak or thrombus. IVC is dilated with respiratory variation. May suggest elevated central venous pressure.  TEE 2006:  -  The left ventricle was normal.   -  The septal occluder is well visualized. There are 2 small mobile         thrombi noted on tee left atrial side. The left atrial         appendage function was normal (normal emptying velocity).         There was no left atrial appendage thrombus identified.   -  The septal occluder device is located away from the  pulmonary         vein. The pulmonary veins were normal.    Recent labs: 11/11/2021: Glucose 109, BUN/Cr 23/0.65. EGFR 105. Na/K 139/4.2. Rest of the CMP normal H/H 14/42. MCV 88. Platelets 300 Chol 169, TG 113, HDL 51, LDL 97  01/2019: Chol 259, TG 210, HDL 35, LDL 182 HbA1C 6.0% TSH 0.8 normal   Review of Systems  Cardiovascular:  Negative for chest pain, dyspnea on exertion, leg swelling, palpitations and syncope.        Vitals:   09/24/22 1514  BP: 110/76  Pulse: 74  SpO2: 98%     Body mass  index is 28.17 kg/m. Filed Weights   09/24/22 1514  Weight: 154 lb (69.9 kg)     Objective:   Physical Exam Vitals and nursing note reviewed.  Constitutional:      General: She is not in acute distress. Neck:     Vascular: No JVD.  Cardiovascular:     Rate and Rhythm: Normal rate and regular rhythm.     Heart sounds: Normal heart sounds. No murmur heard. Pulmonary:     Effort: Pulmonary effort is normal.     Breath sounds: Normal breath sounds. No wheezing or rales.  Musculoskeletal:     Right lower leg: No edema.     Left lower leg: No edema.       ICD-10-CM   1. Coronary artery disease of native artery of native heart with stable angina pectoris  I25.118 EKG 12-Lead    Lipid panel    2. Mixed hyperlipidemia  E78.2     3. Essential hypertension  I10            Assessment & Recommendations:   57 year old Caucasian female with history of stroke and PFO, s/p closure (2006), nonobstructive CAD, h/o DVT  Hypertension: Controlled  Nonobstructive CAD: Continue statin, amlodipine, metoprolol She is not taking aspirin due to GI intolerance. Check lipid panel  On a separate note, asked her to discuss with PCP regarding hyporpigmented patches on skin.  F/u in 1 year   Elder Negus, MD Pager: [phone] Office: [phone]

## 2022-10-02 DIAGNOSIS — I25118 Atherosclerotic heart disease of native coronary artery with other forms of angina pectoris: Secondary | ICD-10-CM | POA: Diagnosis not present

## 2022-10-03 LAB — LIPID PANEL
Chol/HDL Ratio: 3.1 ratio (ref 0.0–4.4)
Cholesterol, Total: 123 mg/dL (ref 100–199)
HDL: 40 mg/dL (ref >39)
LDL Chol Calc (NIH): 55 mg/dL (ref 0–99)
Triglycerides: 163 mg/dL — ABNORMAL HIGH (ref 0–149)
VLDL Cholesterol Cal: 28 mg/dL (ref 5–40)

## 2022-10-05 ENCOUNTER — Ambulatory Visit: Payer: BC Managed Care – PPO | Admitting: Family Medicine

## 2022-10-05 VITALS — BP 118/72 | HR 72 | Temp 98.1°F | Ht 62.0 in | Wt 154.6 lb

## 2022-10-05 DIAGNOSIS — M2550 Pain in unspecified joint: Secondary | ICD-10-CM

## 2022-10-05 DIAGNOSIS — L80 Vitiligo: Secondary | ICD-10-CM | POA: Diagnosis not present

## 2022-10-05 DIAGNOSIS — N6322 Unspecified lump in the left breast, upper inner quadrant: Secondary | ICD-10-CM | POA: Diagnosis not present

## 2022-10-05 NOTE — Progress Notes (Signed)
Subjective:    Patient ID: Jessica Shaw, female    DOB: [birth year], 57 y.o.   MRN: [id number]  HPI  Patient believes she feels a lump in her left breast.  The area is located at 11:00 above the left nipple.  I have a difficult time discerning any discrete mass.  Firm area but this appears to be the body of the rib.  However the patient states that she has never palpated this before and that it feels asymmetric compared to the other side.  There are no sharp borders.  It definitely feels like it is in the chest wall.  It is nontender.  It is nonmobile.  She also reports diffuse arthralgias.  She complains of pain in both knees, both ankles, both hips.  She also has vitiligo in both axilla as well as on her upper medial left thigh.  She has patches of sharp well-demarcated hypopigmentation in these areas.  She states that this just started Past Medical History:  Diagnosis Date   Complication of anesthesia    Coronary artery disease    PFO closure 2006   Embolism - blood clot    arm   left    GERD (gastroesophageal reflux disease)    HLD (hyperlipidemia)    Migraine    PONV (postoperative nausea and vomiting)    Sleep apnea    mild OSA, no CPAP   Smoker    Spinal cord stimulator status 2006   in lower back, pt patient   Stroke    TIA, no deficits   Past Surgical History:  Procedure Laterality Date   CARDIAC CATHETERIZATION     CHOLECYSTECTOMY     COLONOSCOPY  08/04/2005   WUJ:[id number] external hemorrhoids and anal papilla, otherwise normal   COLONOSCOPY N/A 10/07/2012   Procedure: COLONOSCOPY;  Surgeon: Corbin Ade, MD;  [facility];  Service: Endoscopy;  Laterality: N/A;  10:30   COLONOSCOPY N/A 10/13/2016   Procedure: COLONOSCOPY;  Surgeon: Corbin Ade, MD;  [facility];  Service: Endoscopy;  Laterality: N/A;  9:00am   COLONOSCOPY WITH PROPOFOL N/A 02/09/2022   Procedure: COLONOSCOPY WITH PROPOFOL;  Surgeon: Corbin Ade, MD;  [facility];  Service: Endoscopy;  Laterality: N/A;  11:15am   CORONARY PRESSURE/FFR STUDY N/A 03/14/2019   Procedure: INTRAVASCULAR PRESSURE WIRE/FFR STUDY;  Surgeon: Elder Negus, MD;  [facility];  Service: Cardiovascular;  Laterality: N/A;   ESOPHAGOGASTRODUODENOSCOPY  04/20/2003   YNW:GNFA changes of reflux esophagitis/Limited gastroesophageal junction, otherwise normal   ESOPHAGOGASTRODUODENOSCOPY N/A 10/07/2012   Procedure: ESOPHAGOGASTRODUODENOSCOPY (EGD);  Surgeon: Corbin Ade, MD;  [facility];  Service: Endoscopy;  Laterality: N/A;   LEFT HEART CATH AND CORONARY ANGIOGRAPHY N/A 03/14/2019   Procedure: LEFT HEART CATH AND CORONARY ANGIOGRAPHY;  Surgeon: Elder Negus, MD;  [facility];  Service: Cardiovascular;  Laterality: N/A;   PATENT FORAMEN OVALE CLOSURE     POLYPECTOMY  10/13/2016   Procedure: POLYPECTOMY;  Surgeon: Corbin Ade, MD;  [facility];  Service: Endoscopy;;  colon   SHOULDER ARTHROSCOPY     left   SPINAL CORD STIMULATOR IMPLANT     TUBAL LIGATION     WRIST ARTHROSCOPY WITH DEBRIDEMENT Right 06/23/2018   Procedure: RIGHT ARTHROSCOPY WRIST DEBRIDEMENT LUNO TRIQUETRAL TEAR SHRINKAGE;  Surgeon: Cindee Salt, MD;  Location: [facility];  Service: Orthopedics;  Laterality: Right;  AXILLARY BLAOCK  Current Outpatient Medications on File Prior to Visit  Medication Sig Dispense Refill   amLODipine (NORVASC) 2.5 MG tablet Take 1 tablet by mouth once daily 90 tablet 0   atorvastatin (LIPITOR) 80 MG tablet TAKE 1 TABLET BY MOUTH ONCE DAILY AT  6  P.M. 90 tablet 0   metoprolol succinate (TOPROL-XL) 50 MG 24 hr tablet TAKE 1 TABLET BY MOUTH ONCE DAILY WITH  OR  IMMEDIATELY  FOLLOWING  A  MEAL. 90 tablet 0   nitroGLYCERIN (NITROSTAT) 0.4 MG SL tablet Place 0.4 mg under the tongue every 5 (five) minutes as needed for chest pain.     Omega-3 Fatty Acids (FISH OIL) 1000 MG CAPS Take 1,000 mg by mouth  daily.     omeprazole (PRILOSEC) 20 MG capsule Take 1 capsule by mouth once daily 90 capsule 0   No current facility-administered medications on file prior to visit.   Allergies  Allergen Reactions   Meperidine Hcl     Other Reaction(s): abdominal pain   Morphine     Other Reaction(s): abdominal pain   Morphine And Related Other (See Comments)    G.I. Upset   Aspirin Other (See Comments)    G.I. Upset  Other Reaction(s): GI Intolerance   Demerol [Meperidine]     Gi upset   Latex Itching and Rash   Social History   Socioeconomic History   Marital status: Married    Spouse name: Chiyeko Ferre   Number of children: 2   Years of education: 12   Highest education level: Some college, no degree  Occupational History   Occupation: Dentist: [employer]  Tobacco Use   Smoking status: Former    Packs/day: 0.50    Years: 36.00    Additional pack years: 0.00    Total pack years: 18.00    Types: Cigarettes    Quit date: 05/2021    Years since quitting: 1.3    Passive exposure: Past   Smokeless tobacco: Never  Vaping Use   Vaping Use: Never used  Substance and Sexual Activity   Alcohol use: No   Drug use: No   Sexual activity: Yes    Partners: Male  Other Topics Concern   Not on file  Social History Narrative         She works as a Production designer, 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   Does no exercise.   Has smoked about 1/2 ppd since age 57 years old-- now this has been for 30 years.(as of 2015)   Social Determinants of Health   Financial Resource Strain: Low Risk  (10/05/2022)   Overall Financial Resource Strain (CARDIA)    Difficulty of Paying Living Expenses: Not hard at all  Food Insecurity: No Food Insecurity (10/05/2022)   Hunger Vital Sign    Worried About Running Out of Food in the Last Year: Never true    Ran Out of Food in the Last Year: Never true  Transportation Needs: No Transportation Needs (10/05/2022)   PRAPARE -  Administrator, Civil Service (Medical): No    Lack of Transportation (Non-Medical): No  Physical Activity: Sufficiently Active (10/05/2022)   Exercise Vital Sign    Days of Exercise per Week: 5 days    Minutes of Exercise per Session: 90 min  Stress: Stress Concern Present (10/05/2022)   [employer] of Occupational Health - Occupational Stress Questionnaire    Feeling of Stress : To some extent  Social Connections: Unknown (10/05/2022)   Social Connection and Isolation Panel [NHANES]    Frequency of Communication with Friends and Family: More than three times a week    Frequency of Social Gatherings with Friends and Family: Twice a week    Attends Religious Services: Patient declined    Database administrator or Organizations: No    Attends Engineer, structural: More than 4 times per year    Marital Status: Married  Catering manager Violence: Not At Risk (01/29/2022)   Humiliation, Afraid, Rape, and Kick questionnaire    Fear of Current or Ex-Partner: No    Emotionally Abused: No    Physically Abused: No    Sexually Abused: No   Family History  Problem Relation Age of Onset   Diabetes Father    Heart attack Father    Diabetes Brother    Hypertension Brother    Diabetes Brother    Hypertension Brother    Diabetes Brother    Diabetes Brother    Diabetes Brother    Cancer Mother        unsure primary, deceased at age 24   Diabetes Other    Colon cancer Neg Hx       Review of Systems  All other systems reviewed and are negative.      Objective:   Physical Exam Constitutional:      General: She is not in acute distress.    Appearance: Normal appearance. She is not ill-appearing or toxic-appearing.  HENT:     Head: Normocephalic and atraumatic.     Nose: No congestion or rhinorrhea.     Mouth/Throat:     Mouth: Mucous membranes are moist.     Pharynx: Oropharynx is clear. No oropharyngeal exudate or posterior oropharyngeal erythema.  Eyes:      Conjunctiva/sclera: Conjunctivae normal.     Pupils: Pupils are equal, round, and reactive to light.  Neck:     Vascular: No carotid bruit.  Cardiovascular:     Rate and Rhythm: Normal rate and regular rhythm.     Heart sounds: Normal heart sounds. No murmur heard.    No friction rub. No gallop.  Pulmonary:     Effort: Pulmonary effort is normal. No respiratory distress.     Breath sounds: Normal breath sounds. No stridor. No wheezing, rhonchi or rales.  Chest:    Abdominal:     General: Bowel sounds are normal. There is no distension.     Palpations: Abdomen is soft.     Tenderness: There is no abdominal tenderness. There is no guarding or rebound.  Musculoskeletal:     Cervical back: Normal range of motion. No rigidity.  Lymphadenopathy:     Cervical: No cervical adenopathy.  Skin:    Coloration: Skin is pale.     Findings: Rash present. Rash is macular.  Neurological:     General: No focal deficit present.     Mental Status: She is alert and oriented to person, place, and time. Mental status is at baseline.     Cranial Nerves: No cranial nerve deficit.     Motor: No weakness.     Coordination: Coordination normal.     Gait: Gait normal.     Deep Tendon Reflexes: Reflexes normal.  Psychiatric:        Mood and Affect: Mood normal.        Behavior: Behavior normal.        Thought Content: Thought content normal.  Judgment: Judgment normal.           Assessment & Plan:  Mass of upper inner quadrant of left breast - Plan: US BREAST COMPLETE UNI LEFT INC AXILLA  Polyarthralgia - Plan: CBC with Differential/Platelet, COMPLETE METABOLIC PANEL WITH GFR, Sedimentation rate, ANA, Rheumatoid factor, CK  Vitiligo I believe the patient is palpating her rib.  I do not believe that this is a breast lesion however I will schedule the patient for an ultrasound of left breast to evaluate for thoroughness sake.  Given her polyarthralgias I will workup for autoimmune  diseases by checking a CBC a CMP and sed rate CK rheumatoid factor and an ANA.  If the lab work is unremarkable I will consult dermatology for vitiligo if labs suggest an underlying autoimmune disease, I will consult rheumatology

## 2022-10-06 ENCOUNTER — Other Ambulatory Visit: Payer: Self-pay | Admitting: Family Medicine

## 2022-10-06 DIAGNOSIS — N6322 Unspecified lump in the left breast, upper inner quadrant: Secondary | ICD-10-CM

## 2022-10-06 LAB — CK: Total CK: 89 U/L (ref 29–143)

## 2022-10-07 LAB — CBC WITH DIFFERENTIAL/PLATELET
Absolute Monocytes: 507 cells/uL (ref 200–950)
Basophils Absolute: 78 cells/uL (ref 0–200)
Basophils Relative: 1 %
Eosinophils Absolute: 211 cells/uL (ref 15–500)
Eosinophils Relative: 2.7 %
HCT: 40.9 % (ref 35.0–45.0)
Hemoglobin: 13.6 g/dL (ref 11.7–15.5)
Lymphs Abs: 2558 cells/uL (ref 850–3900)
MCH: 29.5 pg (ref 27.0–33.0)
MCHC: 33.3 g/dL (ref 32.0–36.0)
MCV: 88.7 fL (ref 80.0–100.0)
MPV: 10.8 fL (ref 7.5–12.5)
Monocytes Relative: 6.5 %
Neutro Abs: 4446 cells/uL (ref 1500–7800)
Neutrophils Relative %: 57 %
Platelets: 283 10*3/uL (ref 140–400)
RBC: 4.61 10*6/uL (ref 3.80–5.10)
RDW: 12.5 % (ref 11.0–15.0)
Total Lymphocyte: 32.8 %
WBC: 7.8 10*3/uL (ref 3.8–10.8)

## 2022-10-07 LAB — COMPLETE METABOLIC PANEL WITH GFR
AG Ratio: 2 (calc) (ref 1.0–2.5)
ALT: 29 U/L (ref 6–29)
AST: 21 U/L (ref 10–35)
Albumin: 4.6 g/dL (ref 3.6–5.1)
Alkaline phosphatase (APISO): 49 U/L (ref 37–153)
BUN: 19 mg/dL (ref 7–25)
CO2: 26 mmol/L (ref 20–32)
Calcium: 9.6 mg/dL (ref 8.6–10.4)
Chloride: 108 mmol/L (ref 98–110)
Creat: 0.69 mg/dL (ref 0.50–1.03)
Globulin: 2.3 g/dL (calc) (ref 1.9–3.7)
Glucose, Bld: 92 mg/dL (ref 65–99)
Potassium: 4.6 mmol/L (ref 3.5–5.3)
Sodium: 141 mmol/L (ref 135–146)
Total Bilirubin: 0.6 mg/dL (ref 0.2–1.2)
Total Protein: 6.9 g/dL (ref 6.1–8.1)
eGFR: 102 mL/min/{1.73_m2} (ref >=60)

## 2022-10-07 LAB — RHEUMATOID FACTOR: Rheumatoid fact SerPl-aCnc: <10 IU/mL (ref <14)

## 2022-10-07 LAB — SEDIMENTATION RATE: Sed Rate: 2 mm/h (ref 0–30)

## 2022-10-07 LAB — ANA: Anti Nuclear Antibody (ANA): NEGATIVE

## 2022-10-08 ENCOUNTER — Other Ambulatory Visit: Payer: Self-pay

## 2022-10-08 DIAGNOSIS — L80 Vitiligo: Secondary | ICD-10-CM

## 2022-10-10 ENCOUNTER — Other Ambulatory Visit: Payer: Self-pay | Admitting: Cardiology

## 2022-10-10 DIAGNOSIS — R9439 Abnormal result of other cardiovascular function study: Secondary | ICD-10-CM

## 2022-10-10 DIAGNOSIS — R0609 Other forms of dyspnea: Secondary | ICD-10-CM

## 2022-10-10 DIAGNOSIS — I2510 Atherosclerotic heart disease of native coronary artery without angina pectoris: Secondary | ICD-10-CM

## 2022-10-16 ENCOUNTER — Encounter: Payer: Self-pay | Admitting: Cardiology

## 2022-10-21 ENCOUNTER — Ambulatory Visit (HOSPITAL_COMMUNITY)
Admission: RE | Admit: 2022-10-21 | Discharge: 2022-10-21 | Disposition: A | Discharge status: Home / Self Care | Payer: BC Managed Care – PPO | Source: Ambulatory Visit | Attending: Family Medicine | Admitting: Family Medicine

## 2022-10-21 DIAGNOSIS — N6322 Unspecified lump in the left breast, upper inner quadrant: Secondary | ICD-10-CM

## 2022-10-21 DIAGNOSIS — N6489 Other specified disorders of breast: Secondary | ICD-10-CM | POA: Diagnosis not present

## 2022-10-21 DIAGNOSIS — R92322 Mammographic fibroglandular density, left breast: Secondary | ICD-10-CM | POA: Diagnosis not present

## 2022-11-29 ENCOUNTER — Other Ambulatory Visit: Payer: Self-pay | Admitting: Family Medicine

## 2022-11-29 DIAGNOSIS — E785 Hyperlipidemia, unspecified: Secondary | ICD-10-CM

## 2022-11-29 DIAGNOSIS — I25118 Atherosclerotic heart disease of native coronary artery with other forms of angina pectoris: Secondary | ICD-10-CM

## 2022-11-29 DIAGNOSIS — I700 Atherosclerosis of aorta: Secondary | ICD-10-CM

## 2022-11-30 ENCOUNTER — Other Ambulatory Visit: Payer: Self-pay | Admitting: Family Medicine

## 2022-11-30 DIAGNOSIS — K219 Gastro-esophageal reflux disease without esophagitis: Secondary | ICD-10-CM

## 2022-11-30 DIAGNOSIS — I25118 Atherosclerotic heart disease of native coronary artery with other forms of angina pectoris: Secondary | ICD-10-CM

## 2022-11-30 NOTE — Telephone Encounter (Signed)
Requested Prescriptions  Pending Prescriptions Disp Refills   atorvastatin (LIPITOR) 80 MG tablet [Pharmacy Med Name: Atorvastatin Calcium 80 MG Oral Tablet] 90 tablet 0    Sig: TAKE 1 TABLET BY MOUTH ONCE DAILY AT  6  PM     Cardiovascular:  Antilipid - Statins Failed - 11/29/2022 11:29 AM      Failed - Valid encounter within last 12 months    Recent Outpatient Visits           1 year ago Coronary artery disease of native artery of native heart with stable angina pectoris (HCC)   [facility] Pickard, Priscille Heidelberg, MD   1 year ago Encounter for smoking cessation counseling   [facility] Valentino Nose, NP   2 years ago Encounter for screening mammogram for malignant neoplasm of breast   [facility] Pickard, Priscille Heidelberg, MD   3 years ago Palpitations   [facility] Danelle Berry, PA-C   4 years ago Acute pain of right shoulder   [facility] Danelle Berry, PA-C       Future Appointments             In 9 months Patwardhan, Anabel Bene, MD [facility]            Failed - Lipid Panel in normal range within the last 12 months    Cholesterol, Total  Date Value Ref Range Status  10/02/2022 123 100 - 199 mg/dL Final   LDL Cholesterol (Calc)  Date Value Ref Range Status  11/11/2021 97 mg/dL (calc) Final    Comment:    Reference range: <100 . Desirable range <100 mg/dL for primary prevention;   <70 mg/dL for patients with CHD or diabetic patients  with > or = 2 CHD risk factors. Marland Kitchen LDL-C is now calculated using the Martin-Hopkins  calculation, which is a validated novel method providing  better accuracy than the Friedewald equation in the  estimation of LDL-C.  Horald Pollen et al. Lenox Ahr. 1610;960(45): 2061-2068  (http://education.[facility].com/faq/FAQ164)    LDL Chol Calc (NIH)  Date Value Ref Range Status  10/02/2022 55 0 - 99 mg/dL Final   HDL  Date Value Ref Range  Status  10/02/2022 40 >39 mg/dL Final   Triglycerides  Date Value Ref Range Status  10/02/2022 163 (H) 0 - 149 mg/dL Final         Passed - Patient is not pregnant

## 2023-01-06 ENCOUNTER — Other Ambulatory Visit: Payer: Self-pay | Admitting: Cardiology

## 2023-01-06 DIAGNOSIS — R9439 Abnormal result of other cardiovascular function study: Secondary | ICD-10-CM

## 2023-01-06 DIAGNOSIS — R0609 Other forms of dyspnea: Secondary | ICD-10-CM

## 2023-01-06 DIAGNOSIS — I2510 Atherosclerotic heart disease of native coronary artery without angina pectoris: Secondary | ICD-10-CM

## 2023-01-07 ENCOUNTER — Ambulatory Visit: Payer: BC Managed Care – PPO | Admitting: Family Medicine

## 2023-01-07 ENCOUNTER — Encounter: Payer: Self-pay | Admitting: Family Medicine

## 2023-01-07 ENCOUNTER — Other Ambulatory Visit: Payer: Self-pay | Admitting: Family Medicine

## 2023-01-07 VITALS — BP 122/70 | HR 76 | Temp 98.6°F | Ht 62.0 in | Wt 144.0 lb

## 2023-01-07 DIAGNOSIS — R195 Other fecal abnormalities: Secondary | ICD-10-CM | POA: Diagnosis not present

## 2023-01-07 DIAGNOSIS — K529 Noninfective gastroenteritis and colitis, unspecified: Secondary | ICD-10-CM | POA: Diagnosis not present

## 2023-01-07 LAB — CBC WITH DIFFERENTIAL/PLATELET
Absolute Monocytes: 581 cells/uL (ref 200–950)
Basophils Absolute: 33 cells/uL (ref 0–200)
Basophils Relative: 0.5 %
Eosinophils Absolute: 112 cells/uL (ref 15–500)
Eosinophils Relative: 1.7 %
Hemoglobin: 15 g/dL (ref 11.7–15.5)
Lymphs Abs: 2033 cells/uL (ref 850–3900)
MCH: 29.1 pg (ref 27.0–33.0)
MCHC: 32.9 g/dL (ref 32.0–36.0)
MCV: 88.4 fL (ref 80.0–100.0)
MPV: 10.7 fL (ref 7.5–12.5)
Monocytes Relative: 8.8 %
Neutro Abs: 3841 cells/uL (ref 1500–7800)
Neutrophils Relative %: 58.2 %
Platelets: 262 10*3/uL (ref 140–400)
RBC: 5.16 10*6/uL — ABNORMAL HIGH (ref 3.80–5.10)
RDW: 12.6 % (ref 11.0–15.0)
Total Lymphocyte: 30.8 %
WBC: 6.6 10*3/uL (ref 3.8–10.8)

## 2023-01-07 MED ORDER — ONDANSETRON HCL 4 MG PO TABS: 4.0000 mg | ORAL_TABLET | Freq: Three times a day (TID) | ORAL | 0 refills | Status: DC | PRN

## 2023-01-07 NOTE — Progress Notes (Signed)
Subjective:  HPI: Jessica Shaw is a 57 y.o. female presenting on 01/07/2023 for Acute Visit (bad stomach pain, vomiting, upset stomach, appearance of coffee grounds in stool; weak upon standing, pounding heasdache, lethargic and very weak - JBG\\\)   HPI Patient is in today for 1.5 days of abdominal pain, vomiting, diarrhea, malaise, and lethargy. Has been eating crackers and drinking water, she cannot tolerate more. She also reports coffee gound stools since last night.  She does have a peptic ulcer and avoids triggering foods and medications.  Denies fever, chills.  Review of Systems  All other systems reviewed and are negative.   Relevant past medical history reviewed and updated as indicated.   Past Medical History:  Diagnosis Date   Complication of anesthesia    Coronary artery disease    PFO closure 2006   Embolism - blood clot    arm   left    GERD (gastroesophageal reflux disease)    HLD (hyperlipidemia)    Migraine    PONV (postoperative nausea and vomiting)    Sleep apnea    mild OSA, no CPAP   Smoker    Spinal cord stimulator status 2006   in lower back, pt patient   Stroke [facility])    TIA, no deficits     Past Surgical History:  Procedure Laterality Date   CARDIAC CATHETERIZATION     CHOLECYSTECTOMY     COLONOSCOPY  08/04/2005   VWU:[id number] external hemorrhoids and anal papilla, otherwise normal   COLONOSCOPY N/A 10/07/2012   Procedure: COLONOSCOPY;  Surgeon: Corbin Ade, MD;  [facility];  Service: Endoscopy;  Laterality: N/A;  10:30   COLONOSCOPY N/A 10/13/2016   Procedure: COLONOSCOPY;  Surgeon: Corbin Ade, MD;  [facility];  Service: Endoscopy;  Laterality: N/A;  9:00am   COLONOSCOPY WITH PROPOFOL N/A 02/09/2022   Procedure: COLONOSCOPY WITH PROPOFOL;  Surgeon: Corbin Ade, MD;  [facility];  Service: Endoscopy;  Laterality: N/A;  11:15am   CORONARY PRESSURE/FFR STUDY N/A 03/14/2019   Procedure:  INTRAVASCULAR PRESSURE WIRE/FFR STUDY;  Surgeon: Elder Negus, MD;  [facility];  Service: Cardiovascular;  Laterality: N/A;   ESOPHAGOGASTRODUODENOSCOPY  04/20/2003   JYN:WGNF changes of reflux esophagitis/Limited gastroesophageal junction, otherwise normal   ESOPHAGOGASTRODUODENOSCOPY N/A 10/07/2012   Procedure: ESOPHAGOGASTRODUODENOSCOPY (EGD);  Surgeon: Corbin Ade, MD;  [facility];  Service: Endoscopy;  Laterality: N/A;   LEFT HEART CATH AND CORONARY ANGIOGRAPHY N/A 03/14/2019   Procedure: LEFT HEART CATH AND CORONARY ANGIOGRAPHY;  Surgeon: Elder Negus, MD;  [facility];  Service: Cardiovascular;  Laterality: N/A;   PATENT FORAMEN OVALE CLOSURE     POLYPECTOMY  10/13/2016   Procedure: POLYPECTOMY;  Surgeon: Corbin Ade, MD;  [facility];  Service: Endoscopy;;  colon   SHOULDER ARTHROSCOPY     left   SPINAL CORD STIMULATOR IMPLANT     TUBAL LIGATION     WRIST ARTHROSCOPY WITH DEBRIDEMENT Right 06/23/2018   Procedure: RIGHT ARTHROSCOPY WRIST DEBRIDEMENT LUNO TRIQUETRAL TEAR SHRINKAGE;  Surgeon: Cindee Salt, MD;  Location: [facility];  Service: Orthopedics;  Laterality: Right;  AXILLARY BLAOCK    Allergies and medications reviewed and updated.   Current Outpatient Medications:    amLODipine (NORVASC) 2.5 MG tablet, Take 1 tablet by mouth once daily, Disp: 90 tablet, Rfl: 1   aspirin EC 81 MG tablet, Take 81 mg by mouth  daily. Swallow whole., Disp: , Rfl:    atorvastatin (LIPITOR) 80 MG tablet, TAKE 1 TABLET BY MOUTH ONCE DAILY AT  6  PM, Disp: 90 tablet, Rfl: 0   nitroGLYCERIN (NITROSTAT) 0.4 MG SL tablet, Place 0.4 mg under the tongue every 5 (five) minutes as needed for chest pain., Disp: , Rfl:    Omega-3 Fatty Acids (FISH OIL) 1000 MG CAPS, Take 1,000 mg by mouth daily., Disp: , Rfl:    omeprazole (PRILOSEC) 20 MG capsule, Take 1 capsule by mouth once daily, Disp: 90 capsule, Rfl: 1    ondansetron (ZOFRAN) 4 MG tablet, Take 1 tablet (4 mg total) by mouth every 8 (eight) hours as needed for nausea or vomiting., Disp: 20 tablet, Rfl: 0   metoprolol succinate (TOPROL-XL) 50 MG 24 hr tablet, TAKE 1 TABLET BY MOUTH ONCE DAILY WITH  OR  IMMEDIATELY  FOLLOWING  A  MEAL, Disp: 90 tablet, Rfl: 0  Allergies  Allergen Reactions   Meperidine Hcl     Other Reaction(s): abdominal pain   Morphine     Other Reaction(s): abdominal pain   Morphine And Codeine Other (See Comments)    G.I. Upset   Aspirin Other (See Comments)    G.I. Upset  Other Reaction(s): GI Intolerance   Demerol [Meperidine]     Gi upset   Latex Itching and Rash    Objective:   BP 122/70   Pulse 76   Temp 98.6 F (37 C) (Oral)   Ht 5\' 2"  (1.575 m)   Wt 144 lb (65.3 kg)   SpO2 96%   BMI 26.34 kg/m      01/07/2023    9:20 AM 10/05/2022    3:54 PM 09/24/2022    3:14 PM  Vitals with BMI  Height 5\' 2"  5\' 2"  5\' 2"   Weight 144 lbs 154 lbs 10 oz 154 lbs  BMI 26.33 28.27 28.16  Systolic 122 118 086  Diastolic 70 72 76  Pulse 76 72 74     Physical Exam Vitals and nursing note reviewed.  Constitutional:      Appearance: Normal appearance. She is normal weight.  HENT:     Head: Normocephalic and atraumatic.  Cardiovascular:     Rate and Rhythm: Normal rate and regular rhythm.     Pulses: Normal pulses.     Heart sounds: Normal heart sounds.  Pulmonary:     Effort: Pulmonary effort is normal.     Breath sounds: Normal breath sounds.  Abdominal:     General: Bowel sounds are increased. There is no distension.     Palpations: Abdomen is soft.     Tenderness: There is abdominal tenderness in the epigastric area.  Skin:    General: Skin is warm and dry.  Neurological:     General: No focal deficit present.     Mental Status: She is alert and oriented to person, place, and time. Mental status is at baseline.  Psychiatric:        Mood and Affect: Mood normal.        Behavior: Behavior normal.         Thought Content: Thought content normal.        Judgment: Judgment normal.     Assessment & Plan:  Gastroenteritis Assessment & Plan: Patient presents today with symptoms of acute onset gastroenteritis with possible GI bleeding. Will start Zofran. Encouraged to push electrolyte fluids and introduce a bland diet as tolerated. I have ordered a fecal blood and  placed urgent referral to GI for colonoscopy/endoscopy. Instructed to seek medical care if symptoms become worse to include acutely worsening abdominal pain, vomiting, or frank blood in stool.  Orders: -     CBC with Differential/Platelet -     COMPLETE METABOLIC PANEL WITH GFR -     Fecal Globin By Immunochemistry -     Ambulatory referral to Gastroenterology  Other orders -     Ondansetron HCl; Take 1 tablet (4 mg total) by mouth every 8 (eight) hours as needed for nausea or vomiting.  Dispense: 20 tablet; Refill: 0     Follow up plan: Return if symptoms worsen or fail to improve.  Park Meo, FNP

## 2023-01-07 NOTE — Assessment & Plan Note (Signed)
Patient presents today with symptoms of acute onset gastroenteritis with possible GI bleeding. Will start Zofran. Encouraged to push electrolyte fluids and introduce a bland diet as tolerated. I have ordered a fecal blood and placed urgent referral to GI for colonoscopy/endoscopy. Instructed to seek medical care if symptoms become worse to include acutely worsening abdominal pain, vomiting, or frank blood in stool.

## 2023-01-08 LAB — CBC WITH DIFFERENTIAL/PLATELET: HCT: 45.6 % — ABNORMAL HIGH (ref 35.0–45.0)

## 2023-01-08 LAB — COMPLETE METABOLIC PANEL WITH GFR
ALT: 31 U/L — ABNORMAL HIGH (ref 6–29)
Creat: 0.66 mg/dL (ref 0.50–1.03)

## 2023-01-19 NOTE — Progress Notes (Unsigned)
Referring Provider: Park Meo, FNP Primary Care Physician:  Donita Brooks, MD Primary GI Physician: Dr. Jena Gauss  No chief complaint on file.   HPI:   Jessica Shaw is a 57 y.o. female presenting today with a history of TIA, HTN, HLD, CAD, GERD, colonic tubular adenomas due for surveillance colonoscopy in 2028, presenting today at the request of Park Meo, FNP  for gastroenteritis.   Reviewed OV with Park Meo, FNP 01/07/23. Patient reported 1.5 days of abdominal pain, vomiting, diarrhea, malaise, and lethargy. Also reported coffee ground stools. Patient was given Zofran, encouraged to stay hydrated with electrolyte fluids, bland diet, complete labs/fecal blood test, and urgent referral to GI.  Hgb was 15.0. Minimal elevation of ALT at 31, otherwise, no significant abnormalities on CMP, and fecal occult blood negative.   Today:       Last colonoscopy 02/09/22: Diverticulosis in the sigmoid colon, otherwise normal exam.  Recommended surveillance in 5 years.  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 Pathology with reactive gastropathy.   Past Medical History:  Diagnosis Date   Complication of anesthesia    Coronary artery disease    PFO closure 2006   Embolism - blood clot    arm   left    GERD (gastroesophageal reflux disease)    HLD (hyperlipidemia)    Migraine    PONV (postoperative nausea and vomiting)    Sleep apnea    mild OSA, no CPAP   Smoker    Spinal cord stimulator status 2006   in lower back, pt patient   Stroke [facility])    TIA, no deficits    Past Surgical History:  Procedure Laterality Date   CARDIAC CATHETERIZATION     CHOLECYSTECTOMY     COLONOSCOPY  08/04/2005   [id number] external hemorrhoids and anal papilla, otherwise normal   COLONOSCOPY N/A 10/07/2012   Procedure: COLONOSCOPY;  Surgeon: Corbin Ade, MD;  [facility];  Service: Endoscopy;   Laterality: N/A;  10:30   COLONOSCOPY N/A 10/13/2016   Procedure: COLONOSCOPY;  Surgeon: Corbin Ade, MD;  [facility];  Service: Endoscopy;  Laterality: N/A;  9:00am   COLONOSCOPY WITH PROPOFOL N/A 02/09/2022   Procedure: COLONOSCOPY WITH PROPOFOL;  Surgeon: Corbin Ade, MD;  [facility];  Service: Endoscopy;  Laterality: N/A;  11:15am   CORONARY PRESSURE/FFR STUDY N/A 03/14/2019   Procedure: INTRAVASCULAR PRESSURE WIRE/FFR STUDY;  Surgeon: Elder Negus, MD;  [facility];  Service: Cardiovascular;  Laterality: N/A;   ESOPHAGOGASTRODUODENOSCOPY  04/20/2003   [id number] changes of reflux esophagitis/Limited gastroesophageal junction, otherwise normal   ESOPHAGOGASTRODUODENOSCOPY N/A 10/07/2012   Procedure: ESOPHAGOGASTRODUODENOSCOPY (EGD);  Surgeon: Corbin Ade, MD;  [facility];  Service: Endoscopy;  Laterality: N/A;   LEFT HEART CATH AND CORONARY ANGIOGRAPHY N/A 03/14/2019   Procedure: LEFT HEART CATH AND CORONARY ANGIOGRAPHY;  Surgeon: Elder Negus, MD;  [facility];  Service: Cardiovascular;  Laterality: N/A;   PATENT FORAMEN OVALE CLOSURE     POLYPECTOMY  10/13/2016   Procedure: POLYPECTOMY;  Surgeon: Corbin Ade, MD;  [facility];  Service: Endoscopy;;  colon   SHOULDER ARTHROSCOPY     left   SPINAL CORD STIMULATOR IMPLANT     TUBAL LIGATION     WRIST ARTHROSCOPY WITH DEBRIDEMENT Right 06/23/2018   Procedure: RIGHT ARTHROSCOPY WRIST DEBRIDEMENT  LUNO TRIQUETRAL TEAR SHRINKAGE;  Surgeon: Cindee Salt, MD;  Location: [facility];  Service: Orthopedics;  Laterality: Right;  AXILLARY BLAOCK    Current Outpatient Medications  Medication Sig Dispense Refill   amLODipine (NORVASC) 2.5 MG tablet Take 1 tablet by mouth once daily 90 tablet 1   aspirin EC 81 MG tablet Take 81 mg by mouth daily. Swallow whole.     atorvastatin (LIPITOR) 80 MG tablet TAKE 1 TABLET BY MOUTH ONCE DAILY AT   6  PM 90 tablet 0   metoprolol succinate (TOPROL-XL) 50 MG 24 hr tablet TAKE 1 TABLET BY MOUTH ONCE DAILY WITH  OR  IMMEDIATELY  FOLLOWING  A  MEAL 90 tablet 0   nitroGLYCERIN (NITROSTAT) 0.4 MG SL tablet Place 0.4 mg under the tongue every 5 (five) minutes as needed for chest pain.     Omega-3 Fatty Acids (FISH OIL) 1000 MG CAPS Take 1,000 mg by mouth daily.     omeprazole (PRILOSEC) 20 MG capsule Take 1 capsule by mouth once daily 90 capsule 1   ondansetron (ZOFRAN) 4 MG tablet Take 1 tablet (4 mg total) by mouth every 8 (eight) hours as needed for nausea or vomiting. 20 tablet 0   No current facility-administered medications for this visit.    Allergies as of 01/21/2023 - Review Complete 01/07/2023  Allergen Reaction Noted   Meperidine hcl  09/24/2022   Morphine  09/24/2022   Morphine and codeine Other (See Comments) 10/16/2010   Aspirin Other (See Comments) 10/16/2010   Demerol [meperidine]  11/12/2013   Latex Itching and Rash 10/16/2010    Family History  Problem Relation Age of Onset   Diabetes Father    Heart attack Father    Diabetes Brother    Hypertension Brother    Diabetes Brother    Hypertension Brother    Diabetes Brother    Diabetes Brother    Diabetes Brother    Cancer Mother        unsure primary, deceased at age 51   Diabetes Other    Colon cancer Neg Hx     Social History   Socioeconomic History   Marital status: Married    Spouse name: Yzabel Majure   Number of children: 2   Years of education: 12   Highest education level: Some college, no degree  Occupational History   Occupation: Dentist: [employer]  Tobacco Use   Smoking status: Former    Current packs/day: 0.00    Average packs/day: 0.5 packs/day for 36.0 years (18.0 ttl pk-yrs)    Types: Cigarettes    Start date: 05/1985    Quit date: 05/2021    Years since quitting: 1.6    Passive exposure: Past   Smokeless tobacco: Never  Vaping Use   Vaping status: Never  Used  Substance and Sexual Activity   Alcohol use: No   Drug use: No   Sexual activity: Yes    Partners: Male  Other Topics Concern   Not on file  Social History Narrative         She works as a Production designer, 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   Does no exercise.   Has smoked about 1/2 ppd since age 28 years old-- now this has been for 30 years.(as of 2015)   Social Determinants of Health   Financial Resource Strain: Low Risk  (10/05/2022)   Overall Financial Resource Strain (CARDIA)    Difficulty of  Paying Living Expenses: Not hard at all  Food Insecurity: No Food Insecurity (10/05/2022)   Hunger Vital Sign    Worried About Running Out of Food in the Last Year: Never true    Ran Out of Food in the Last Year: Never true  Transportation Needs: No Transportation Needs (10/05/2022)   PRAPARE - Administrator, Civil Service (Medical): No    Lack of Transportation (Non-Medical): No  Physical Activity: Sufficiently Active (10/05/2022)   Exercise Vital Sign    Days of Exercise per Week: 5 days    Minutes of Exercise per Session: 90 min  Stress: Stress Concern Present (10/05/2022)   [employer] of Occupational Health - Occupational Stress Questionnaire    Feeling of Stress : To some extent  Social Connections: Unknown (10/05/2022)   Social Connection and Isolation Panel [NHANES]    Frequency of Communication with Friends and Family: More than three times a week    Frequency of Social Gatherings with Friends and Family: Twice a week    Attends Religious Services: Patient declined    Database administrator or Organizations: No    Attends Engineer, structural: More than 4 times per year    Marital Status: Married    Review of Systems: Gen: Denies fever, chills, anorexia. Denies fatigue, weakness, weight loss.  CV: Denies chest pain, palpitations, syncope, peripheral edema, and claudication. Resp: Denies dyspnea at rest, cough, wheezing, coughing up  blood, and pleurisy. GI: Denies vomiting blood, jaundice, and fecal incontinence.   Denies dysphagia or odynophagia. Derm: Denies rash, itching, dry skin Psych: Denies depression, anxiety, memory loss, confusion. No homicidal or suicidal ideation.  Heme: Denies bruising, bleeding, and enlarged lymph nodes.  Physical Exam: There were no vitals taken for this visit. General:   Alert and oriented. No distress noted. Pleasant and cooperative.  Head:  Normocephalic and atraumatic. Eyes:  Conjuctiva clear without scleral icterus. Heart:  S1, S2 present without murmurs appreciated. Lungs:  Clear to auscultation bilaterally. No wheezes, rales, or rhonchi. No distress.  Abdomen:  +BS, soft, non-tender and non-distended. No rebound or guarding. No HSM or masses noted. Msk:  Symmetrical without gross deformities. Normal posture. Extremities:  Without edema. Neurologic:  Alert and  oriented x4 Psych:  Normal mood and affect.    Assessment:     Plan:  ***   Ermalinda Memos, PA-C [facility] 01/21/2023

## 2023-01-21 ENCOUNTER — Encounter: Payer: Self-pay | Admitting: Gastroenterology

## 2023-01-21 ENCOUNTER — Ambulatory Visit: Payer: BC Managed Care – PPO | Admitting: Gastroenterology

## 2023-01-21 VITALS — BP 119/76 | HR 86 | Temp 98.2°F | Ht 62.0 in | Wt 146.8 lb

## 2023-01-21 DIAGNOSIS — K920 Hematemesis: Secondary | ICD-10-CM | POA: Diagnosis not present

## 2023-01-21 DIAGNOSIS — Z8711 Personal history of peptic ulcer disease: Secondary | ICD-10-CM

## 2023-01-21 DIAGNOSIS — R1013 Epigastric pain: Secondary | ICD-10-CM

## 2023-01-21 DIAGNOSIS — K5900 Constipation, unspecified: Secondary | ICD-10-CM | POA: Diagnosis not present

## 2023-01-21 MED ORDER — OMEPRAZOLE 40 MG PO CPDR
40.0000 mg | DELAYED_RELEASE_CAPSULE | Freq: Two times a day (BID) | ORAL | 3 refills | Status: DC
Start: 2023-01-21 — End: 2023-05-17

## 2023-01-21 NOTE — Patient Instructions (Signed)
We will arrange for you to have an upper endoscopy with Dr. Jena Gauss at [facility].  Increase omeprazole to 40 mg twice daily 30 minutes before breakfast and dinner.  Avoid all NSAID products including ibuprofen, Aleve, Advil, BC powders, Goody powders, and anything that says "NSAID" on the package.  Start Linzess 290 mcg daily.  We are providing you with samples today.  Please call next week with a progress report.  As we discussed, this medication can cause diarrhea at first, but it should taper off in a couple of weeks.  I will plan to see back in the office in about 3 months or sooner if needed.  It was great to meet you today!  Ermalinda Memos, PA-C [facility]

## 2023-01-26 ENCOUNTER — Encounter: Payer: Self-pay | Admitting: *Deleted

## 2023-02-16 ENCOUNTER — Encounter: Payer: Self-pay | Admitting: *Deleted

## 2023-02-16 ENCOUNTER — Other Ambulatory Visit: Payer: Self-pay | Admitting: Gastroenterology

## 2023-02-16 ENCOUNTER — Telehealth: Payer: Self-pay | Admitting: Gastroenterology

## 2023-02-16 DIAGNOSIS — K5900 Constipation, unspecified: Secondary | ICD-10-CM

## 2023-02-16 MED ORDER — LINACLOTIDE 290 MCG PO CAPS
290.0000 ug | ORAL_CAPSULE | Freq: Every day | ORAL | 3 refills | Status: DC
Start: 2023-02-16 — End: 2024-03-28

## 2023-02-16 NOTE — Telephone Encounter (Signed)
Patient left a message that she was given samples of Linzess and it is working for her and she would like a prescription for it.

## 2023-02-16 NOTE — Telephone Encounter (Signed)
Rx sent 

## 2023-02-18 ENCOUNTER — Encounter (HOSPITAL_COMMUNITY): Payer: Self-pay

## 2023-02-18 ENCOUNTER — Encounter (HOSPITAL_COMMUNITY)
Admission: RE | Admit: 2023-02-18 | Discharge: 2023-02-18 | Disposition: A | Discharge status: Home / Self Care | Payer: BC Managed Care – PPO | Source: Ambulatory Visit | Attending: Internal Medicine | Admitting: Internal Medicine

## 2023-02-18 ENCOUNTER — Other Ambulatory Visit: Payer: Self-pay

## 2023-02-22 ENCOUNTER — Ambulatory Visit (HOSPITAL_COMMUNITY): Payer: BC Managed Care – PPO | Admitting: Certified Registered"

## 2023-02-22 ENCOUNTER — Encounter (HOSPITAL_COMMUNITY): Payer: Self-pay | Admitting: Internal Medicine

## 2023-02-22 ENCOUNTER — Encounter (HOSPITAL_COMMUNITY)
Admission: RE | Disposition: A | Discharge status: Home / Self Care | Payer: Self-pay | Source: Home / Self Care | Attending: Internal Medicine

## 2023-02-22 ENCOUNTER — Ambulatory Visit (HOSPITAL_COMMUNITY)
Admission: RE | Admit: 2023-02-22 | Discharge: 2023-02-22 | Disposition: A | Discharge status: Home / Self Care | Payer: BC Managed Care – PPO | Attending: Internal Medicine | Admitting: Internal Medicine

## 2023-02-22 DIAGNOSIS — G709 Myoneural disorder, unspecified: Secondary | ICD-10-CM | POA: Diagnosis not present

## 2023-02-22 DIAGNOSIS — Z8249 Family history of ischemic heart disease and other diseases of the circulatory system: Secondary | ICD-10-CM | POA: Insufficient documentation

## 2023-02-22 DIAGNOSIS — K317 Polyp of stomach and duodenum: Secondary | ICD-10-CM | POA: Insufficient documentation

## 2023-02-22 DIAGNOSIS — Z8673 Personal history of transient ischemic attack (TIA), and cerebral infarction without residual deficits: Secondary | ICD-10-CM | POA: Diagnosis not present

## 2023-02-22 DIAGNOSIS — K219 Gastro-esophageal reflux disease without esophagitis: Secondary | ICD-10-CM | POA: Insufficient documentation

## 2023-02-22 DIAGNOSIS — K920 Hematemesis: Secondary | ICD-10-CM | POA: Diagnosis not present

## 2023-02-22 DIAGNOSIS — Z7982 Long term (current) use of aspirin: Secondary | ICD-10-CM | POA: Insufficient documentation

## 2023-02-22 DIAGNOSIS — Z87891 Personal history of nicotine dependence: Secondary | ICD-10-CM | POA: Insufficient documentation

## 2023-02-22 DIAGNOSIS — I10 Essential (primary) hypertension: Secondary | ICD-10-CM | POA: Diagnosis not present

## 2023-02-22 DIAGNOSIS — I25119 Atherosclerotic heart disease of native coronary artery with unspecified angina pectoris: Secondary | ICD-10-CM | POA: Diagnosis not present

## 2023-02-22 HISTORY — PX: ESOPHAGOGASTRODUODENOSCOPY (EGD) WITH PROPOFOL: SHX5813

## 2023-02-22 HISTORY — PX: POLYPECTOMY: SHX5525

## 2023-02-22 SURGERY — ESOPHAGOGASTRODUODENOSCOPY (EGD) WITH PROPOFOL
Anesthesia: General

## 2023-02-22 MED ORDER — LIDOCAINE HCL (CARDIAC) PF 100 MG/5ML IV SOSY
PREFILLED_SYRINGE | INTRAVENOUS | Status: DC | PRN
  Administered 2023-02-22: 50 mg via INTRAVENOUS

## 2023-02-22 MED ORDER — PROPOFOL 10 MG/ML IV BOLUS
INTRAVENOUS | Status: DC | PRN
  Administered 2023-02-22: 100 mg via INTRAVENOUS

## 2023-02-22 MED ORDER — LACTATED RINGERS IV SOLN: INTRAVENOUS | Status: DC

## 2023-02-22 MED ORDER — PROPOFOL 500 MG/50ML IV EMUL
INTRAVENOUS | Status: DC | PRN
  Administered 2023-02-22: 150 ug/kg/min via INTRAVENOUS

## 2023-02-22 MED ORDER — PROPOFOL 1000 MG/100ML IV EMUL
INTRAVENOUS | Status: AC
  Filled 2023-02-22: qty 100

## 2023-02-22 MED ORDER — PHENYLEPHRINE 80 MCG/ML (10ML) SYRINGE FOR IV PUSH (FOR BLOOD PRESSURE SUPPORT)
PREFILLED_SYRINGE | INTRAVENOUS | Status: DC | PRN
Start: 2023-02-22 — End: 2023-02-22
  Administered 2023-02-22 (×2): 160 ug via INTRAVENOUS
  Administered 2023-02-22 (×2): 80 ug via INTRAVENOUS

## 2023-02-22 MED ORDER — PHENYLEPHRINE 80 MCG/ML (10ML) SYRINGE FOR IV PUSH (FOR BLOOD PRESSURE SUPPORT)
PREFILLED_SYRINGE | INTRAVENOUS | Status: AC
  Filled 2023-02-22: qty 10

## 2023-02-22 NOTE — Transfer of Care (Signed)
Immediate Anesthesia Transfer of Care Note  Patient: Jessica Shaw  Procedure(s) Performed: ESOPHAGOGASTRODUODENOSCOPY (EGD) WITH PROPOFOL POLYPECTOMY  Patient Location: Short Stay  Anesthesia Type:General  Level of Consciousness: awake  Airway & Oxygen Therapy: Patient Spontanous Breathing  Post-op Assessment: Report given to RN and Post -op Vital signs reviewed and stable  Post vital signs: Reviewed and stable  Last Vitals:  Vitals Value Taken Time  BP    Temp    Pulse    Resp    SpO2      Last Pain:  Vitals:   02/22/23 0914  TempSrc:   PainSc: 0-No pain      Patients Stated Pain Goal: 7 (02/22/23 0914)  Complications: No notable events documented.

## 2023-02-22 NOTE — Anesthesia Postprocedure Evaluation (Signed)
Anesthesia Post Note  Patient: Jessica Shaw  Procedure(s) Performed: ESOPHAGOGASTRODUODENOSCOPY (EGD) WITH PROPOFOL POLYPECTOMY  Patient location during evaluation: Phase II Anesthesia Type: General Level of consciousness: awake Pain management: pain level controlled Vital Signs Assessment: post-procedure vital signs reviewed and stable Respiratory status: spontaneous breathing and respiratory function stable Cardiovascular status: blood pressure returned to baseline and stable Postop Assessment: no headache and no apparent nausea or vomiting Anesthetic complications: no Comments: Late entry   No notable events documented.   Last Vitals:  Vitals:   02/22/23 0848 02/22/23 0933  BP: 116/76 126/83  Pulse: 67 80  Resp: 11 17  Temp: 36.9 C (!) 36.4 C  SpO2: 100% 99%    Last Pain:  Vitals:   02/22/23 0933  TempSrc: Oral  PainSc: 0-No pain                 Windell Norfolk

## 2023-02-22 NOTE — H&P (Signed)
@LOGO @   Primary Care Physician:  Donita Brooks, MD Primary Gastroenterologist:  Dr. Jena Gauss  Pre-Procedure History & Physical: HPI:  Jessica Shaw is a 57 y.o. female here for further evaluation of self-limiting coffee-ground emesis aspirin powder use.  Past Medical History:  Diagnosis Date   Complication of anesthesia    Coronary artery disease    PFO closure 2006   Embolism - blood clot    arm   left    GERD (gastroesophageal reflux disease)    HLD (hyperlipidemia)    Migraine    PONV (postoperative nausea and vomiting)    Smoker    Spinal cord stimulator status 2006   in lower back, pt patient   Stroke [facility])    TIA, no deficits    Past Surgical History:  Procedure Laterality Date   CARDIAC CATHETERIZATION     CHOLECYSTECTOMY     COLONOSCOPY  08/04/2005   [id number] external hemorrhoids and anal papilla, otherwise normal   COLONOSCOPY N/A 10/07/2012   Procedure: COLONOSCOPY;  Surgeon: Corbin Ade, MD;  [facility];  Service: Endoscopy;  Laterality: N/A;  10:30   COLONOSCOPY N/A 10/13/2016   Procedure: COLONOSCOPY;  Surgeon: Corbin Ade, MD;  [facility];  Service: Endoscopy;  Laterality: N/A;  9:00am   COLONOSCOPY WITH PROPOFOL N/A 02/09/2022   Procedure: COLONOSCOPY WITH PROPOFOL;  Surgeon: Corbin Ade, MD;  [facility];  Service: Endoscopy;  Laterality: N/A;  11:15am   CORONARY PRESSURE/FFR STUDY N/A 03/14/2019   Procedure: INTRAVASCULAR PRESSURE WIRE/FFR STUDY;  Surgeon: Elder Negus, MD;  [facility];  Service: Cardiovascular;  Laterality: N/A;   ESOPHAGOGASTRODUODENOSCOPY  04/20/2003   MVH:QION changes of reflux esophagitis/Limited gastroesophageal junction, otherwise normal   ESOPHAGOGASTRODUODENOSCOPY N/A 10/07/2012   Procedure: ESOPHAGOGASTRODUODENOSCOPY (EGD);  Surgeon: Corbin Ade, MD;  [facility];  Service: Endoscopy;  Laterality: N/A;   LEFT HEART CATH AND CORONARY  ANGIOGRAPHY N/A 03/14/2019   Procedure: LEFT HEART CATH AND CORONARY ANGIOGRAPHY;  Surgeon: Elder Negus, MD;  [facility];  Service: Cardiovascular;  Laterality: N/A;   PATENT FORAMEN OVALE CLOSURE     POLYPECTOMY  10/13/2016   Procedure: POLYPECTOMY;  Surgeon: Corbin Ade, MD;  [facility];  Service: Endoscopy;;  colon   SHOULDER ARTHROSCOPY     left   SPINAL CORD STIMULATOR IMPLANT     TUBAL LIGATION     WRIST ARTHROSCOPY WITH DEBRIDEMENT Right 06/23/2018   Procedure: RIGHT ARTHROSCOPY WRIST DEBRIDEMENT LUNO TRIQUETRAL TEAR SHRINKAGE;  Surgeon: Cindee Salt, MD;  Location: [facility];  Service: Orthopedics;  Laterality: Right;  AXILLARY BLAOCK    Prior to Admission medications   Medication Sig Start Date End Date Taking? Authorizing Provider  amLODipine (NORVASC) 2.5 MG tablet Take 1 tablet by mouth once daily 11/30/22  Yes Pickard, Priscille Heidelberg, MD  aspirin EC 81 MG tablet Take 81 mg by mouth daily. Swallow whole.   Yes [provider]  atorvastatin (LIPITOR) 80 MG tablet TAKE 1 TABLET BY MOUTH ONCE DAILY AT  6  PM 11/30/22  Yes Donita Brooks, MD  linaclotide [facility]) 290 MCG CAPS capsule Take 1 capsule (290 mcg total) by mouth daily before breakfast. 02/16/23  Yes Letta Median, PA-C  metoprolol succinate (TOPROL-XL) 50 MG 24 hr tablet TAKE 1 TABLET BY MOUTH ONCE DAILY WITH  OR  IMMEDIATELY  FOLLOWING  A  MEAL 01/07/23  Yes Patwardhan, Anabel Bene, MD  omeprazole (PRILOSEC) 40 MG capsule Take 1 capsule (40 mg total) by mouth 2 (two) times daily before a meal. 01/21/23  Yes Clearance Coots, Kristen S, PA-C  nitroGLYCERIN (NITROSTAT) 0.4 MG SL tablet Place 0.4 mg under the tongue every 5 (five) minutes as needed for chest pain.    [provider]  Omega-3 Fatty Acids (FISH OIL) 1000 MG CAPS Take 1,000 mg by mouth daily.    [provider]    Allergies as of 01/26/2023 - Review Complete 01/07/2023  Allergen Reaction Noted    Meperidine hcl  09/24/2022   Morphine  09/24/2022   Morphine and codeine Other (See Comments) 10/16/2010   Aspirin Other (See Comments) 10/16/2010   Demerol [meperidine]  11/12/2013   Latex Itching and Rash 10/16/2010    Family History  Problem Relation Age of Onset   Diabetes Father    Heart attack Father    Diabetes Brother    Hypertension Brother    Diabetes Brother    Hypertension Brother    Diabetes Brother    Diabetes Brother    Diabetes Brother    Cancer Mother        unsure primary, deceased at age 60   Diabetes Other    Colon cancer Neg Hx     Social History   Socioeconomic History   Marital status: Married    Spouse name: Zubaida Degrandis   Number of children: 2   Years of education: 12   Highest education level: Some college, no degree  Occupational History   Occupation: Dentist: [employer]  Tobacco Use   Smoking status: Former    Current packs/day: 0.00    Average packs/day: 0.5 packs/day for 36.0 years (18.0 ttl pk-yrs)    Types: Cigarettes    Start date: 05/1985    Quit date: 05/2021    Years since quitting: 1.7    Passive exposure: Past   Smokeless tobacco: Never  Vaping Use   Vaping status: Every Day   Substances: Nicotine, Flavoring  Substance and Sexual Activity   Alcohol use: No   Drug use: No   Sexual activity: Yes    Partners: Male  Other Topics Concern   Not on file  Social History Narrative         She works as a Production designer, 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   Does no exercise.   Has smoked about 1/2 ppd since age 61 years old-- now this has been for 30 years.(as of 2015)   Social Determinants of Health   Financial Resource Strain: Low Risk  (10/05/2022)   Overall Financial Resource Strain (CARDIA)    Difficulty of Paying Living Expenses: Not hard at all  Food Insecurity: No Food Insecurity (10/05/2022)   Hunger Vital Sign    Worried About Running Out of Food in the Last Year: Never true     Ran Out of Food in the Last Year: Never true  Transportation Needs: No Transportation Needs (10/05/2022)   PRAPARE - Administrator, Civil Service (Medical): No    Lack of Transportation (Non-Medical): No  Physical Activity: Sufficiently Active (10/05/2022)   Exercise Vital Sign    Days of Exercise per Week: 5 days    Minutes of Exercise per Session: 90 min  Stress: Stress Concern Present (10/05/2022)   [employer] of Occupational Health - Occupational Stress Questionnaire    Feeling of Stress : To some  extent  Social Connections: Unknown (10/05/2022)   Social Connection and Isolation Panel [NHANES]    Frequency of Communication with Friends and Family: More than three times a week    Frequency of Social Gatherings with Friends and Family: Twice a week    Attends Religious Services: Patient declined    Database administrator or Organizations: No    Attends Engineer, structural: Not on file    Marital Status: Married  Catering manager Violence: Not At Risk (01/29/2022)   Humiliation, Afraid, Rape, and Kick questionnaire    Fear of Current or Ex-Partner: No    Emotionally Abused: No    Physically Abused: No    Sexually Abused: No    Review of Systems: See HPI, otherwise negative ROS  Physical Exam: BP 116/76   Pulse 67   Temp 98.4 F (36.9 C) (Oral)   Resp 11   Ht 5\' 2"  (1.575 m)   Wt 66.6 kg   SpO2 100%   BMI 26.85 kg/m  General:   Alert,  Well-developed, well-nourished, pleasant and cooperative in NAD Skin:  Intact without significant lesions or rashes. Eyes:  Sclera clear, no icterus.   Conjunctiva pink. Ears:  Normal auditory acuity. Nose:  No deformity, discharge,  or lesions. Mouth:  No deformity or lesions. Neck:  Supple; no masses or thyromegaly. No significant cervical adenopathy. Lungs:  Clear throughout to auscultation.   No wheezes, crackles, or rhonchi. No acute distress. Heart:  Regular rate and rhythm; no murmurs, clicks, rubs,  or  gallops. Abdomen: Non-distended, normal bowel sounds.  Soft and nontender without appreciable mass or hepatosplenomegaly.  Pulses:  Normal pulses noted. Extremities:  Without clubbing or edema.  Impression/Plan: 57 year old lady here for further evaluation of epigastric pain coffee-ground emesis.  Resolved on omeprazole 40 mg twice daily.  Denies dysphagia.  No longer taking aspirin/NSAIDs.  I have offered the patient a diagnostic EGD today.   The risks, benefits, limitations, alternatives and imponderables have been reviewed with the patient. Potential for esophageal dilation, biopsy, etc. have also been reviewed.  Questions have been answered. All parties agreeable.       Notice: This dictation was prepared with Dragon dictation along with smaller phrase technology. Any transcriptional errors that result from this process are unintentional and may not be corrected upon review.

## 2023-02-22 NOTE — Discharge Instructions (Signed)
EGD Discharge instructions Please read the instructions outlined below and refer to this sheet in the next few weeks. These discharge instructions provide you with general information on caring for yourself after you leave the hospital. Your doctor may also give you specific instructions. While your treatment has been planned according to the most current medical practices available, unavoidable complications occasionally occur. If you have any problems or questions after discharge, please call your doctor. ACTIVITY You may resume your regular activity but move at a slower pace for the next 24 hours.  Take frequent rest periods for the next 24 hours.  Walking will help expel (get rid of) the air and reduce the bloated feeling in your abdomen.  No driving for 24 hours (because of the anesthesia (medicine) used during the test).  You may shower.  Do not sign any important legal documents or operate any machinery for 24 hours (because of the anesthesia used during the test).  NUTRITION Drink plenty of fluids.  You may resume your normal diet.  Begin with a light meal and progress to your normal diet.  Avoid alcoholic beverages for 24 hours or as instructed by your caregiver.  MEDICATIONS You may resume your normal medications unless your caregiver tells you otherwise.  WHAT YOU CAN EXPECT TODAY You may experience abdominal discomfort such as a feeling of fullness or "gas" pains.  FOLLOW-UP Your doctor will discuss the results of your test with you.  SEEK IMMEDIATE MEDICAL ATTENTION IF ANY OF THE FOLLOWING OCCUR: Excessive nausea (feeling sick to your stomach) and/or vomiting.  Severe abdominal pain and distention (swelling).  Trouble swallowing.  Temperature over 101 F (37.8 C).  Rectal bleeding or vomiting of blood.     Your EGD today was normal except for a small gastric polyp which was removed.  Further recommendations to follow pending review of pathology report  Office visit with  Ermalinda Memos in 3 months  At patient request, I called Karleen Dolphin at [phone] -reviewed findings and recommendations

## 2023-02-22 NOTE — Op Note (Signed)
[facility] Patient Name: Jessica Shaw Procedure Date: 02/22/2023 8:57 AM MRN: [id number] Date of Birth: August 27, 1965 Attending MD: Gennette Pac , MD, [id number] CSN: [id number] Age: 57 Admit Type: Outpatient Procedure:                Upper GI endoscopy Indications:              Coffee-ground emesis Providers:                Gennette Pac, MD, Nena Polio, RN, Pandora Leiter, Technician, Elinor Parkinson Referring MD:              Medicines:                Propofol per Anesthesia Complications:            No immediate complications. Estimated Blood Loss:     Estimated blood loss was minimal. Procedure:                Pre-Anesthesia Assessment:                           - Prior to the procedure, a History and Physical                            was performed, and patient medications and                            allergies were reviewed. The patient's tolerance of                            previous anesthesia was also reviewed. The risks                            and benefits of the procedure and the sedation                            options and risks were discussed with the patient.                            All questions were answered, and informed consent                            was obtained. Prior Anticoagulants: The patient has                            taken no anticoagulant or antiplatelet agents. ASA                            Grade Assessment: III - A patient with severe                            systemic disease. After reviewing the risks and  benefits, the patient was deemed in satisfactory                            condition to undergo the procedure.                           After obtaining informed consent, the endoscope was                            passed under direct vision. Throughout the                            procedure, the patient's blood pressure, pulse, and                             oxygen saturations were monitored continuously. The                            [id number]-[id number] ([id number]) scope was introduced through the                            mouth, and advanced to the second part of duodenum.                            The upper GI endoscopy was accomplished without                            difficulty. The patient tolerated the procedure                            well. Scope In: 9:19:17 AM Scope Out: 9:25:21 AM Total Procedure Duration: 0 hours 6 minutes 4 seconds  Findings:      The examined esophagus was normal.      One 5 mm semi-pedunculated polyp was found on the anterior wall of the       gastric body. The polyp was removed with a cold biopsy forceps.       Resection and retrieval were complete. Estimated blood loss was minimal.      The duodenal bulb and second portion of the duodenum were normal. Impression:               - Normal esophagus.                           - One gastric polyp. Resected and retrieved.                           - Normal duodenal bulb and second portion of the                            duodenum. Moderate Sedation:      Moderate (conscious) sedation was personally administered by an       anesthesia professional. The following parameters were monitored: oxygen       saturation, heart rate, blood pressure, respiratory rate, EKG, adequacy       of pulmonary  ventilation, and response to care. Recommendation:           - Patient has a contact number available for                            emergencies. The signs and symptoms of potential                            delayed complications were discussed with the                            patient. Return to normal activities tomorrow.                            Written discharge instructions were provided to the                            patient.                           - Resume previous diet.                           - Continue present medications. Follow-up on                             pathology. Procedure Code(s):        --- Professional ---                           [phone], Esophagogastroduodenoscopy, flexible,                            transoral; with biopsy, single or multiple Diagnosis Code(s):        --- Professional ---                           K31.7, Polyp of stomach and duodenum                           K92.0, Hematemesis CPT copyright 2022 American Medical Association. All rights reserved. The codes documented in this report are preliminary and upon coder review may  be revised to meet current compliance requirements. Gerrit Friends. Shamar Engelmann, MD Gennette Pac, MD 02/22/2023 9:35:48 AM This report has been signed electronically. Number of Addenda: 0

## 2023-02-22 NOTE — Anesthesia Preprocedure Evaluation (Signed)
Anesthesia Evaluation  Patient identified by MRN, date of birth, ID band Patient awake    Reviewed: Allergy & Precautions, H&P , NPO status , Patient's Chart, lab work & pertinent test results, reviewed documented beta blocker date and time   History of Anesthesia Complications (+) PONV and history of anesthetic complications  Airway Mallampati: II  TM Distance: >3 FB Neck ROM: full    Dental no notable dental hx.    Pulmonary neg pulmonary ROS, former smoker   Pulmonary exam normal breath sounds clear to auscultation       Cardiovascular Exercise Tolerance: Good hypertension, + angina  + CAD  negative cardio ROS  Rhythm:regular Rate:Normal     Neuro/Psych  Headaches TIA Neuromuscular disease No Residual Symptoms  negative psych ROS   GI/Hepatic negative GI ROS, Neg liver ROS,GERD  ,,  Endo/Other  negative endocrine ROS    Renal/GU negative Renal ROS  negative genitourinary   Musculoskeletal   Abdominal   Peds  Hematology negative hematology ROS (+)   Anesthesia Other Findings Spinal cord stim  Reproductive/Obstetrics negative OB ROS                             Anesthesia Physical Anesthesia Plan  ASA: 3  Anesthesia Plan: General   Post-op Pain Management:    Induction:   PONV Risk Score and Plan:   Airway Management Planned:   Additional Equipment:   Intra-op Plan:   Post-operative Plan:   Informed Consent: I have reviewed the patients History and Physical, chart, labs and discussed the procedure including the risks, benefits and alternatives for the proposed anesthesia with the patient or authorized representative who has indicated his/her understanding and acceptance.     Dental Advisory Given  Plan Discussed with: CRNA  Anesthesia Plan Comments:        Anesthesia Quick Evaluation

## 2023-02-22 NOTE — Addendum Note (Signed)
Addendum  created 02/22/23 1013 by Julian Reil, CRNA   Intraprocedure Event edited

## 2023-02-23 LAB — SURGICAL PATHOLOGY

## 2023-02-27 ENCOUNTER — Other Ambulatory Visit: Payer: Self-pay | Admitting: Family Medicine

## 2023-02-27 DIAGNOSIS — I700 Atherosclerosis of aorta: Secondary | ICD-10-CM

## 2023-02-27 DIAGNOSIS — I25118 Atherosclerotic heart disease of native coronary artery with other forms of angina pectoris: Secondary | ICD-10-CM

## 2023-02-27 DIAGNOSIS — E785 Hyperlipidemia, unspecified: Secondary | ICD-10-CM

## 2023-02-28 ENCOUNTER — Encounter: Payer: Self-pay | Admitting: Internal Medicine

## 2023-03-01 NOTE — Telephone Encounter (Signed)
Requested Prescriptions  Pending Prescriptions Disp Refills   atorvastatin (LIPITOR) 80 MG tablet [Pharmacy Med Name: Atorvastatin Calcium 80 MG Oral Tablet] 90 tablet 2    Sig: TAKE 1 TABLET BY MOUTH ONCE DAILY AT 6PM.     Cardiovascular:  Antilipid - Statins Failed - 02/27/2023 11:07 AM      Failed - Valid encounter within last 12 months    Recent Outpatient Visits           1 year ago Coronary artery disease of native artery of native heart with stable angina pectoris (HCC)   [facility] Pickard, Priscille Heidelberg, MD   1 year ago Encounter for smoking cessation counseling   [facility] Valentino Nose, NP   2 years ago Encounter for screening mammogram for malignant neoplasm of breast   [facility] Pickard, Priscille Heidelberg, MD   4 years ago Palpitations   [facility] Danelle Berry, PA-C   4 years ago Acute pain of right shoulder   [facility] Danelle Berry, PA-C       Future Appointments             In 6 months Patwardhan, Anabel Bene, MD [facility]            Failed - Lipid Panel in normal range within the last 12 months    Cholesterol, Total  Date Value Ref Range Status  10/02/2022 123 100 - 199 mg/dL Final   LDL Cholesterol (Calc)  Date Value Ref Range Status  11/11/2021 97 mg/dL (calc) Final    Comment:    Reference range: <100 . Desirable range <100 mg/dL for primary prevention;   <70 mg/dL for patients with CHD or diabetic patients  with > or = 2 CHD risk factors. Marland Kitchen LDL-C is now calculated using the Martin-Hopkins  calculation, which is a validated novel method providing  better accuracy than the Friedewald equation in the  estimation of LDL-C.  Horald Pollen et al. Lenox Ahr. 4098;119(14): 2061-2068  (http://education.[employer].com/faq/FAQ164)    LDL Chol Calc (NIH)  Date Value Ref Range Status  10/02/2022 55 0 - 99 mg/dL Final   HDL  Date Value Ref Range Status   10/02/2022 40 >39 mg/dL Final   Triglycerides  Date Value Ref Range Status  10/02/2022 163 (H) 0 - 149 mg/dL Final         Passed - Patient is not pregnant

## 2023-03-11 ENCOUNTER — Encounter (HOSPITAL_COMMUNITY): Payer: Self-pay | Admitting: Internal Medicine

## 2023-03-24 ENCOUNTER — Encounter: Payer: Self-pay | Admitting: Gastroenterology

## 2023-04-08 ENCOUNTER — Other Ambulatory Visit: Payer: Self-pay | Admitting: Cardiology

## 2023-04-08 DIAGNOSIS — R0609 Other forms of dyspnea: Secondary | ICD-10-CM

## 2023-04-08 DIAGNOSIS — R9439 Abnormal result of other cardiovascular function study: Secondary | ICD-10-CM

## 2023-04-08 DIAGNOSIS — I2510 Atherosclerotic heart disease of native coronary artery without angina pectoris: Secondary | ICD-10-CM

## 2023-05-16 ENCOUNTER — Other Ambulatory Visit: Payer: Self-pay | Admitting: Gastroenterology

## 2023-05-16 DIAGNOSIS — K5900 Constipation, unspecified: Secondary | ICD-10-CM

## 2023-05-26 ENCOUNTER — Other Ambulatory Visit: Payer: Self-pay | Admitting: Family Medicine

## 2023-05-26 DIAGNOSIS — I25118 Atherosclerotic heart disease of native coronary artery with other forms of angina pectoris: Secondary | ICD-10-CM

## 2023-06-07 ENCOUNTER — Telehealth: Payer: Self-pay | Admitting: Cardiology

## 2023-06-07 MED ORDER — NITROGLYCERIN 0.4 MG SL SUBL: 0.4000 mg | SUBLINGUAL_TABLET | SUBLINGUAL | 1 refills | Status: AC | PRN

## 2023-06-07 NOTE — Telephone Encounter (Signed)
*  STAT* If patient is at the pharmacy, call can be transferred to refill team.   1. Which medications need to be refilled? (please list name of each medication and dose if known)   nitroGLYCERIN (NITROSTAT) 0.4 MG SL tablet    2. Which pharmacy/location (including street and city if local pharmacy) is medication to be sent to?[facility] - [place], [place] - [address]   3. Do they need a 30 day or 90 day supply?  90 day

## 2023-08-16 ENCOUNTER — Telehealth: Payer: Self-pay

## 2023-08-16 ENCOUNTER — Other Ambulatory Visit: Payer: Self-pay | Admitting: Gastroenterology

## 2023-08-16 DIAGNOSIS — K5900 Constipation, unspecified: Secondary | ICD-10-CM

## 2023-08-16 MED ORDER — OMEPRAZOLE 40 MG PO CPDR
40.0000 mg | DELAYED_RELEASE_CAPSULE | Freq: Two times a day (BID) | ORAL | 1 refills | Status: DC
Start: 2023-08-16 — End: 2024-02-09

## 2023-08-16 NOTE — Telephone Encounter (Signed)
 Rx sent

## 2023-08-16 NOTE — Telephone Encounter (Signed)
 Refill request was received via fax from [facility] for OMEPRAZOLE DR 40 MG Qty: 180. Pt was last seen on 01/21/2023.

## 2023-08-24 ENCOUNTER — Other Ambulatory Visit: Payer: Self-pay | Admitting: Family Medicine

## 2023-08-24 DIAGNOSIS — I25118 Atherosclerotic heart disease of native coronary artery with other forms of angina pectoris: Secondary | ICD-10-CM

## 2023-08-25 ENCOUNTER — Encounter: Payer: Self-pay | Admitting: *Deleted

## 2023-08-25 NOTE — Telephone Encounter (Signed)
 Requested medication (s) are due for refill today:   Yes  Requested medication (s) are on the active medication list:   Yes  Future visit scheduled:   No.    LOV 7/25/20224 with Amber   MyChart message sent to make 6 month check up appt.    Last ordered: 05/26/2023 #90, 0 refills  Returned because 6 month OV due per protocol.     Requested Prescriptions  Pending Prescriptions Disp Refills   amLODipine (NORVASC) 2.5 MG tablet [Pharmacy Med Name: amLODIPine Besylate 2.5 MG Oral Tablet] 90 tablet 0    Sig: Take 1 tablet by mouth once daily     Cardiovascular: Calcium Channel Blockers 2 Failed - 08/25/2023  8:36 AM      Failed - Valid encounter within last 6 months    Recent Outpatient Visits           1 year ago Coronary artery disease of native artery of native heart with stable angina pectoris (HCC)   [facility] Pickard, Priscille Heidelberg, MD   2 years ago Encounter for smoking cessation counseling   [facility] Medicine Valentino Nose, NP   3 years ago Encounter for screening mammogram for malignant neoplasm of breast   [facility] Pickard, Priscille Heidelberg, MD   4 years ago Palpitations   [facility] Danelle Berry, PA-C   5 years ago Acute pain of right shoulder   [facility] Danelle Berry, PA-C       Future Appointments             In 1 month Patwardhan, Anabel Bene, MD [facility]            Passed - Last BP in normal range    BP Readings from Last 1 Encounters:  02/22/23 126/83         Passed - Last Heart Rate in normal range    Pulse Readings from Last 1 Encounters:  02/22/23 80

## 2023-09-08 ENCOUNTER — Encounter (INDEPENDENT_AMBULATORY_CARE_PROVIDER_SITE_OTHER): Payer: Self-pay

## 2023-09-20 ENCOUNTER — Ambulatory Visit: Admitting: Family Medicine

## 2023-09-20 ENCOUNTER — Encounter: Payer: Self-pay | Admitting: Family Medicine

## 2023-09-20 VITALS — BP 120/64 | HR 80 | Temp 97.9°F | Ht 62.0 in | Wt 166.0 lb

## 2023-09-20 DIAGNOSIS — I10 Essential (primary) hypertension: Secondary | ICD-10-CM

## 2023-09-20 DIAGNOSIS — Z0000 Encounter for general adult medical examination without abnormal findings: Secondary | ICD-10-CM

## 2023-09-20 DIAGNOSIS — I25118 Atherosclerotic heart disease of native coronary artery with other forms of angina pectoris: Secondary | ICD-10-CM | POA: Diagnosis not present

## 2023-09-20 DIAGNOSIS — Z8774 Personal history of (corrected) congenital malformations of heart and circulatory system: Secondary | ICD-10-CM

## 2023-09-20 DIAGNOSIS — Z8673 Personal history of transient ischemic attack (TIA), and cerebral infarction without residual deficits: Secondary | ICD-10-CM | POA: Diagnosis not present

## 2023-09-20 DIAGNOSIS — Z0001 Encounter for general adult medical examination with abnormal findings: Secondary | ICD-10-CM | POA: Diagnosis not present

## 2023-09-20 MED ORDER — ISOSORBIDE MONONITRATE ER 30 MG PO TB24: 30.0000 mg | ORAL_TABLET | Freq: Every day | ORAL | 1 refills | Status: DC

## 2023-09-20 NOTE — Progress Notes (Signed)
 Subjective:    Patient ID: Jessica Shaw, female    DOB: 1965-12-01, 58 y.o.   MRN: [id number]  HPI  Patient is a very pleasant 58 year old Caucasian female who is here today for complete physical exam.  She has a history of a stroke and ultimately underwent a PFO closure.  She also has a history of nonobstructive coronary artery disease catheterization in 2020. She also has a history of hyperlipidemia for which she is on statin as well as tobacco abuse.  Mammogram was performed in 5/24.  Colonoscopy was performed 8/23.  Last pap smear was 5/23 and was normal.  However, the patient has been having chest pain.  She stated that starting a few months ago she developed increasing shortness of breath and dyspnea on exertion.  She stopped going to the gym because she was becoming more easily winded.  Also over the last few weeks she has developed occasional chest pain.  The pain occurs to the left side of her sternum.  He will occasionally radiate into her back.  It will last for a few minutes and then go away.  She describes it as a mild pressure-like pain.  The worst episode occurred while she was planting flowers a few days ago.  She had to stop and sit down and rest for the pain to go away.  At the present time she is pain-free.  Patient had a catheterization in 2020 that revealed a 50% blockage in the LAD and a 40% blockage in branch artery. Past Medical History:  Diagnosis Date   Complication of anesthesia    Coronary artery disease    PFO closure 2006   Embolism - blood clot    arm   left    GERD (gastroesophageal reflux disease)    HLD (hyperlipidemia)    Migraine    PONV (postoperative nausea and vomiting)    Smoker    Spinal cord stimulator status 2006   in lower back, pt patient   Stroke [facility])    TIA, no deficits   Past Surgical History:  Procedure Laterality Date   CARDIAC CATHETERIZATION     CHOLECYSTECTOMY     COLONOSCOPY  08/04/2005   YQM:VHQIO external hemorrhoids and  anal papilla, otherwise normal   COLONOSCOPY N/A 10/07/2012   Procedure: COLONOSCOPY;  Surgeon: Corbin Ade, MD;  [facility];  Service: Endoscopy;  Laterality: N/A;  10:30   COLONOSCOPY N/A 10/13/2016   Procedure: COLONOSCOPY;  Surgeon: Corbin Ade, MD;  [facility];  Service: Endoscopy;  Laterality: N/A;  9:00am   COLONOSCOPY WITH PROPOFOL N/A 02/09/2022   Procedure: COLONOSCOPY WITH PROPOFOL;  Surgeon: Corbin Ade, MD;  [facility];  Service: Endoscopy;  Laterality: N/A;  11:15am   CORONARY PRESSURE/FFR STUDY N/A 03/14/2019   Procedure: INTRAVASCULAR PRESSURE WIRE/FFR STUDY;  Surgeon: Elder Negus, MD;  Location: [facility];  Service: Cardiovascular;  Laterality: N/A;   ESOPHAGOGASTRODUODENOSCOPY  04/20/2003   NGE:XBMW changes of reflux esophagitis/Limited gastroesophageal junction, otherwise normal   ESOPHAGOGASTRODUODENOSCOPY N/A 10/07/2012   Procedure: ESOPHAGOGASTRODUODENOSCOPY (EGD);  Surgeon: Corbin Ade, MD;  [facility];  Service: Endoscopy;  Laterality: N/A;   ESOPHAGOGASTRODUODENOSCOPY (EGD) WITH PROPOFOL N/A 02/22/2023   Procedure: ESOPHAGOGASTRODUODENOSCOPY (EGD) WITH PROPOFOL;  Surgeon: Corbin Ade, MD;  [facility];  Service: Endoscopy;  Laterality: N/A;  230pm, asa 3   LEFT HEART CATH AND CORONARY ANGIOGRAPHY N/A 03/14/2019   Procedure: LEFT  HEART CATH AND CORONARY ANGIOGRAPHY;  Surgeon: Elder Negus, MD;  Location: [facility];  Service: Cardiovascular;  Laterality: N/A;   PATENT FORAMEN OVALE CLOSURE     POLYPECTOMY  10/13/2016   Procedure: POLYPECTOMY;  Surgeon: Corbin Ade, MD;  [facility];  Service: Endoscopy;;  colon   POLYPECTOMY  02/22/2023   Procedure: POLYPECTOMY;  Surgeon: Corbin Ade, MD;  [facility];  Service: Endoscopy;;   SHOULDER ARTHROSCOPY     left   SPINAL CORD STIMULATOR IMPLANT     TUBAL LIGATION     WRIST ARTHROSCOPY WITH  DEBRIDEMENT Right 06/23/2018   Procedure: RIGHT ARTHROSCOPY WRIST DEBRIDEMENT LUNO TRIQUETRAL TEAR SHRINKAGE;  Surgeon: Cindee Salt, MD;  Location: [facility];  Service: Orthopedics;  Laterality: Right;  AXILLARY BLAOCK   Current Outpatient Medications on File Prior to Visit  Medication Sig Dispense Refill   amLODipine (NORVASC) 2.5 MG tablet Take 1 tablet by mouth once daily 90 tablet 0   aspirin EC 81 MG tablet Take 81 mg by mouth daily. Swallow whole.     atorvastatin (LIPITOR) 80 MG tablet TAKE 1 TABLET BY MOUTH ONCE DAILY AT 6PM. 90 tablet 2   linaclotide (LINZESS) 290 MCG CAPS capsule Take 1 capsule (290 mcg total) by mouth daily before breakfast. 30 capsule 3   metoprolol succinate (TOPROL-XL) 50 MG 24 hr tablet TAKE 1 TABLET BY MOUTH ONCE DAILY WITH OR IMMEDIATELY FOLLOWING A MEAL 90 tablet 1   nitroGLYCERIN (NITROSTAT) 0.4 MG SL tablet Place 1 tablet (0.4 mg total) under the tongue every 5 (five) minutes as needed for chest pain. 75 tablet 1   Omega-3 Fatty Acids (FISH OIL) 1000 MG CAPS Take 1,000 mg by mouth daily.     omeprazole (PRILOSEC) 40 MG capsule Take 1 capsule (40 mg total) by mouth 2 (two) times daily before a meal. 180 capsule 1   No current facility-administered medications on file prior to visit.   Allergies  Allergen Reactions   Meperidine Hcl     Other Reaction(s): abdominal pain   Morphine     Other Reaction(s): abdominal pain   Morphine And Codeine Other (See Comments)    G.I. Upset   Demerol [Meperidine]     Gi upset   Latex Itching and Rash   Social History   Socioeconomic History   Marital status: Married    Spouse name: Lamika Connolly   Number of children: 2   Years of education: 12   Highest education level: Some college, no degree  Occupational History   Occupation: Dentist: [employer]  Tobacco Use   Smoking status: Former    Current packs/day: 0.00    Average packs/day: 0.5 packs/day for 36.0 years (18.0 ttl  pk-yrs)    Types: Cigarettes    Start date: 05/1985    Quit date: 05/2021    Years since quitting: 2.3    Passive exposure: Past   Smokeless tobacco: Never  Vaping Use   Vaping status: Every Day   Substances: Nicotine, Flavoring  Substance and Sexual Activity   Alcohol use: No   Drug use: No   Sexual activity: Yes    Partners: Male  Other Topics Concern   Not on file  Social History Narrative         She works as a Production designer, 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   Does no exercise.   Has smoked about 1/2  ppd since age 53 years old-- now this has been for 30 years.(as of 2015)   Social Drivers of Corporate investment banker Strain: Low Risk  (09/17/2023)   Overall Financial Resource Strain (CARDIA)    Difficulty of Paying Living Expenses: Not hard at all  Food Insecurity: No Food Insecurity (09/17/2023)   Hunger Vital Sign    Worried About Running Out of Food in the Last Year: Never true    Ran Out of Food in the Last Year: Never true  Transportation Needs: No Transportation Needs (09/17/2023)   PRAPARE - Administrator, Civil Service (Medical): No    Lack of Transportation (Non-Medical): No  Physical Activity: Insufficiently Active (09/17/2023)   Exercise Vital Sign    Days of Exercise per Week: 4 days    Minutes of Exercise per Session: 30 min  Stress: No Stress Concern Present (09/17/2023)   [employer] of Occupational Health - Occupational Stress Questionnaire    Feeling of Stress : Only a little  Social Connections: Moderately Integrated (09/17/2023)   Social Connection and Isolation Panel [NHANES]    Frequency of Communication with Friends and Family: Twice a week    Frequency of Social Gatherings with Friends and Family: Three times a week    Attends Religious Services: More than 4 times per year    Active Member of Clubs or Organizations: No    Attends Banker Meetings: Not on file    Marital Status: Married  Catering manager  Violence: Not At Risk (01/29/2022)   Humiliation, Afraid, Rape, and Kick questionnaire    Fear of Current or Ex-Partner: No    Emotionally Abused: No    Physically Abused: No    Sexually Abused: No   Family History  Problem Relation Age of Onset   Diabetes Father    Heart attack Father    Diabetes Brother    Hypertension Brother    Diabetes Brother    Hypertension Brother    Diabetes Brother    Diabetes Brother    Diabetes Brother    Cancer Mother        unsure primary, deceased at age 98   Diabetes Other    Colon cancer Neg Hx       Review of Systems  All other systems reviewed and are negative.      Objective:   Physical Exam Constitutional:      General: She is not in acute distress.    Appearance: Normal appearance. She is not ill-appearing or toxic-appearing.  HENT:     Head: Normocephalic and atraumatic.     Right Ear: Tympanic membrane and ear canal normal.     Left Ear: Tympanic membrane and ear canal normal.     Nose: No congestion or rhinorrhea.     Mouth/Throat:     Mouth: Mucous membranes are moist.     Pharynx: Oropharynx is clear. No oropharyngeal exudate or posterior oropharyngeal erythema.  Eyes:     Conjunctiva/sclera: Conjunctivae normal.     Pupils: Pupils are equal, round, and reactive to light.  Neck:     Vascular: No carotid bruit.  Cardiovascular:     Rate and Rhythm: Normal rate and regular rhythm.     Heart sounds: Normal heart sounds. No murmur heard.    No friction rub. No gallop.  Pulmonary:     Effort: Pulmonary effort is normal. No respiratory distress.     Breath sounds: Normal breath sounds. No stridor.  No wheezing, rhonchi or rales.  Abdominal:     General: Bowel sounds are normal. There is no distension.     Palpations: Abdomen is soft.     Tenderness: There is no abdominal tenderness. There is no guarding or rebound.  Musculoskeletal:     Cervical back: Normal range of motion. No rigidity.  Lymphadenopathy:     Cervical:  No cervical adenopathy.  Skin:    Findings: No rash.  Neurological:     General: No focal deficit present.     Mental Status: She is alert and oriented to person, place, and time. Mental status is at baseline.     Cranial Nerves: No cranial nerve deficit.     Motor: No weakness.     Coordination: Coordination normal.     Gait: Gait normal.     Deep Tendon Reflexes: Reflexes normal.  Psychiatric:        Mood and Affect: Mood normal.        Behavior: Behavior normal.        Thought Content: Thought content normal.        Judgment: Judgment normal.           Assessment & Plan:   General medical exam  Coronary artery disease of native artery of native heart with stable angina pectoris (HCC) - Plan: CBC with Differential/Platelet, COMPLETE METABOLIC PANEL WITHOUT GFR, Lipid panel  S/P patent foramen ovale closure  Essential hypertension  History of CVA (cerebrovascular accident) Patient's colonoscopy mammogram and Pap smear are up-to-date.  However I am very concerned that her symptoms represent angina.  She is pain-free at present.  She has an appointment to see her cardiologist on Friday.  I see no reason to send her to the emergency room right now.  She is already on max dose statin, beta-blocker, and aspirin.  I will add Imdur 30 mg a day as vasodilator until seen by cardiology.  Recommended no strenuous activity and if the chest pain returns she is to go to the emergency room immediately.  Meanwhile check CBC CMP and a lipid panel.  Blood pressure today is excellent.  Goal LDL cholesterol be less than 55.

## 2023-09-21 LAB — COMPLETE METABOLIC PANEL WITHOUT GFR
AG Ratio: 2.2 (calc) (ref 1.0–2.5)
ALT: 23 U/L (ref 6–29)
AST: 18 U/L (ref 10–35)
Albumin: 4.7 g/dL (ref 3.6–5.1)
Alkaline phosphatase (APISO): 55 U/L (ref 37–153)
BUN: 20 mg/dL (ref 7–25)
CO2: 27 mmol/L (ref 20–32)
Calcium: 9.8 mg/dL (ref 8.6–10.4)
Chloride: 105 mmol/L (ref 98–110)
Creat: 0.6 mg/dL (ref 0.50–1.03)
Globulin: 2.1 g/dL (ref 1.9–3.7)
Glucose, Bld: 100 mg/dL — ABNORMAL HIGH (ref 65–99)
Potassium: 4.6 mmol/L (ref 3.5–5.3)
Sodium: 139 mmol/L (ref 135–146)
Total Bilirubin: 0.5 mg/dL (ref 0.2–1.2)
Total Protein: 6.8 g/dL (ref 6.1–8.1)

## 2023-09-21 LAB — CBC WITH DIFFERENTIAL/PLATELET
Absolute Lymphocytes: 3447 {cells}/uL (ref 850–3900)
Absolute Monocytes: 630 {cells}/uL (ref 200–950)
Basophils Absolute: 81 {cells}/uL (ref 0–200)
Basophils Relative: 0.9 %
Eosinophils Absolute: 315 {cells}/uL (ref 15–500)
Eosinophils Relative: 3.5 %
HCT: 41.7 % (ref 35.0–45.0)
Hemoglobin: 14.1 g/dL (ref 11.7–15.5)
MCH: 29.9 pg (ref 27.0–33.0)
MCHC: 33.8 g/dL (ref 32.0–36.0)
MCV: 88.3 fL (ref 80.0–100.0)
MPV: 10.7 fL (ref 7.5–12.5)
Monocytes Relative: 7 %
Neutro Abs: 4527 {cells}/uL (ref 1500–7800)
Neutrophils Relative %: 50.3 %
Platelets: 293 10*3/uL (ref 140–400)
RBC: 4.72 10*6/uL (ref 3.80–5.10)
RDW: 12.6 % (ref 11.0–15.0)
Total Lymphocyte: 38.3 %
WBC: 9 10*3/uL (ref 3.8–10.8)

## 2023-09-21 LAB — LIPID PANEL
Cholesterol: 134 mg/dL (ref <200)
HDL: 49 mg/dL — ABNORMAL LOW (ref >=50)
LDL Cholesterol (Calc): 60 mg/dL
Non-HDL Cholesterol (Calc): 85 mg/dL (ref <130)
Total CHOL/HDL Ratio: 2.7 (calc) (ref <5.0)
Triglycerides: 174 mg/dL — ABNORMAL HIGH (ref <150)

## 2023-09-24 ENCOUNTER — Ambulatory Visit: Payer: Self-pay | Admitting: Cardiology

## 2023-09-24 ENCOUNTER — Ambulatory Visit: Payer: BC Managed Care – PPO | Attending: Cardiology | Admitting: Cardiology

## 2023-09-24 ENCOUNTER — Other Ambulatory Visit (HOSPITAL_COMMUNITY): Payer: Self-pay

## 2023-09-24 ENCOUNTER — Encounter: Payer: Self-pay | Admitting: Cardiology

## 2023-09-24 VITALS — BP 138/77 | HR 94 | Resp 16 | Ht 62.0 in | Wt 162.0 lb

## 2023-09-24 DIAGNOSIS — I25118 Atherosclerotic heart disease of native coronary artery with other forms of angina pectoris: Secondary | ICD-10-CM | POA: Diagnosis not present

## 2023-09-24 DIAGNOSIS — E782 Mixed hyperlipidemia: Secondary | ICD-10-CM

## 2023-09-24 DIAGNOSIS — R0609 Other forms of dyspnea: Secondary | ICD-10-CM

## 2023-09-24 DIAGNOSIS — R072 Precordial pain: Secondary | ICD-10-CM | POA: Insufficient documentation

## 2023-09-24 DIAGNOSIS — I10 Essential (primary) hypertension: Secondary | ICD-10-CM | POA: Diagnosis not present

## 2023-09-24 MED ORDER — METOPROLOL TARTRATE 100 MG PO TABS: 100.0000 mg | ORAL_TABLET | Freq: Once | ORAL | 0 refills | Status: DC

## 2023-09-24 MED ORDER — IVABRADINE HCL 5 MG PO TABS
15.0000 mg | ORAL_TABLET | Freq: Once | ORAL | 0 refills | Status: DC
  Filled 2023-09-24: qty 3, 1d supply, fill #0

## 2023-09-24 NOTE — Progress Notes (Signed)
  Cardiology Office Note:  .   Date:  09/24/2023  ID:  [name], DOB [birth year], MRN [id number] PCP: Donita Brooks, MD  [facility] Providers Cardiologist:  Truett Mainland, MD PCP: Donita Brooks, MD  Chief Complaint  Patient presents with   Coronary artery disease of native artery of native heart wi   Follow-up    1 year     Jessica Shaw is a 58 y.o. female with history of stroke and PFO, s/p PFO (2006), nonobstructive CAD, h/o DVT   Patient has had recent episodes of chest pressure and exertional dyspnea.  Chest pressure is not particularly worse with physical exertion, but she has also had some arm pains that may or may not be related to exertion.  She is compliant with her medical therapy, reviewed recent lab results with the patient, details below.  She has also noticed exertional dyspnea.    Vitals:   09/24/23 1546  BP: 138/77  Pulse: 94  Resp: 16  SpO2: 96%      Review of Systems  Cardiovascular:  Positive for chest pain and dyspnea on exertion. Negative for leg swelling, palpitations and syncope.        Studies Reviewed: Marland Kitchen        EKG 09/24/2023: Normal sinus rhythm Normal ECG When compared with ECG of 14-Mar-2019 15:16, Nonspecific T wave abnormality now evident in Anterior leads    Independently interpreted 09/2023: Chol 134, TG 174, HDL 49, LDL 60 Hb 14.1 Cr 0.6   Coronary angiography 03/14/2019: LM: Normal LAD: Mid LAD 50% and ostial Diag 40% stenosis at LAD/Diag bifurcation. LAD tapers rapidly in size after the bifurcation. dFR LAD: 0.90 Diag 0.97 LCx: Normal RCA: Distal RCA 20% disease.   LVEDP normal.   Moderate nonobstructive epicardial CAD. Possible microvascular disease, as evidenced by slow TIMI 2 flow in all vessels. Recommend medical management.    Physical Exam   VISIT DIAGNOSES:   ICD-10-CM   1. Coronary artery disease of native artery of native heart with stable angina pectoris (HCC)   I25.118 EKG 12-Lead    2. Mixed hyperlipidemia  E78.2     3. Essential hypertension  I10        Jessica Shaw is a 58 y.o. female with  history of stroke and PFO, s/p closure (2006), nonobstructive CAD, h/o DVT  Assessment & Plan  Hypertension: Controlled   CAD: Continue Aspirin, statin, amlodipine, metoprolol. Recent atypical chest pain and exertional dyspnea. Recommend coronary CT angiogram and echocardiogram.   Meds ordered this encounter  Medications   DISCONTD: ivabradine (CORLANOR) 5 MG TABS tablet    Sig: Take 3 tablets (15 mg total) by mouth once for 1 dose. Take 90-120 minutes prior to scan.    Dispense:  3 tablet    Refill:  0   metoprolol tartrate (LOPRESSOR) 100 MG tablet    Sig: Take 1 tablet (100 mg total) by mouth once for 1 dose. Take 90-120 minutes prior to scan    Dispense:  1 tablet    Refill:  0     F/u in 3 months  Signed, Elder Negus, MD

## 2023-09-24 NOTE — Patient Instructions (Addendum)
 Testing/Procedures: Echo  Your physician has requested that you have an echocardiogram. Echocardiography is a painless test that uses sound waves to create images of your heart. It provides your doctor with information about the size and shape of your heart and how well your heart's chambers and valves are working. This procedure takes approximately one hour. There are no restrictions for this procedure. Please do NOT wear cologne, perfume, aftershave, or lotions (deodorant is allowed). Please arrive 15 minutes prior to your appointment time.  Please note: We ask at that you not bring children with you during ultrasound (echo/ vascular) testing. Due to room size and safety concerns, children are not allowed in the ultrasound rooms during exams. Our front office staff cannot provide observation of children in our lobby area while testing is being conducted. An adult accompanying a patient to their appointment will only be allowed in the ultrasound room at the discretion of the ultrasound technician under special circumstances. We apologize for any inconvenience.   CTA  Your physician has requested that you have cardiac CT. Cardiac computed tomography (CT) is a painless test that uses an x-ray machine to take clear, detailed pictures of your heart. For further information please visit [email or web address] Please follow instruction sheet as given.    Follow-Up: At [facility], you and your health needs are our priority.  As part of our continuing mission to provide you with exceptional heart care, our providers are all part of one team.  This team includes your primary Cardiologist (physician) and Advanced Practice Providers or APPs (Physician Assistants and Nurse Practitioners) who all work together to provide you with the care you need, when you need it.  Your next appointment:   3 month(s)  Provider:   Elder Negus, MD     We recommend signing up for the patient portal called  "MyChart".  Sign up information is provided on this After Visit Summary.  MyChart is used to connect with patients for Virtual Visits (Telemedicine).  Patients are able to view lab/test results, encounter notes, upcoming appointments, etc.  Non-urgent messages can be sent to your provider as well.   To learn more about what you can do with MyChart, go to [email or web address].   Other Instructions   Your cardiac CT will be scheduled at one of the below locations:   [facility] [address] [place], [place] [address] [phone]  If scheduled at [facility], please arrive at the [facility] and Children's Entrance (Entrance C2) of [facility] 30 minutes prior to test start time. You can use the FREE valet parking offered at entrance C (encouraged to control the heart rate for the test)  Proceed to the [facility] Radiology Department (first floor) to check-in and test prep.  All radiology patients and guests should use entrance C2 at [facility], accessed from [facility], even though the hospital's physical address listed is [address].    If scheduled at [facility] or [facility], please arrive 15 mins early for check-in and test prep.  There is spacious parking and easy access to the radiology department from the [facility] entrance. Please enter here and check-in with the desk attendant.   Please follow these instructions carefully (unless otherwise directed):  An IV will be required for this test and Nitroglycerin will be given.  Hold all erectile dysfunction medications at least 3 days (72 hrs) prior to test. (Ie  viagra, cialis, sildenafil, tadalafil, etc)   On the Night Before the Test: Be sure to Drink plenty of water. Do not consume any caffeinated/decaffeinated beverages or chocolate 12 hours prior to your test. Do not take any antihistamines 12 hours  prior to your test.   On the Day of the Test: Drink plenty of water until 1 hour prior to the test. Do not eat any food 1 hour prior to test. You may take your regular medications prior to the test.  Take Metoprolol tartrate 100 mg two hours prior to test. HOLD METOPROLOL DAY OF TEST AND RESTART FOLLOWING DAY.   Resting HR 60-65 bpm and BP >110/50 mmHG  Metoprolol tartrate 50 mg PO 90-120 minutes prior to scan   Resting HR > 65 bpm and BP >110/50 mmHG  Metoprolol tartrate 100 mg PO 90-120 minutes prior to scan   If you take Furosemide/Hydrochlorothiazide/Spironolactone, please HOLD on the morning of the test. FEMALES- please wear underwire-free bra if available, avoid dresses & tight clothing  After the Test: Drink plenty of water. After receiving IV contrast, you may experience a mild flushed feeling. This is normal. On occasion, you may experience a mild rash up to 24 hours after the test. This is not dangerous. If this occurs, you can take Benadryl 25 mg and increase your fluid intake. If you experience trouble breathing, this can be serious. If it is severe call 911 IMMEDIATELY. If it is mild, please call our office. If you take any of these medications: Glipizide/Metformin, Avandament, Glucavance, please do not take 48 hours after completing test unless otherwise instructed.  We will call to schedule your test 2-4 weeks out understanding that some insurance companies will need an authorization prior to the service being performed.   For more information and frequently asked questions, please visit our website : [email or web address]  For non-scheduling related questions, please contact the cardiac imaging nurse navigator should you have any questions/concerns: Cardiac Imaging Nurse Navigators Direct Office Dial: [phone]   For scheduling needs, including cancellations and rescheduling, please call [place], [phone].       1st Floor: - Lobby -  Registration  - Pharmacy  - Lab - Cafe  2nd Floor: - PV Lab - Diagnostic Testing (echo, CT, nuclear med)  3rd Floor: - Vacant  4th Floor: - TCTS (cardiothoracic surgery) - AFib Clinic - Structural Heart Clinic - Vascular Surgery  - Vascular Ultrasound  5th Floor: - HeartCare Cardiology (general and EP) - Clinical Pharmacy for coumadin, hypertension, lipid, weight-loss medications, and med management appointments

## 2023-10-06 ENCOUNTER — Other Ambulatory Visit: Payer: Self-pay | Admitting: Cardiology

## 2023-10-06 DIAGNOSIS — I2510 Atherosclerotic heart disease of native coronary artery without angina pectoris: Secondary | ICD-10-CM

## 2023-10-06 DIAGNOSIS — R0609 Other forms of dyspnea: Secondary | ICD-10-CM

## 2023-10-06 DIAGNOSIS — R9439 Abnormal result of other cardiovascular function study: Secondary | ICD-10-CM

## 2023-10-14 ENCOUNTER — Encounter (HOSPITAL_COMMUNITY): Payer: Self-pay

## 2023-10-15 ENCOUNTER — Ambulatory Visit (HOSPITAL_COMMUNITY)
Admission: RE | Admit: 2023-10-15 | Discharge: 2023-10-15 | Disposition: A | Discharge status: Home / Self Care | Source: Ambulatory Visit | Attending: Cardiology | Admitting: Cardiology

## 2023-10-15 DIAGNOSIS — R072 Precordial pain: Secondary | ICD-10-CM

## 2023-10-15 DIAGNOSIS — I2510 Atherosclerotic heart disease of native coronary artery without angina pectoris: Secondary | ICD-10-CM | POA: Diagnosis not present

## 2023-10-15 MED ORDER — NITROGLYCERIN 0.4 MG SL SUBL
SUBLINGUAL_TABLET | SUBLINGUAL | Status: AC
  Filled 2023-10-15: qty 2

## 2023-10-15 MED ORDER — IOHEXOL 350 MG/ML SOLN
100.0000 mL | Freq: Once | INTRAVENOUS | Status: AC | PRN
  Administered 2023-10-15: 100 mL via INTRAVENOUS

## 2023-10-18 ENCOUNTER — Encounter: Payer: Self-pay | Admitting: Cardiology

## 2023-10-28 ENCOUNTER — Ambulatory Visit (HOSPITAL_COMMUNITY)
Admission: RE | Admit: 2023-10-28 | Discharge: 2023-10-28 | Disposition: A | Discharge status: Home / Self Care | Source: Ambulatory Visit | Attending: Cardiovascular Disease | Admitting: Cardiovascular Disease

## 2023-10-28 DIAGNOSIS — R0609 Other forms of dyspnea: Secondary | ICD-10-CM | POA: Insufficient documentation

## 2023-10-28 LAB — ECHOCARDIOGRAM COMPLETE
AR max vel: 2.32 cm2
AV Area VTI: 2.04 cm2
AV Area mean vel: 2.19 cm2
AV Mean grad: 3 mmHg
AV Peak grad: 6.3 mmHg
Ao pk vel: 1.25 m/s
Area-P 1/2: 3.68 cm2
S' Lateral: 2.5 cm

## 2023-10-29 ENCOUNTER — Ambulatory Visit: Payer: Self-pay | Admitting: Cardiology

## 2023-10-29 NOTE — Progress Notes (Signed)
 Essentially normal echo with good closure of the PFO

## 2023-11-19 ENCOUNTER — Other Ambulatory Visit: Payer: Self-pay | Admitting: Family Medicine

## 2023-11-19 DIAGNOSIS — I25118 Atherosclerotic heart disease of native coronary artery with other forms of angina pectoris: Secondary | ICD-10-CM

## 2023-11-19 NOTE — Telephone Encounter (Signed)
 Requested Prescriptions  Pending Prescriptions Disp Refills   amLODipine  (NORVASC ) 2.5 MG tablet [Pharmacy Med Name: amLODIPine  Besylate 2.5 MG Oral Tablet] 90 tablet 0    Sig: Take 1 tablet by mouth once daily     Cardiovascular: Calcium  Channel Blockers 2 Failed - 11/19/2023  4:14 PM      Failed - Valid encounter within last 6 months    Recent Outpatient Visits           2 months ago General medical exam   [facility] Austine Lefort, MD   10 months ago Gastroenteritis   [place] [facility] Jenelle Mis, FNP   1 year ago Mass of upper inner quadrant of left breast   [place] [facility] Pickard, Cisco Crest, MD       Future Appointments             In 1 month Patwardhan, Kaye Parsons, MD [facility] at [facility] Dept of [facility], H&V            Passed - Last BP in normal range    BP Readings from Last 1 Encounters:  10/15/23 (!) 107/56         Passed - Last Heart Rate in normal range    Pulse Readings from Last 1 Encounters:  10/15/23 72          isosorbide  mononitrate (IMDUR ) 30 MG 24 hr tablet [Pharmacy Med Name: Isosorbide  Mononitrate ER 30 MG Oral Tablet Extended Release 24 Hour] 30 tablet 0    Sig: Take 1 tablet by mouth once daily     Cardiovascular:  Nitrates Passed - 11/19/2023  4:14 PM      Passed - Last BP in normal range    BP Readings from Last 1 Encounters:  10/15/23 (!) 107/56         Passed - Last Heart Rate in normal range    Pulse Readings from Last 1 Encounters:  10/15/23 72         Passed - Valid encounter within last 12 months    Recent Outpatient Visits           2 months ago General medical exam   [facility] Austine Lefort, MD   10 months ago Gastroenteritis   [place] [facility] Jenelle Mis, FNP   1 year ago Mass of upper inner quadrant of left breast   [facility] Pickard, Cisco Crest, MD       Future Appointments             In 1 month Patwardhan, Kaye Parsons, MD [facility], H&V

## 2023-11-21 ENCOUNTER — Other Ambulatory Visit: Payer: Self-pay | Admitting: Family Medicine

## 2023-11-21 DIAGNOSIS — E785 Hyperlipidemia, unspecified: Secondary | ICD-10-CM

## 2023-11-21 DIAGNOSIS — I700 Atherosclerosis of aorta: Secondary | ICD-10-CM

## 2023-11-21 DIAGNOSIS — I25118 Atherosclerotic heart disease of native coronary artery with other forms of angina pectoris: Secondary | ICD-10-CM

## 2023-12-14 DIAGNOSIS — Z0189 Encounter for other specified special examinations: Secondary | ICD-10-CM | POA: Diagnosis not present

## 2023-12-17 ENCOUNTER — Other Ambulatory Visit: Payer: Self-pay | Admitting: Family Medicine

## 2024-01-02 ENCOUNTER — Other Ambulatory Visit: Payer: Self-pay | Admitting: Cardiology

## 2024-01-02 DIAGNOSIS — R9439 Abnormal result of other cardiovascular function study: Secondary | ICD-10-CM

## 2024-01-02 DIAGNOSIS — R0609 Other forms of dyspnea: Secondary | ICD-10-CM

## 2024-01-02 DIAGNOSIS — I2510 Atherosclerotic heart disease of native coronary artery without angina pectoris: Secondary | ICD-10-CM

## 2024-01-10 ENCOUNTER — Ambulatory Visit: Attending: Cardiology | Admitting: Cardiology

## 2024-01-10 ENCOUNTER — Encounter: Payer: Self-pay | Admitting: Cardiology

## 2024-01-10 VITALS — BP 116/64 | HR 67 | Ht 62.0 in | Wt 163.0 lb

## 2024-01-10 DIAGNOSIS — E782 Mixed hyperlipidemia: Secondary | ICD-10-CM | POA: Diagnosis not present

## 2024-01-10 DIAGNOSIS — R079 Chest pain, unspecified: Secondary | ICD-10-CM | POA: Insufficient documentation

## 2024-01-10 DIAGNOSIS — I10 Essential (primary) hypertension: Secondary | ICD-10-CM

## 2024-01-10 DIAGNOSIS — I2510 Atherosclerotic heart disease of native coronary artery without angina pectoris: Secondary | ICD-10-CM

## 2024-01-10 NOTE — Progress Notes (Signed)
 Cardiology Office Note:  .   Date:  01/10/2024  ID:  BLESSEN KIMBROUGH, DOB [birth year], MRN [id number] PCP: Duanne Butler DASEN, MD  [facility] Providers Cardiologist:  Newman Lawrence, MD PCP: Duanne Butler DASEN, MD  Chief Complaint  Patient presents with   Coronary Artery Disease     Jessica Shaw is a 58 y.o. female with history of stroke and PFO, s/p PFO (2006), nonobstructive CAD, h/o DVT    Patient continues to have intermittent episodes of chest pain lasting for an entire day at a time, unrelated to exertion.  Reviewed recent coronary CT angiogram results with the patient, details below.  Vitals:   01/10/24 1600  BP: 116/64  Pulse: 67  SpO2: 95%       Review of Systems  Cardiovascular:  Positive for chest pain. Negative for dyspnea on exertion, leg swelling, palpitations and syncope.        Studies Reviewed: SABRA        EKG 01/10/2024: Normal sinus rhythm Normal ECG When compared with ECG of 24-Sep-2023 15:50, No significant change was found       Cor CTA 10/2023: 1. Coronary calcium  score of 59. This was 91st percentile for age-, sex, and race-matched controls.  2. Total plaque volume 151mm3 which is 71st percentile for age- and sex-matched controls (calcified plaque 51mm3; non-calcified plaque 1110mm3). TPV is moderate. 3.  Normal coronary origin with right dominance. 4.  Mild (25-49%) stenosis in proximal/mid LAD and D1 5. Minimal (0-24%) stenosis in left main, proximal RCA and distal RCA   RECOMMENDATIONS: CAD-RADS 2: Mild non-obstructive CAD (25-49%). Consider non-atherosclerotic causes of chest pain. Consider preventive therapy and risk factor modification.    Labs 09/2023: Chol 134, TG 174, HDL 49, LDL 60 Hb 14.1 Cr 0.6   Coronary angiography 03/14/2019: LM: Normal LAD: Mid LAD 50% and ostial Diag 40% stenosis at LAD/Diag bifurcation. LAD tapers rapidly in size after the bifurcation. dFR LAD: 0.90 Diag 0.97 LCx:  Normal RCA: Distal RCA 20% disease.   LVEDP normal.   Moderate nonobstructive epicardial CAD. Possible microvascular disease, as evidenced by slow TIMI 2 flow in all vessels. Recommend medical management.    Physical Exam Vitals and nursing note reviewed.  Constitutional:      General: She is not in acute distress. Neck:     Vascular: No JVD.  Cardiovascular:     Rate and Rhythm: Normal rate and regular rhythm.     Heart sounds: Normal heart sounds. No murmur heard. Pulmonary:     Effort: Pulmonary effort is normal.     Breath sounds: Normal breath sounds. No wheezing or rales.  Musculoskeletal:     Right lower leg: No edema.     Left lower leg: No edema.      VISIT DIAGNOSES:   ICD-10-CM   1. Mixed hyperlipidemia  E78.2 EKG 12-Lead    2. Essential hypertension  I10 EKG 12-Lead    3. Coronary artery disease of native artery of native heart with stable angina pectoris (HCC)  I25.118 EKG 12-Lead    4. Chest pain of uncertain etiology  R07.9 EKG 12-Lead        Jessica Shaw is a 58 y.o. female with  history of stroke and PFO, s/p closure (2006), nonobstructive CAD, h/o DVT  Assessment & Plan  Chest pain: Not typical of angina.  Recent coronary CT angiogram with mild nonobstructive CAD.  Echocardiogram in 10/2023 also with normal RV and LV function.  Overall,  even though patient has history of DVT, there is also low suspicion of PE.  No clear cardiac etiology noted.  I offered the patient to perform cardiac catheterization to assess microvascular function, but patient would like to hold off at this time.  Recommend follow-up with PCP.  Hypertension: Controlled   CAD: Mild, nonobstructive. Continue Aspirin , statin, amlodipine , metoprolol .    F/u in 1 year  Signed, Newman JINNY Lawrence, MD

## 2024-01-10 NOTE — Patient Instructions (Signed)
 Follow-Up: At [facility], you and your health needs are our priority.  As part of our continuing mission to provide you with exceptional heart care, our providers are all part of one team.  This team includes your primary Cardiologist (physician) and Advanced Practice Providers or APPs (Physician Assistants and Nurse Practitioners) who all work together to provide you with the care you need, when you need it.  Your next appointment:   1 year(s)  Provider:   Cody Das, MD

## 2024-01-17 ENCOUNTER — Other Ambulatory Visit: Payer: Self-pay | Admitting: Family Medicine

## 2024-01-26 DIAGNOSIS — Z0110 Encounter for examination of ears and hearing without abnormal findings: Secondary | ICD-10-CM | POA: Diagnosis not present

## 2024-01-28 ENCOUNTER — Other Ambulatory Visit: Payer: Self-pay | Admitting: Cardiology

## 2024-01-28 DIAGNOSIS — I2510 Atherosclerotic heart disease of native coronary artery without angina pectoris: Secondary | ICD-10-CM

## 2024-01-28 DIAGNOSIS — R0609 Other forms of dyspnea: Secondary | ICD-10-CM

## 2024-01-28 DIAGNOSIS — R9439 Abnormal result of other cardiovascular function study: Secondary | ICD-10-CM

## 2024-02-09 ENCOUNTER — Other Ambulatory Visit: Payer: Self-pay | Admitting: Gastroenterology

## 2024-02-09 DIAGNOSIS — K5900 Constipation, unspecified: Secondary | ICD-10-CM

## 2024-02-09 NOTE — Telephone Encounter (Signed)
 Sending in 53-month supply of refills, but she is due for an office visit.

## 2024-02-15 ENCOUNTER — Other Ambulatory Visit: Payer: Self-pay | Admitting: Family Medicine

## 2024-02-15 DIAGNOSIS — I25118 Atherosclerotic heart disease of native coronary artery with other forms of angina pectoris: Secondary | ICD-10-CM

## 2024-02-20 ENCOUNTER — Other Ambulatory Visit: Payer: Self-pay | Admitting: Family Medicine

## 2024-02-20 DIAGNOSIS — I25118 Atherosclerotic heart disease of native coronary artery with other forms of angina pectoris: Secondary | ICD-10-CM

## 2024-02-20 DIAGNOSIS — I700 Atherosclerosis of aorta: Secondary | ICD-10-CM

## 2024-02-20 DIAGNOSIS — E785 Hyperlipidemia, unspecified: Secondary | ICD-10-CM

## 2024-02-21 NOTE — Telephone Encounter (Signed)
 Requested Prescriptions  Pending Prescriptions Disp Refills   atorvastatin  (LIPITOR) 80 MG tablet [Pharmacy Med Name: Atorvastatin  Calcium  80 MG Oral Tablet] 90 tablet 1    Sig: TAKE 1 TABLET BY MOUTH ONCE DAILY AT  6  PM     Cardiovascular:  Antilipid - Statins Failed - 02/21/2024  3:30 PM      Failed - Lipid Panel in normal range within the last 12 months    Cholesterol, Total  Date Value Ref Range Status  10/02/2022 123 100 - 199 mg/dL Final   Cholesterol  Date Value Ref Range Status  09/20/2023 134 <200 mg/dL Final   LDL Cholesterol (Calc)  Date Value Ref Range Status  09/20/2023 60 mg/dL (calc) Final    Comment:    Reference range: <100 . Desirable range <100 mg/dL for primary prevention;   <70 mg/dL for patients with CHD or diabetic patients  with > or = 2 CHD risk factors. SABRA LDL-C is now calculated using the Martin-Hopkins  calculation, which is a validated novel method providing  better accuracy than the Friedewald equation in the  estimation of LDL-C.  Gladis APPLETHWAITE et al. SANDREA. 7986;689(80): 2061-2068  (http://education.Quest[employer]ostics.com/faq/FAQ164)    HDL  Date Value Ref Range Status  09/20/2023 49 (L) > OR = 50 mg/dL Final  95/80/7975 40 >60 mg/dL Final   Triglycerides  Date Value Ref Range Status  09/20/2023 174 (H) <150 mg/dL Final         Passed - Patient is not pregnant      Passed - Valid encounter within last 12 months    Recent Outpatient Visits           5 months ago General medical exam   [facility] Duanne Butler DASEN, MD   1 year ago Gastroenteritis   [place] [facility] Kayla Jeoffrey RAMAN, FNP   1 year ago Mass of upper inner quadrant of left breast   [place] [facility] Pickard, Butler DASEN, MD

## 2024-03-28 ENCOUNTER — Encounter: Payer: Self-pay | Admitting: Gastroenterology

## 2024-03-28 ENCOUNTER — Ambulatory Visit: Admitting: Gastroenterology

## 2024-03-28 VITALS — BP 120/80 | HR 71 | Temp 98.2°F | Ht 62.0 in | Wt 161.0 lb

## 2024-03-28 DIAGNOSIS — K5909 Other constipation: Secondary | ICD-10-CM

## 2024-03-28 DIAGNOSIS — K219 Gastro-esophageal reflux disease without esophagitis: Secondary | ICD-10-CM

## 2024-03-28 DIAGNOSIS — K5900 Constipation, unspecified: Secondary | ICD-10-CM | POA: Insufficient documentation

## 2024-03-28 MED ORDER — LINACLOTIDE 72 MCG PO CAPS: 72.0000 ug | ORAL_CAPSULE | Freq: Every day | ORAL | 3 refills | Status: AC

## 2024-03-28 NOTE — Progress Notes (Signed)
 GI Office Note    Referring Provider: Duanne Butler DASEN, MD Primary Care Physician:  Duanne Butler DASEN, MD  Primary Gastroenterologist: Ozell Hollingshead, MD   Chief Complaint   Chief Complaint  Patient presents with   Medication Refill    Needs refill on Linzess  . She needs a lower dose.     History of Present Illness   Jessica Shaw is a 58 y.o. female presenting today for follow up for refills. Last seen 01/2023. History of GERD, colonic tubular adenomas (due TCS in 2028), chronic constipation, remote bulbar ulcer disease (ASA powder use).  Discussed the use of AI scribe software for clinical note transcription with the patient, who gave verbal consent to proceed.   She finds the current dose of Linzess , 290 mcg, too strong, leading to severe diarrhea. This has caused her to leave work early twice. She initially tried daily use but now takes it sporadically, usually on Friday nights, to manage side effects. Sometimes able to take on Saturdays. She often does not take when she is out of town and she does travel a lot. Her mother is on Linzess  72mcg and patient would like to try this dose.  She denies melena, brbpr. No abdominal pain. Reflux well controlled. No dysphagia.    Prior Data     EGD 02/2023: -normal esophagus -one gastric polyp, fundic gland  Colonoscopy 01/2022: -diverticulosis -repeat colonoscopy 5 years  Last colonoscopy 02/09/22: Diverticulosis in the sigmoid colon, otherwise normal exam.  Recommended surveillance in 5 years.   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 Pathology with reactive gastropathy.    Medications   Current Outpatient Medications  Medication Sig Dispense Refill   amLODipine  (NORVASC ) 2.5 MG tablet Take 1 tablet by mouth once daily 90 tablet 0   aspirin  EC 81 MG tablet Take 81 mg by mouth daily. Swallow whole.     atorvastatin  (LIPITOR) 80 MG tablet TAKE 1 TABLET  BY MOUTH ONCE DAILY AT  6  PM 90 tablet 1   linaclotide  (LINZESS ) 290 MCG CAPS capsule Take 1 capsule (290 mcg total) by mouth daily before breakfast. 30 capsule 3   metoprolol  succinate (TOPROL -XL) 50 MG 24 hr tablet TAKE 1 TABLET BY MOUTH ONCE DAILY WITH  OR  IMMEDIATELY  FOLLOWING  A  MEAL. ( KEEP APPOINTMENT FOR REFILLS) 30 tablet 8   nitroGLYCERIN  (NITROSTAT ) 0.4 MG SL tablet Place 1 tablet (0.4 mg total) under the tongue every 5 (five) minutes as needed for chest pain. 75 tablet 1   Omega-3 Fatty Acids (FISH OIL) 1000 MG CAPS Take 1,000 mg by mouth daily.     omeprazole  (PRILOSEC) 40 MG capsule TAKE 1 CAPSULE BY MOUTH TWICE DAILY BEFORE A MEAL 180 capsule 0   isosorbide  mononitrate (IMDUR ) 30 MG 24 hr tablet Take 1 tablet by mouth once daily (Patient not taking: Reported on 03/28/2024) 30 tablet 0   No current facility-administered medications for this visit.    Allergies   Allergies as of 03/28/2024 - Review Complete 03/28/2024  Allergen Reaction Noted   Meperidine  hcl  09/24/2022   Morphine  09/24/2022   Morphine and codeine Other (See Comments) 10/16/2010   Demerol  [meperidine ]  11/12/2013   Latex Itching and Rash 10/16/2010      Review of Systems   General: Negative for anorexia, weight loss, fever, chills, fatigue, weakness. ENT: Negative for hoarseness, difficulty swallowing , nasal congestion. CV: Negative for  chest pain, angina, palpitations, dyspnea on exertion, peripheral edema.  Respiratory: Negative for dyspnea at rest, dyspnea on exertion, cough, sputum, wheezing.  GI: See history of present illness. GU:  Negative for dysuria, hematuria, urinary incontinence, urinary frequency, nocturnal urination.  Endo: Negative for unusual weight change.     Physical Exam   BP 120/80 (BP Location: Right Arm, Patient Position: Sitting, Cuff Size: Normal)   Pulse 71   Temp 98.2 F (36.8 C) (Temporal)   Ht 5' 2 (1.575 m)   Wt 161 lb (73 kg)   BMI 29.45 kg/m    General:  Well-nourished, well-developed in no acute distress.  Eyes: No icterus. Mouth: Oropharyngeal mucosa moist and pink   Abdomen: Bowel sounds are normal, nontender, nondistended, no hepatosplenomegaly or masses,  no abdominal bruits or hernia , no rebound or guarding.  Rectal: not performed Extremities: No lower extremity edema. No clubbing or deformities. Neuro: Alert and oriented x 4   Skin: Warm and dry, no jaundice.   Psych: Alert and cooperative, normal mood and affect.  Labs   Lab Results  Component Value Date   NA 139 09/20/2023   CL 105 09/20/2023   K 4.6 09/20/2023   CO2 27 09/20/2023   BUN 20 09/20/2023   CREATININE 0.60 09/20/2023   EGFR 103 01/07/2023   CALCIUM  9.8 09/20/2023   ALBUMIN 4.3 01/27/2019   GLUCOSE 100 (H) 09/20/2023   Lab Results  Component Value Date   WBC 9.0 09/20/2023   HGB 14.1 09/20/2023   HCT 41.7 09/20/2023   MCV 88.3 09/20/2023   PLT 293 09/20/2023   Lab Results  Component Value Date   ALT 23 09/20/2023   AST 18 09/20/2023   ALKPHOS 67 01/27/2019   BILITOT 0.5 09/20/2023    Imaging Studies   No results found.  Assessment/Plan:   Chronic constipation Linzess  290 mcg caused significant diarrhea, she is unable to take regularly.   - reduce dose to Linzess  72mcg daily.  - she will reach out for adjustments if medication not effectively controlling her symtpoms - return ov in one year.  GERD: -continue omeprazole  40mg , try to get by with once daily dose before breakfast but if has breakthrough symptoms, then continue twice daily - reinforced anti-reflux measures - return ov in one year     Sonny RAMAN. Ezzard, MHS, PA-C [facility]

## 2024-03-28 NOTE — Patient Instructions (Addendum)
 For acid reflux: try to get by with omeprazole  40mg  once daily before a meal if adequately controls symptoms. Can take second dose if needed.  For constipation: reduce Linzess  to 72mcg once daily on empty stomach. Let me know if you are still having issues with stools being too loose on Linzess  so that we can try alternatives.   Return office visit in one year or sooner if needed.

## 2024-05-16 ENCOUNTER — Other Ambulatory Visit: Payer: Self-pay | Admitting: Family Medicine

## 2024-05-16 DIAGNOSIS — I25118 Atherosclerotic heart disease of native coronary artery with other forms of angina pectoris: Secondary | ICD-10-CM

## 2024-06-04 ENCOUNTER — Other Ambulatory Visit: Payer: Self-pay | Admitting: Gastroenterology

## 2024-06-04 DIAGNOSIS — K5900 Constipation, unspecified: Secondary | ICD-10-CM

## 2024-09-21 ENCOUNTER — Encounter: Admitting: Family Medicine
